# Patient Record
Sex: Female | Born: 1970 | Race: Black or African American | Hispanic: No | Marital: Single | Smoking: Never smoker
Health system: Southern US, Community
[De-identification: ages and names within clinical notes are randomized; demographics above are authoritative.]

## PROBLEM LIST (undated history)

## (undated) DIAGNOSIS — D500 Iron deficiency anemia secondary to blood loss (chronic): Secondary | ICD-10-CM

## (undated) DIAGNOSIS — T8859XA Other complications of anesthesia, initial encounter: Secondary | ICD-10-CM

## (undated) DIAGNOSIS — M7502 Adhesive capsulitis of left shoulder: Secondary | ICD-10-CM

## (undated) DIAGNOSIS — Z87442 Personal history of urinary calculi: Secondary | ICD-10-CM

## (undated) DIAGNOSIS — D689 Coagulation defect, unspecified: Secondary | ICD-10-CM

## (undated) DIAGNOSIS — W3400XA Accidental discharge from unspecified firearms or gun, initial encounter: Secondary | ICD-10-CM

## (undated) DIAGNOSIS — I2699 Other pulmonary embolism without acute cor pulmonale: Secondary | ICD-10-CM

## (undated) DIAGNOSIS — S31139A Puncture wound of abdominal wall without foreign body, unspecified quadrant without penetration into peritoneal cavity, initial encounter: Secondary | ICD-10-CM

## (undated) DIAGNOSIS — E119 Type 2 diabetes mellitus without complications: Secondary | ICD-10-CM

## (undated) HISTORY — DX: Accidental discharge from unspecified firearms or gun, initial encounter: W34.00XA

## (undated) HISTORY — DX: Type 2 diabetes mellitus without complications: E11.9

## (undated) HISTORY — DX: Iron deficiency anemia secondary to blood loss (chronic): D50.0

## (undated) HISTORY — DX: Puncture wound of abdominal wall without foreign body, unspecified quadrant without penetration into peritoneal cavity, initial encounter: S31.139A

## (undated) HISTORY — DX: Coagulation defect, unspecified: D68.9

---
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
## 1997-10-23 ENCOUNTER — Emergency Department (HOSPITAL_COMMUNITY): Admission: EM | Admit: 1997-10-23 | Discharge: 1997-10-23 | Payer: Self-pay | Admitting: Emergency Medicine

## 1997-11-06 ENCOUNTER — Other Ambulatory Visit: Admission: RE | Admit: 1997-11-06 | Discharge: 1997-11-06 | Payer: Self-pay | Admitting: Gynecology

## 1998-12-17 ENCOUNTER — Other Ambulatory Visit: Admission: RE | Admit: 1998-12-17 | Discharge: 1998-12-17 | Payer: Self-pay | Admitting: Gynecology

## 1999-12-29 ENCOUNTER — Other Ambulatory Visit: Admission: RE | Admit: 1999-12-29 | Discharge: 1999-12-29 | Payer: Self-pay | Admitting: Gynecology

## 2001-01-16 ENCOUNTER — Other Ambulatory Visit: Admission: RE | Admit: 2001-01-16 | Discharge: 2001-01-16 | Payer: Self-pay | Admitting: Gynecology

## 2001-09-19 ENCOUNTER — Encounter: Admission: RE | Admit: 2001-09-19 | Discharge: 2001-09-19 | Payer: Self-pay | Admitting: Internal Medicine

## 2001-09-19 ENCOUNTER — Encounter: Payer: Self-pay | Admitting: Internal Medicine

## 2002-02-26 ENCOUNTER — Other Ambulatory Visit: Admission: RE | Admit: 2002-02-26 | Discharge: 2002-02-26 | Payer: Self-pay | Admitting: Gynecology

## 2002-07-02 ENCOUNTER — Other Ambulatory Visit: Admission: RE | Admit: 2002-07-02 | Discharge: 2002-07-02 | Payer: Self-pay | Admitting: Gynecology

## 2003-01-04 ENCOUNTER — Other Ambulatory Visit: Admission: RE | Admit: 2003-01-04 | Discharge: 2003-01-04 | Payer: Self-pay | Admitting: Gynecology

## 2003-05-15 ENCOUNTER — Other Ambulatory Visit: Admission: RE | Admit: 2003-05-15 | Discharge: 2003-05-15 | Payer: Self-pay | Admitting: Gynecology

## 2004-07-23 ENCOUNTER — Other Ambulatory Visit: Admission: RE | Admit: 2004-07-23 | Discharge: 2004-07-23 | Payer: Self-pay | Admitting: Gynecology

## 2005-08-12 ENCOUNTER — Other Ambulatory Visit: Admission: RE | Admit: 2005-08-12 | Discharge: 2005-08-12 | Payer: Self-pay | Admitting: Gynecology

## 2005-12-30 ENCOUNTER — Encounter: Admission: RE | Admit: 2005-12-30 | Discharge: 2005-12-30 | Payer: Self-pay | Admitting: Internal Medicine

## 2006-08-24 ENCOUNTER — Other Ambulatory Visit: Admission: RE | Admit: 2006-08-24 | Discharge: 2006-08-24 | Payer: Self-pay | Admitting: Obstetrics and Gynecology

## 2007-08-31 ENCOUNTER — Other Ambulatory Visit: Admission: RE | Admit: 2007-08-31 | Discharge: 2007-08-31 | Payer: Self-pay | Admitting: Obstetrics and Gynecology

## 2008-01-25 ENCOUNTER — Encounter: Admission: RE | Admit: 2008-01-25 | Discharge: 2008-01-25 | Payer: Self-pay | Admitting: Internal Medicine

## 2008-03-28 ENCOUNTER — Ambulatory Visit (HOSPITAL_COMMUNITY): Admission: RE | Admit: 2008-03-28 | Discharge: 2008-03-29 | Payer: Self-pay | Admitting: General Surgery

## 2008-03-28 ENCOUNTER — Encounter (INDEPENDENT_AMBULATORY_CARE_PROVIDER_SITE_OTHER): Payer: Self-pay | Admitting: General Surgery

## 2008-03-28 HISTORY — PX: LAPAROSCOPIC CHOLECYSTECTOMY: SUR755

## 2008-10-10 ENCOUNTER — Ambulatory Visit: Payer: Self-pay | Admitting: Obstetrics and Gynecology

## 2008-10-10 ENCOUNTER — Encounter: Payer: Self-pay | Admitting: Obstetrics and Gynecology

## 2008-10-10 ENCOUNTER — Other Ambulatory Visit: Admission: RE | Admit: 2008-10-10 | Discharge: 2008-10-10 | Payer: Self-pay | Admitting: Obstetrics and Gynecology

## 2009-10-13 ENCOUNTER — Other Ambulatory Visit: Admission: RE | Admit: 2009-10-13 | Discharge: 2009-10-13 | Payer: Self-pay | Admitting: Obstetrics and Gynecology

## 2009-10-13 ENCOUNTER — Ambulatory Visit: Payer: Self-pay | Admitting: Obstetrics and Gynecology

## 2010-06-09 LAB — CBC
HCT: 34.8 % — ABNORMAL LOW (ref 36.0–46.0)
Hemoglobin: 11.5 g/dL — ABNORMAL LOW (ref 12.0–15.0)
MCHC: 32.9 g/dL (ref 30.0–36.0)
MCV: 88.1 fL (ref 78.0–100.0)
Platelets: 236 10*3/uL (ref 150–400)
RBC: 3.95 MIL/uL (ref 3.87–5.11)
RDW: 13 % (ref 11.5–15.5)
WBC: 3 10*3/uL — ABNORMAL LOW (ref 4.0–10.5)

## 2010-06-09 LAB — DIFFERENTIAL
Basophils Absolute: 0 10*3/uL (ref 0.0–0.1)
Basophils Relative: 1 % (ref 0–1)
Eosinophils Absolute: 0.1 10*3/uL (ref 0.0–0.7)
Eosinophils Relative: 3 % (ref 0–5)
Lymphocytes Relative: 41 % (ref 12–46)
Lymphs Abs: 1.2 10*3/uL (ref 0.7–4.0)
Monocytes Absolute: 0.3 10*3/uL (ref 0.1–1.0)
Monocytes Relative: 9 % (ref 3–12)
Neutro Abs: 1.4 10*3/uL — ABNORMAL LOW (ref 1.7–7.7)
Neutrophils Relative %: 47 % (ref 43–77)

## 2010-06-09 LAB — COMPREHENSIVE METABOLIC PANEL
ALT: 12 U/L (ref 0–35)
AST: 16 U/L (ref 0–37)
Albumin: 3.7 g/dL (ref 3.5–5.2)
Alkaline Phosphatase: 89 U/L (ref 39–117)
BUN: 7 mg/dL (ref 6–23)
CO2: 27 mEq/L (ref 19–32)
Calcium: 9.4 mg/dL (ref 8.4–10.5)
Chloride: 110 mEq/L (ref 96–112)
Creatinine, Ser: 1.05 mg/dL (ref 0.4–1.2)
GFR calc Af Amer: >60 mL/min (ref >60)
GFR calc non Af Amer: 59 mL/min — ABNORMAL LOW (ref >60)
Glucose, Bld: 85 mg/dL (ref 70–99)
Potassium: 3.9 mEq/L (ref 3.5–5.1)
Sodium: 142 mEq/L (ref 135–145)
Total Bilirubin: 0.3 mg/dL (ref 0.3–1.2)
Total Protein: 6.7 g/dL (ref 6.0–8.3)

## 2010-06-09 LAB — PREGNANCY, URINE: Preg Test, Ur: NEGATIVE
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
## 2010-11-30 ENCOUNTER — Inpatient Hospital Stay (INDEPENDENT_AMBULATORY_CARE_PROVIDER_SITE_OTHER)
Admission: RE | Admit: 2010-11-30 | Discharge: 2010-11-30 | Disposition: A | Discharge status: Home / Self Care | Payer: Self-pay | Source: Ambulatory Visit | Attending: Family Medicine | Admitting: Family Medicine

## 2010-11-30 DIAGNOSIS — J069 Acute upper respiratory infection, unspecified: Secondary | ICD-10-CM

## 2011-09-28 ENCOUNTER — Other Ambulatory Visit (HOSPITAL_COMMUNITY): Payer: Self-pay | Admitting: Internal Medicine

## 2011-09-28 DIAGNOSIS — Z1231 Encounter for screening mammogram for malignant neoplasm of breast: Secondary | ICD-10-CM

## 2011-10-01 ENCOUNTER — Ambulatory Visit (HOSPITAL_COMMUNITY)
Admission: RE | Admit: 2011-10-01 | Discharge: 2011-10-01 | Disposition: A | Discharge status: Home / Self Care | Payer: Self-pay | Source: Ambulatory Visit | Attending: Internal Medicine | Admitting: Internal Medicine

## 2011-10-01 DIAGNOSIS — Z1231 Encounter for screening mammogram for malignant neoplasm of breast: Secondary | ICD-10-CM

## 2012-08-03 ENCOUNTER — Encounter: Payer: Self-pay | Admitting: Women's Health

## 2012-08-03 ENCOUNTER — Other Ambulatory Visit (HOSPITAL_COMMUNITY)
Admission: RE | Admit: 2012-08-03 | Discharge: 2012-08-03 | Disposition: A | Discharge status: Home / Self Care | Payer: Self-pay | Source: Ambulatory Visit | Attending: Gynecology | Admitting: Gynecology

## 2012-08-03 ENCOUNTER — Other Ambulatory Visit: Payer: Self-pay | Admitting: Women's Health

## 2012-08-03 ENCOUNTER — Ambulatory Visit (INDEPENDENT_AMBULATORY_CARE_PROVIDER_SITE_OTHER): Payer: No Typology Code available for payment source | Admitting: Women's Health

## 2012-08-03 VITALS — BP 116/72 | Ht 66.0 in | Wt 203.0 lb

## 2012-08-03 DIAGNOSIS — Z833 Family history of diabetes mellitus: Secondary | ICD-10-CM

## 2012-08-03 DIAGNOSIS — N870 Mild cervical dysplasia: Secondary | ICD-10-CM

## 2012-08-03 DIAGNOSIS — Z01419 Encounter for gynecological examination (general) (routine) without abnormal findings: Secondary | ICD-10-CM | POA: Insufficient documentation

## 2012-08-03 DIAGNOSIS — N852 Hypertrophy of uterus: Secondary | ICD-10-CM

## 2012-08-03 DIAGNOSIS — Z13220 Encounter for screening for lipoid disorders: Secondary | ICD-10-CM

## 2012-08-03 LAB — CBC WITH DIFFERENTIAL/PLATELET
Basophils Absolute: 0 10*3/uL (ref 0.0–0.1)
Basophils Relative: 1 % (ref 0–1)
Eosinophils Absolute: 0.1 10*3/uL (ref 0.0–0.7)
Eosinophils Relative: 2 % (ref 0–5)
HCT: 32.6 % — ABNORMAL LOW (ref 36.0–46.0)
Hemoglobin: 10.4 g/dL — ABNORMAL LOW (ref 12.0–15.0)
Lymphocytes Relative: 32 % (ref 12–46)
Lymphs Abs: 1.3 10*3/uL (ref 0.7–4.0)
MCH: 26.7 pg (ref 26.0–34.0)
MCHC: 31.9 g/dL (ref 30.0–36.0)
MCV: 83.6 fL (ref 78.0–100.0)
Monocytes Absolute: 0.3 10*3/uL (ref 0.1–1.0)
Monocytes Relative: 8 % (ref 3–12)
Neutro Abs: 2.3 10*3/uL (ref 1.7–7.7)
Neutrophils Relative %: 57 % (ref 43–77)
Platelets: 311 10*3/uL (ref 150–400)
RBC: 3.9 MIL/uL (ref 3.87–5.11)
RDW: 15.4 % (ref 11.5–15.5)
WBC: 3.9 10*3/uL — ABNORMAL LOW (ref 4.0–10.5)

## 2012-08-03 NOTE — Patient Instructions (Signed)
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
## 2012-08-03 NOTE — Addendum Note (Signed)
Addended by: Dayna Barker on: 08/03/2012 10:34 AM   Modules accepted: Orders

## 2012-08-04 ENCOUNTER — Other Ambulatory Visit: Payer: Self-pay | Admitting: Women's Health

## 2012-08-04 DIAGNOSIS — Z833 Family history of diabetes mellitus: Secondary | ICD-10-CM

## 2012-08-04 DIAGNOSIS — D649 Anemia, unspecified: Secondary | ICD-10-CM

## 2012-08-04 LAB — URINALYSIS W MICROSCOPIC + REFLEX CULTURE
Bacteria, UA: NONE SEEN
Bilirubin Urine: NEGATIVE
Casts: NONE SEEN
Crystals: NONE SEEN
Glucose, UA: NEGATIVE mg/dL
Ketones, ur: NEGATIVE mg/dL
Nitrite: NEGATIVE
Protein, ur: NEGATIVE mg/dL
Specific Gravity, Urine: 1.025 (ref 1.005–1.030)
Urobilinogen, UA: 0.2 mg/dL (ref 0.0–1.0)
pH: 6 (ref 5.0–8.0)

## 2012-08-04 LAB — HEMOGLOBIN A1C
Hgb A1c MFr Bld: 5.9 % — ABNORMAL HIGH (ref <5.7)
Mean Plasma Glucose: 123 mg/dL — ABNORMAL HIGH (ref <117)

## 2012-08-04 LAB — LIPID PANEL
Cholesterol: 149 mg/dL (ref 0–200)
HDL: 50 mg/dL (ref >39)
LDL Cholesterol: 81 mg/dL (ref 0–99)
Total CHOL/HDL Ratio: 3 Ratio
Triglycerides: 91 mg/dL (ref <150)
VLDL: 18 mg/dL (ref 0–40)

## 2012-08-04 LAB — GLUCOSE, RANDOM: Glucose, Bld: 131 mg/dL — ABNORMAL HIGH (ref 70–99)

## 2012-08-05 LAB — URINE CULTURE: Colony Count: 70000

## 2012-08-16 ENCOUNTER — Encounter: Payer: Self-pay | Admitting: Obstetrics and Gynecology

## 2012-09-05 ENCOUNTER — Other Ambulatory Visit: Payer: Self-pay | Admitting: Women's Health

## 2012-09-05 DIAGNOSIS — Z1231 Encounter for screening mammogram for malignant neoplasm of breast: Secondary | ICD-10-CM

## 2012-09-20 ENCOUNTER — Ambulatory Visit (INDEPENDENT_AMBULATORY_CARE_PROVIDER_SITE_OTHER): Payer: No Typology Code available for payment source | Admitting: Women's Health

## 2012-09-20 ENCOUNTER — Encounter: Payer: Self-pay | Admitting: Women's Health

## 2012-09-20 ENCOUNTER — Ambulatory Visit (INDEPENDENT_AMBULATORY_CARE_PROVIDER_SITE_OTHER): Payer: No Typology Code available for payment source

## 2012-09-20 DIAGNOSIS — D251 Intramural leiomyoma of uterus: Secondary | ICD-10-CM

## 2012-09-20 DIAGNOSIS — N839 Noninflammatory disorder of ovary, fallopian tube and broad ligament, unspecified: Secondary | ICD-10-CM

## 2012-09-20 DIAGNOSIS — N852 Hypertrophy of uterus: Secondary | ICD-10-CM

## 2012-09-20 DIAGNOSIS — N8300 Follicular cyst of ovary, unspecified side: Secondary | ICD-10-CM

## 2012-09-20 DIAGNOSIS — D259 Leiomyoma of uterus, unspecified: Secondary | ICD-10-CM

## 2012-09-20 DIAGNOSIS — N920 Excessive and frequent menstruation with regular cycle: Secondary | ICD-10-CM

## 2012-09-20 DIAGNOSIS — R9389 Abnormal findings on diagnostic imaging of other specified body structures: Secondary | ICD-10-CM

## 2012-09-20 NOTE — Progress Notes (Signed)
Patient ID: Marissa [name], female   DOB: [birth year], 42 y.o.   MRN: [id number] Presents for ultrasound. At annual exam complaint of menorrhagia. Denies bleeding between cycles.  Ultrasound: Enlarged uterus with intramural fibroids 15 x 7 mm, 15 x 14 mm. Endometrium try layered with solid focus 15 x 7 mm vascular. Right ovary normal. Left ovary thin walled septated cystic mass 31 x 23 x 27. Septum 3 mm. Negative cul-de-sac.  Probable endometrial polyp Left ovarian cyst Uterine fibroids  Plan: Schedule sonohysterogram with Dr. Lily Peer after next cycle. Continue healthy lifestyle changes of walking and decreasing calories for weight loss.

## 2012-09-20 NOTE — Patient Instructions (Addendum)

## 2012-09-25 ENCOUNTER — Emergency Department (INDEPENDENT_AMBULATORY_CARE_PROVIDER_SITE_OTHER)
Admission: EM | Admit: 2012-09-25 | Discharge: 2012-09-25 | Disposition: A | Discharge status: Home / Self Care | Payer: No Typology Code available for payment source | Source: Home / Self Care

## 2012-09-25 ENCOUNTER — Encounter (HOSPITAL_COMMUNITY): Payer: Self-pay | Admitting: Emergency Medicine

## 2012-09-25 DIAGNOSIS — M545 Low back pain, unspecified: Secondary | ICD-10-CM

## 2012-09-25 MED ORDER — MELOXICAM 15 MG PO TABS: 15.0000 mg | ORAL_TABLET | Freq: Every day | ORAL | Status: DC | PRN

## 2012-09-25 MED ORDER — CYCLOBENZAPRINE HCL 10 MG PO TABS: 10.0000 mg | ORAL_TABLET | Freq: Every evening | ORAL | Status: DC | PRN

## 2012-09-25 NOTE — ED Notes (Signed)
Reports back pain since 09/22/12.  Patient reports she was in the shower and sneezed.  She felt immediate pain.  Pain in center low back radiating into right hip and top of thigh.  Pain worsening. Patient reports a fall in the winter, but no back issues like this.

## 2012-09-25 NOTE — ED Provider Notes (Signed)
Marissa [name] is a 42 y.o. female who presents to Urgent Care today for right low back pain occurring for the last several days. The pain developed after a violent sneeze. She notes pain is in her right lower back and does not radiate beyond her lateral thigh. She denies any weakness numbness pain radiating below the level of her knee trouble walking or bowel bladder dysfunction. She has tried some ibuprofen which is only been somewhat helpful. She feels well otherwise.    PMH reviewed. Healthy otherwise History  Substance Use Topics  . Smoking status: Never Smoker   . Smokeless tobacco: Not on file  . Alcohol Use: No   ROS as above no fevers chills nausea vomiting or diarrhea Medications reviewed. No current facility-administered medications for this encounter.   Current Outpatient Prescriptions  Medication Sig Dispense Refill  . cyclobenzaprine (FLEXERIL) 10 MG tablet Take 1 tablet (10 mg total) by mouth at bedtime as needed for muscle spasms.  20 tablet  0  . meloxicam (MOBIC) 15 MG tablet Take 1 tablet (15 mg total) by mouth daily as needed for pain.  30 tablet  0    Exam:  BP 120/78  Pulse 77  Temp(Src) 98.6 F (37 C) (Oral)  Resp 18  SpO2 100%  LMP 09/08/2012 Gen: Well NAD HEENT: EOMI,  MMM Lungs: CTABL Nl WOB Heart: RRR no MRG Abd: NABS, NT, ND Exts: Non edematous BL  LE, warm and well perfused.  Back: Nontender spinal midline.  Tender palpation right lumbar paraspinal muscles. Range of motion is decreased flexion normal extension. Negative straight leg raise test capillary refill and sensation are intact bilateral lower extremity. Strength is intact patient can stand on her toes and heels and get on and off exam table by herself. She has a normal gait.  No results found for this or any previous visit (from the past 24 hour(s)). No results found.  Assessment and Plan: 42 y.o. female with lumbar myofascial pain.  Plan to treat symptomatically with meloxicam and  Flexeril. We'll use a heating pad as well. Home exercise program. If not improving will followup with Dr. Katrinka Blazing. Discussed warning signs or symptoms. Please see discharge instructions. Patient expresses understanding.      Rodolph Bong, MD 09/25/12 1145

## 2012-09-26 ENCOUNTER — Other Ambulatory Visit: Payer: Self-pay | Admitting: Gynecology

## 2012-09-26 DIAGNOSIS — N920 Excessive and frequent menstruation with regular cycle: Secondary | ICD-10-CM

## 2012-10-05 ENCOUNTER — Ambulatory Visit (HOSPITAL_COMMUNITY): Payer: Self-pay

## 2012-10-05 ENCOUNTER — Ambulatory Visit (HOSPITAL_COMMUNITY)
Admission: RE | Admit: 2012-10-05 | Discharge: 2012-10-05 | Disposition: A | Discharge status: Home / Self Care | Payer: No Typology Code available for payment source | Source: Ambulatory Visit | Attending: Women's Health | Admitting: Women's Health

## 2012-10-05 DIAGNOSIS — Z1231 Encounter for screening mammogram for malignant neoplasm of breast: Secondary | ICD-10-CM

## 2012-10-19 ENCOUNTER — Other Ambulatory Visit: Payer: Self-pay | Admitting: Gynecology

## 2012-10-19 ENCOUNTER — Ambulatory Visit (INDEPENDENT_AMBULATORY_CARE_PROVIDER_SITE_OTHER): Payer: No Typology Code available for payment source | Admitting: Gynecology

## 2012-10-19 ENCOUNTER — Ambulatory Visit (INDEPENDENT_AMBULATORY_CARE_PROVIDER_SITE_OTHER): Payer: No Typology Code available for payment source

## 2012-10-19 DIAGNOSIS — D259 Leiomyoma of uterus, unspecified: Secondary | ICD-10-CM

## 2012-10-19 DIAGNOSIS — O9989 Other specified diseases and conditions complicating pregnancy, childbirth and the puerperium: Secondary | ICD-10-CM

## 2012-10-19 DIAGNOSIS — N920 Excessive and frequent menstruation with regular cycle: Secondary | ICD-10-CM

## 2012-10-19 DIAGNOSIS — Z833 Family history of diabetes mellitus: Secondary | ICD-10-CM

## 2012-10-19 DIAGNOSIS — O3411 Maternal care for benign tumor of corpus uteri, first trimester: Secondary | ICD-10-CM

## 2012-10-19 DIAGNOSIS — O3410 Maternal care for benign tumor of corpus uteri, unspecified trimester: Secondary | ICD-10-CM

## 2012-10-19 DIAGNOSIS — N912 Amenorrhea, unspecified: Secondary | ICD-10-CM

## 2012-10-19 DIAGNOSIS — Z3490 Encounter for supervision of normal pregnancy, unspecified, unspecified trimester: Secondary | ICD-10-CM | POA: Insufficient documentation

## 2012-10-19 DIAGNOSIS — O09529 Supervision of elderly multigravida, unspecified trimester: Secondary | ICD-10-CM

## 2012-10-19 DIAGNOSIS — D649 Anemia, unspecified: Secondary | ICD-10-CM

## 2012-10-19 LAB — CBC WITH DIFFERENTIAL/PLATELET
Basophils Absolute: 0 10*3/uL (ref 0.0–0.1)
Basophils Relative: 0 % (ref 0–1)
Eosinophils Absolute: 0.1 10*3/uL (ref 0.0–0.7)
Eosinophils Relative: 2 % (ref 0–5)
HCT: 30.4 % — ABNORMAL LOW (ref 36.0–46.0)
Hemoglobin: 9.8 g/dL — ABNORMAL LOW (ref 12.0–15.0)
Lymphocytes Relative: 26 % (ref 12–46)
Lymphs Abs: 1.3 10*3/uL (ref 0.7–4.0)
MCH: 26.9 pg (ref 26.0–34.0)
MCHC: 32.2 g/dL (ref 30.0–36.0)
MCV: 83.5 fL (ref 78.0–100.0)
Monocytes Absolute: 0.5 10*3/uL (ref 0.1–1.0)
Monocytes Relative: 9 % (ref 3–12)
Neutro Abs: 3.3 10*3/uL (ref 1.7–7.7)
Neutrophils Relative %: 63 % (ref 43–77)
Platelets: 314 10*3/uL (ref 150–400)
RBC: 3.64 MIL/uL — ABNORMAL LOW (ref 3.87–5.11)
RDW: 16.2 % — ABNORMAL HIGH (ref 11.5–15.5)
WBC: 5.2 10*3/uL (ref 4.0–10.5)

## 2012-10-19 LAB — US OB TRANSVAGINAL

## 2012-10-19 LAB — PREGNANCY, URINE: Preg Test, Ur: POSITIVE

## 2012-10-19 NOTE — Progress Notes (Signed)
Patient is a 42 year old now gravida 1 para 0 who was seen in the office on June 22 for annual exam and was complaining of heavy periods. She had been using condoms. She return to the office on July 30 where an ultrasound was done as a result of her menorrhagia and was found to have a left ovarian cyst measuring 31 x 23 x 27 mm with a 3 mm septum. A solid focus measuring 15 x 7 mm with vascularity was noted.   Patient did a home pregnancy test which was positive and one was done here in the office today which confirmed that she indeed is pregnant. This visit was previously scheduled for a sonohysterogram for further evaluation of her menorrhagia and it appeared that she may have had a submucous myoma. Surprisingly she is pregnant so the sonohysterogram was not done. Patient denied any nausea vomiting or any vaginal bleeding. Patient currently on no medication and has a negative medical history. She is on no medication.  Ultrasound today: Anteverted uterus with intrauterine pregnancy seen within the uterine cavity. Gestational sac consistent with 5 weeks and 2 days. There was no evidence of a fetal pole, cardiac activity or yolk sac. Right ovary was normal. Left ovarian corpus luteum cyst was noted measuring  20 x 27 [email or web address] fibroids as follows left 16 x 15 mm, 21 x 24 mm. There was no free fluid in the cul-de-sac.  Assessment/plan: Advanced maternal age first pregnancy first trimester early gestational sac no fetal pole, cardiac activity or yolk sac seen. Patient asymptomatic. Quantitative beta hCG will be drawn today and then repeat in 1 week and on September 11 to return back for an ultrasound for viability. She will be started on prenatal vitamins today. The risk involved in an advanced maternal age were discussed with the patient to include colon aneuploidy, hypertension, diabetes, premature delivery, as well as intrauterine growth restriction.

## 2012-10-19 NOTE — Patient Instructions (Addendum)

## 2012-10-20 LAB — HCG, QUANTITATIVE, PREGNANCY: hCG, Beta Chain, Quant, S: 9216.6 m[IU]/mL

## 2012-10-20 LAB — HEMOGLOBIN A1C
Hgb A1c MFr Bld: 6 % — ABNORMAL HIGH (ref <5.7)
Mean Plasma Glucose: 126 mg/dL — ABNORMAL HIGH (ref <117)

## 2012-11-01 ENCOUNTER — Ambulatory Visit (INDEPENDENT_AMBULATORY_CARE_PROVIDER_SITE_OTHER): Payer: No Typology Code available for payment source | Admitting: Gynecology

## 2012-11-01 ENCOUNTER — Encounter: Payer: Self-pay | Admitting: Gynecology

## 2012-11-01 VITALS — BP 128/76

## 2012-11-01 DIAGNOSIS — N912 Amenorrhea, unspecified: Secondary | ICD-10-CM

## 2012-11-01 DIAGNOSIS — O200 Threatened abortion: Secondary | ICD-10-CM | POA: Insufficient documentation

## 2012-11-01 DIAGNOSIS — Z3490 Encounter for supervision of normal pregnancy, unspecified, unspecified trimester: Secondary | ICD-10-CM

## 2012-11-01 NOTE — Patient Instructions (Signed)
Threatened Miscarriage  Bleeding during the first 20 weeks of pregnancy is common. This is sometimes called a threatened miscarriage. This is a pregnancy that is threatening to end before the twentieth week of pregnancy. Often this bleeding stops with bed rest or decreased activities as suggested by your caregiver and the pregnancy continues without any more problems. You may be asked to not have sexual intercourse, have orgasms or use tampons until further notice. Sometimes a threatened miscarriage can progress to a complete or incomplete miscarriage. This may or may not require further treatment. Some miscarriages occur before a woman misses a menstrual period and knows she is pregnant.  Miscarriages occur in 15 to 20% of all pregnancies and usually occur during the first 13 weeks of the pregnancy. The exact cause of a miscarriage is usually never known. A miscarriage is natures way of ending a pregnancy that is abnormal or would not make it to term. There are some things that may put you at risk to have a miscarriage, such as:  · Hormone problems.  · Infection of the uterus or cervix.  · Chronic illness, diabetes for example, especially if it is not controlled.  · Abnormal shaped uterus.  · Fibroids in the uterus.  · Incompetent cervix (the cervix is too weak to hold the baby).  · Smoking.  · Drinking too much alcohol. It's best not to drink any alcohol when you are pregnant.  · Taking illegal drugs.  TREATMENT   When a miscarriage becomes complete and all products of conception (all the tissue in the uterus) have been passed, often no treatment is needed. If you think you passed tissue, save it in a container and take it to your doctor for evaluation. If the miscarriage is incomplete (parts of the fetus or placenta remain in the uterus), further treatment may be needed. The most common reason for further treatment is continued bleeding (hemorrhage) because pregnancy tissue did not pass out of the uterus. This  often occurs if a miscarriage is incomplete. Tissue left behind may also become infected. Treatment usually is dilatation and curettage (the removal of the remaining products of pregnancy. This can be done by a simple sucking procedure (suction curettage) or a simple scraping of the inside of the uterus. This may be done in the hospital or in the caregiver's office. This is only done when your caregiver knows that there is no chance for the pregnancy to proceed to term. This is determined by physical examination, negative pregnancy test, falling pregnancy hormone count and/or, an ultrasound revealing a dead fetus.  Miscarriages are often a very emotional time for prospective mothers and fathers. This is not you or your partners fault. It did not occur because of an inadequacy in you or your partner. Nearly all miscarriages occur because the pregnancy has started off wrongly. At least half of these pregnancies have a chromosomal abnormality. It is almost always not inherited. Others may have developmental problems with the fetus or placenta. This does not always show up even when the products miscarried are studied under the microscope. The miscarriage is nearly always not your fault and it is not likely that you could have prevented it from happening. If you are having emotional and grieving problems, talk to your health care provider and even seek counseling, if necessary, before getting pregnant again. You can begin trying for another pregnancy as soon as your caregiver says it is OK.  HOME CARE INSTRUCTIONS   · Your caregiver may order   bed rest depending on how much bleeding and cramping you are having. You may be limited to only getting up to go to the bathroom. You may be allowed to continue light activity. You may need to make arrangements for the care of your other children and for any other responsibilities.  · Keep track of the number of pads you use each day, how often you have to change pads and how  saturated (soaked) they are. Record this information.  · DO NOT USE TAMPONS. Do not douche, have sexual intercourse or orgasms until approved by your caregiver.  · You may receive a follow up appointment for re-evaluation of your pregnancy and a repeat blood test. Re-evaluation often occurs after 2 days and again in 4 to 6 weeks. It is very important that you follow-up in the recommended time period.  · If you are Rh negative and the father is Rh positive or you do not know the fathers' blood type, you may receive a shot (Rh immune globulin) to help prevent abnormal antibodies that can develop and affect the baby in any future pregnancies.  SEEK IMMEDIATE MEDICAL CARE IF:  · You have severe cramps in your stomach, back, or abdomen.  · You have a sudden onset of severe pain in the lower part of your abdomen.  · You develop chills.  · You run an unexplained temperature of 101° F (38.3° C) or higher.  · You pass large clots or tissue. Save any tissue for your caregiver to inspect.  · Your bleeding increases or you become light-headed, weak, or have fainting episodes.  · You have a gush of fluid from your vagina.  · You pass out. This could mean you have a tubal (ectopic) pregnancy.  Document Released: 02/08/2005 Document Revised: 05/03/2011 Document Reviewed: 09/25/2007  Exit[employer]® Patient Information ©2014 [employer].

## 2012-11-01 NOTE — Progress Notes (Signed)
Patient is a 42 year old now gravida 1 para 0 who was seen in the office on June 22 for annual exam and was complaining of heavy periods. She had been using condoms. She return to the office on July 30 where an ultrasound was done as a result of her menorrhagia and was found to have a left ovarian cyst measuring 31 x 23 x 27 mm with a 3 mm septum. A solid focus measuring 15 x 7 mm with vascularity was noted.   Patient did a home pregnancy test which was positive and one Done in the office on 10/19/2012 which confirmed that she indeed is pregnant. This visit was previously scheduled for a sonohysterogram for further evaluation of her menorrhagia and it appeared that she may have had a submucous myoma. Surprisingly she was found to be pregnant so the sonohysterogram was not done. Patient denied any nausea vomiting or any vaginal bleeding. Patient currently on no medication and has a negative medical history. She is on no medication.   Ultrasound 10/19/2012: Anteverted uterus with intrauterine pregnancy seen within the uterine cavity. Gestational sac consistent with 5 weeks and 2 days. There was no evidence of a fetal pole, cardiac activity or yolk sac. Right ovary was normal. Left ovarian corpus luteum cyst was noted measuring 20 x 27 [email or web address] fibroids as follows left 16 x 15 mm, 21 x 24 mm. There was no free fluid in the cul-de-sac.   Patient presented to the office today stating that last night she noted some pinkish discharge when she was wiping. She denied any heavy bleeding or cramping. Some nausea but no vomiting.  Exam: Abdomen: Soft nontender no rebound or guarding Pelvic: And urethra Skene was within normal limits Vagina: No lesions slight amount of brownish discharge Cervix: Long closed with no lesions or discharge Uterus: 6-to 8 week size Adnexa: A viable mass or tenderness Rectal exam: Not done  Quantitative beta-hCG on 10/19/2012 with 9260 M IU  Assessment/plan: First trimester  threatened AB. Quantitative beta hCG will be drawn today. Patient for followup ultrasound next week. Early ultrasound confirmed gestational sac in utero. Threatened AB precautions provided.

## 2012-11-02 LAB — HCG, QUANTITATIVE, PREGNANCY: hCG, Beta Chain, Quant, S: 52948.3 m[IU]/mL

## 2012-11-09 ENCOUNTER — Ambulatory Visit (INDEPENDENT_AMBULATORY_CARE_PROVIDER_SITE_OTHER): Payer: No Typology Code available for payment source

## 2012-11-09 ENCOUNTER — Encounter: Payer: Self-pay | Admitting: Gynecology

## 2012-11-09 ENCOUNTER — Ambulatory Visit (INDEPENDENT_AMBULATORY_CARE_PROVIDER_SITE_OTHER): Payer: No Typology Code available for payment source | Admitting: Gynecology

## 2012-11-09 VITALS — BP 128/74

## 2012-11-09 DIAGNOSIS — N831 Corpus luteum cyst of ovary, unspecified side: Secondary | ICD-10-CM

## 2012-11-09 DIAGNOSIS — O200 Threatened abortion: Secondary | ICD-10-CM

## 2012-11-09 DIAGNOSIS — Z3490 Encounter for supervision of normal pregnancy, unspecified, unspecified trimester: Secondary | ICD-10-CM

## 2012-11-09 DIAGNOSIS — O09511 Supervision of elderly primigravida, first trimester: Secondary | ICD-10-CM

## 2012-11-09 DIAGNOSIS — O9989 Other specified diseases and conditions complicating pregnancy, childbirth and the puerperium: Secondary | ICD-10-CM

## 2012-11-09 DIAGNOSIS — N912 Amenorrhea, unspecified: Secondary | ICD-10-CM

## 2012-11-09 LAB — US OB TRANSVAGINAL

## 2012-11-09 NOTE — Progress Notes (Signed)
Patient is a 42 year old gravida 1 para 0 who was last seen in the office on September 10 when it was noted that she was pregnant and a sonohysterogram was canceled as part of her workup for menorrhagia and fibroid uterus.  Ultrasound 10/19/2012: Anteverted uterus with intrauterine pregnancy seen within the uterine cavity. Gestational sac consistent with 5 weeks and 2 days. There was no evidence of a fetal pole, cardiac activity or yolk sac. Right ovary was normal. Left ovarian corpus luteum cyst was noted measuring 20 x 27 [email or web address] fibroids as follows left 16 x 15 mm, 21 x 24 mm. There was no free fluid in the cul-de-sac.   Patient had a fall in quantitative beta-hCGs:  Results for Marissa, [name] (MRN [id number]) as of 11/09/2012 16:08  Ref. Range 10/19/2012 16:07 10/19/2012 16:20 11/01/2012 10:19  hCG, Beta Chain, Quant, S No range found 9216.6  52948.3   She presented to the office today for ultrasound followup. She has some mild nausea but otherwise been doing well. Ultrasound today as follows: Intrauterine pregnancy seen within the endometrial cavity. Gestational sac size less than dates by 2 weeks. Yolk sac was seen measuring 3 mm. Normal shape and size for gestational age. No cardiac activity seen and no fetal pole was seen. Right ovary normal. Left ovary corpus luteum cyst was seen. Cervix long and closed.  Assessment/plan: First trimester intrauterine pregnancy normal gestational sac and yolk sac no fetal pole but rising quantitative beta-hCGs. Patient by ultrasound today 6 weeks and 5 days. A quantitative beta hCG will be drawn today. Patient will return back to the office in 2 weeks for followup. She will continue on her prenatal vitamin.

## 2012-11-10 ENCOUNTER — Telehealth: Payer: Self-pay | Admitting: *Deleted

## 2012-11-10 LAB — HCG, QUANTITATIVE, PREGNANCY: hCG, Beta Chain, Quant, S: 63411.3 m[IU]/mL

## 2012-11-10 NOTE — Telephone Encounter (Signed)
Pt called requesting beta HCG result on 11/09/12, I told her you were out of the office and to keep scheduled ultrasound appointment on 11/22/12. Please advise

## 2012-11-13 NOTE — Telephone Encounter (Signed)
See result note.  

## 2012-11-22 ENCOUNTER — Encounter: Payer: Self-pay | Admitting: Gynecology

## 2012-11-22 ENCOUNTER — Ambulatory Visit (INDEPENDENT_AMBULATORY_CARE_PROVIDER_SITE_OTHER): Payer: No Typology Code available for payment source

## 2012-11-22 ENCOUNTER — Ambulatory Visit (INDEPENDENT_AMBULATORY_CARE_PROVIDER_SITE_OTHER): Payer: No Typology Code available for payment source | Admitting: Gynecology

## 2012-11-22 DIAGNOSIS — O200 Threatened abortion: Secondary | ICD-10-CM

## 2012-11-22 DIAGNOSIS — O021 Missed abortion: Secondary | ICD-10-CM

## 2012-11-22 DIAGNOSIS — O09521 Supervision of elderly multigravida, first trimester: Secondary | ICD-10-CM

## 2012-11-22 LAB — US OB TRANSVAGINAL

## 2012-11-22 LAB — HCG, QUANTITATIVE, PREGNANCY: hCG, Beta Chain, Quant, S: 30731.9 m[IU]/mL

## 2012-11-22 NOTE — Progress Notes (Signed)
Ultrasound 10/19/2012:  Anteverted uterus with intrauterine pregnancy seen within the uterine cavity. Gestational sac consistent with 5 weeks and 2 days. There was no evidence of a fetal pole, cardiac activity or yolk sac. Right ovary was normal. Left ovarian corpus luteum cyst was noted measuring 20 x 27 [email or web address] fibroids as follows left 16 x 15 mm, 21 x 24 mm. There was no free fluid in the cul-de-sac.   She return to the office for followup ultrasound on September 18 which demonstrated the following: Intrauterine pregnancy seen within the endometrial cavity. Gestational sac size less than dates by 2 weeks. Yolk sac was seen measuring 3 mm. Normal shape and size for gestational age. No cardiac activity seen and no fetal pole was seen. Right ovary normal. Left ovary corpus luteum cyst was seen. Cervix long and closed  She was diagnosed with first trimester threatened AB and had quantitative beta-hCGs as follows: Results for CHRISTOPHER, HINK (MRN [id number]) as of 11/22/2012 09:38  Ref. Range 10/19/2012 16:07 11/01/2012 10:19 11/09/2012 15:29  hCG, Beta Chain, Quant, S No range found 9216.6 52948.3 63411.3    Today she presented for an ultrasound once again the ultrasound with the following findings: Anteverted uterus with gestational sac seen in the fundus. Yolk sac was seen measuring 13.0 mm greater than the 95th percentile for gestational age. Fetal pole 2.9 consistent with 5 weeks and 5 days. No fetal cardiac activity was seen. Right ovary normal. Left ovary corpus luteum cyst was seen cervix is long closed.  Assessment/plan: Patient with first trimester threatened AB slowly rising quantitative beta-hCGs fetal pole previously not seen now visualize but no cardiac activity. We'll check a quantitative beta-hCG to monitor the trans-and if continues to right she will return for one final ultrasound in 2 weeks. Patient is otherwise doing well all the above was discussed and explained in detail and all  questions were answered. First trimester precautions were discussed as well.

## 2012-11-24 ENCOUNTER — Encounter (HOSPITAL_BASED_OUTPATIENT_CLINIC_OR_DEPARTMENT_OTHER): Payer: Self-pay | Admitting: *Deleted

## 2012-11-24 NOTE — Progress Notes (Signed)
NPO AFTER MN WITH EXCEPTION WATER / GATORADE UNTIL 0700.  ARRIVES AT 1100. NEEDS CBC AND UA. MAY TAKE TYLENOL IF NEEDED W/ SIPS OF WATER.
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
## 2012-11-27 ENCOUNTER — Ambulatory Visit (INDEPENDENT_AMBULATORY_CARE_PROVIDER_SITE_OTHER): Payer: No Typology Code available for payment source | Admitting: Gynecology

## 2012-11-27 ENCOUNTER — Encounter: Payer: Self-pay | Admitting: Gynecology

## 2012-11-27 VITALS — BP 124/76

## 2012-11-27 DIAGNOSIS — O021 Missed abortion: Secondary | ICD-10-CM

## 2012-11-27 MED ORDER — KETOROLAC TROMETHAMINE 10 MG PO TABS: 10.0000 mg | ORAL_TABLET | Freq: Four times a day (QID) | ORAL | Status: DC | PRN

## 2012-11-27 MED ORDER — METOCLOPRAMIDE HCL 10 MG PO TABS: 10.0000 mg | ORAL_TABLET | Freq: Three times a day (TID) | ORAL | Status: DC

## 2012-11-27 MED ORDER — IBUPROFEN 800 MG PO TABS: 800.0000 mg | ORAL_TABLET | Freq: Three times a day (TID) | ORAL | Status: DC | PRN

## 2012-11-27 NOTE — Patient Instructions (Signed)
Miscarriage A miscarriage is the sudden loss of an unborn baby (fetus) before the 20th week of pregnancy. Most miscarriages happen in the first 3 months of pregnancy. Sometimes, it happens before a woman even knows she is pregnant. A miscarriage is also called a "spontaneous miscarriage" or "early pregnancy loss." Having a miscarriage can be an emotional experience. Talk with your caregiver about any questions you may have about miscarrying, the grieving process, and your future pregnancy plans. CAUSES   Problems with the fetal chromosomes that make it impossible for the baby to develop normally. Problems with the baby's genes or chromosomes are most often the result of errors that occur, by chance, as the embryo divides and grows. The problems are not inherited from the parents.  Infection of the cervix or uterus.   Hormone problems.   Problems with the cervix, such as having an incompetent cervix. This is when the tissue in the cervix is not strong enough to hold the pregnancy.   Problems with the uterus, such as an abnormally shaped uterus, uterine fibroids, or congenital abnormalities.   Certain medical conditions.   Smoking, drinking alcohol, or taking illegal drugs.   Trauma.  Often, the cause of a miscarriage is unknown.  SYMPTOMS   Vaginal bleeding or spotting, with or without cramps or pain.  Pain or cramping in the abdomen or lower back.  Passing fluid, tissue, or blood clots from the vagina. DIAGNOSIS  Your caregiver will perform a physical exam. You may also have an ultrasound to confirm the miscarriage. Blood or urine tests may also be ordered. TREATMENT   Sometimes, treatment is not necessary if you naturally pass all the fetal tissue that was in the uterus. If some of the fetus or placenta remains in the body (incomplete miscarriage), tissue left behind may become infected and must be removed. Usually, a dilation and curettage (D and C) procedure is performed.  During a D and C procedure, the cervix is widened (dilated) and any remaining fetal or placental tissue is gently removed from the uterus.  Antibiotic medicines are prescribed if there is an infection. Other medicines may be given to reduce the size of the uterus (contract) if there is a lot of bleeding.  If you have Rh negative blood and your baby was Rh positive, you will need a Rh immunoglobulin shot. This shot will protect any future baby from having Rh blood problems in future pregnancies. HOME CARE INSTRUCTIONS   Your caregiver may order bed rest or may allow you to continue light activity. Resume activity as directed by your caregiver.  Have someone help with home and family responsibilities during this time.   Keep track of the number of sanitary pads you use each day and how soaked (saturated) they are. Write down this information.   Do not use tampons. Do not douche or have sexual intercourse until approved by your caregiver.   Only take over-the-counter or prescription medicines for pain or discomfort as directed by your caregiver.   Do not take aspirin. Aspirin can cause bleeding.   Keep all follow-up appointments with your caregiver.   If you or your partner have problems with grieving, talk to your caregiver or seek counseling to help cope with the pregnancy loss. Allow enough time to grieve before trying to get pregnant again.  SEEK IMMEDIATE MEDICAL CARE IF:   You have severe cramps or pain in your back or abdomen.  You have a fever.  You pass large blood clots (walnut-sized   or larger) ortissue from your vagina. Save any tissue for your caregiver to inspect.   Your bleeding increases.   You have a thick, bad-smelling vaginal discharge.  You become lightheaded, weak, or you faint.   You have chills.  MAKE SURE YOU:  Understand these instructions.  Will watch your condition.  Will get help right away if you are not doing well or get  worse. Document Released: 08/04/2000 Document Revised: 08/10/2011 Document Reviewed: 03/30/2011 Exit[employer] Patient Information 2014 [employer].  

## 2012-11-27 NOTE — Progress Notes (Signed)
RECEIVED CALL FROM DR Lily Peer OFFICE, ADD ABO/RH  AND T & S LAB WORK FOR DOS. ORDERS DONE.
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
## 2012-11-28 ENCOUNTER — Encounter (HOSPITAL_BASED_OUTPATIENT_CLINIC_OR_DEPARTMENT_OTHER): Payer: Self-pay | Admitting: Anesthesiology

## 2012-11-28 ENCOUNTER — Ambulatory Visit (HOSPITAL_BASED_OUTPATIENT_CLINIC_OR_DEPARTMENT_OTHER): Payer: No Typology Code available for payment source | Admitting: Anesthesiology

## 2012-11-28 ENCOUNTER — Ambulatory Visit (HOSPITAL_BASED_OUTPATIENT_CLINIC_OR_DEPARTMENT_OTHER)
Admission: RE | Admit: 2012-11-28 | Discharge: 2012-11-28 | Disposition: A | Discharge status: Home / Self Care | Payer: No Typology Code available for payment source | Source: Ambulatory Visit | Attending: Gynecology | Admitting: Gynecology

## 2012-11-28 ENCOUNTER — Encounter (HOSPITAL_BASED_OUTPATIENT_CLINIC_OR_DEPARTMENT_OTHER)
Admission: RE | Disposition: A | Discharge status: Home / Self Care | Payer: Self-pay | Source: Ambulatory Visit | Attending: Gynecology

## 2012-11-28 DIAGNOSIS — O021 Missed abortion: Secondary | ICD-10-CM | POA: Insufficient documentation

## 2012-11-28 DIAGNOSIS — E669 Obesity, unspecified: Secondary | ICD-10-CM | POA: Insufficient documentation

## 2012-11-28 DIAGNOSIS — Z9889 Other specified postprocedural states: Secondary | ICD-10-CM

## 2012-11-28 HISTORY — PX: DILATION AND EVACUATION: SHX1459

## 2012-11-28 LAB — URINALYSIS, ROUTINE W REFLEX MICROSCOPIC
Bilirubin Urine: NEGATIVE
Glucose, UA: NEGATIVE mg/dL
Ketones, ur: NEGATIVE mg/dL
Nitrite: NEGATIVE
Protein, ur: NEGATIVE mg/dL
Specific Gravity, Urine: 1.015 (ref 1.005–1.030)
Urobilinogen, UA: 1 mg/dL (ref 0.0–1.0)
pH: 6 (ref 5.0–8.0)

## 2012-11-28 LAB — TYPE AND SCREEN
ABO/RH(D): A POS
Antibody Screen: NEGATIVE

## 2012-11-28 LAB — CBC
HCT: 32.5 % — ABNORMAL LOW (ref 36.0–46.0)
Hemoglobin: 10.5 g/dL — ABNORMAL LOW (ref 12.0–15.0)
MCH: 28.1 pg (ref 26.0–34.0)
MCHC: 32.3 g/dL (ref 30.0–36.0)
MCV: 86.9 fL (ref 78.0–100.0)
Platelets: 275 10*3/uL (ref 150–400)
RBC: 3.74 MIL/uL — ABNORMAL LOW (ref 3.87–5.11)
RDW: 15.2 % (ref 11.5–15.5)
WBC: 3.8 10*3/uL — ABNORMAL LOW (ref 4.0–10.5)

## 2012-11-28 LAB — URINE MICROSCOPIC-ADD ON

## 2012-11-28 LAB — ABO/RH: ABO/RH(D): A POS

## 2012-11-28 SURGERY — Surgical Case
Anesthesia: *Unknown

## 2012-11-28 SURGERY — DILATION AND EVACUATION, UTERUS
Anesthesia: General | Site: Uterus | Wound class: Clean Contaminated

## 2012-11-28 MED ORDER — FENTANYL CITRATE 0.05 MG/ML IJ SOLN
INTRAMUSCULAR | Status: DC | PRN
  Administered 2012-11-28: 50 ug via INTRAVENOUS
  Administered 2012-11-28 (×2): 25 ug via INTRAVENOUS

## 2012-11-28 MED ORDER — DEXAMETHASONE SODIUM PHOSPHATE 4 MG/ML IJ SOLN
INTRAMUSCULAR | Status: DC | PRN
  Administered 2012-11-28: 10 mg via INTRAVENOUS

## 2012-11-28 MED ORDER — PROPOFOL 10 MG/ML IV BOLUS
INTRAVENOUS | Status: DC | PRN
  Administered 2012-11-28: 200 mg via INTRAVENOUS

## 2012-11-28 MED ORDER — LACTATED RINGERS IV SOLN
INTRAVENOUS | Status: DC
  Administered 2012-11-28 (×2): via INTRAVENOUS
  Filled 2012-11-28: qty 1000

## 2012-11-28 MED ORDER — ONDANSETRON HCL 4 MG/2ML IJ SOLN
INTRAMUSCULAR | Status: DC | PRN
  Administered 2012-11-28: 4 mg via INTRAMUSCULAR

## 2012-11-28 MED ORDER — KETOROLAC TROMETHAMINE 30 MG/ML IJ SOLN
15.0000 mg | Freq: Once | INTRAMUSCULAR | Status: DC | PRN
  Filled 2012-11-28: qty 1

## 2012-11-28 MED ORDER — DEXTROSE 5 % IV SOLN
2.0000 g | INTRAVENOUS | Status: AC
  Administered 2012-11-28: 2 g via INTRAVENOUS
  Filled 2012-11-28: qty 2

## 2012-11-28 MED ORDER — PROMETHAZINE HCL 25 MG/ML IJ SOLN
6.2500 mg | INTRAMUSCULAR | Status: DC | PRN
  Filled 2012-11-28: qty 1

## 2012-11-28 MED ORDER — MIDAZOLAM HCL 5 MG/5ML IJ SOLN
INTRAMUSCULAR | Status: DC | PRN
  Administered 2012-11-28: 2 mg via INTRAVENOUS

## 2012-11-28 MED ORDER — LIDOCAINE HCL (CARDIAC) 20 MG/ML IV SOLN
INTRAVENOUS | Status: DC | PRN
  Administered 2012-11-28: 60 mg via INTRAVENOUS

## 2012-11-28 MED ORDER — FENTANYL CITRATE 0.05 MG/ML IJ SOLN
25.0000 ug | INTRAMUSCULAR | Status: DC | PRN
  Filled 2012-11-28: qty 1

## 2012-11-28 MED ORDER — METHYLERGONOVINE MALEATE 0.2 MG/ML IJ SOLN
0.2000 mg | Freq: Once | INTRAMUSCULAR | Status: AC
  Administered 2012-11-28: 0.2 mg via INTRAMUSCULAR
  Filled 2012-11-28: qty 1

## 2012-11-28 SURGICAL SUPPLY — 25 items
CATH ROBINSON RED A/P 16FR (CATHETERS) IMPLANT
CLOTH BEACON ORANGE TIMEOUT ST (SAFETY) ×2 IMPLANT
DRAPE UNDERBUTTOCKS STRL (DRAPE) IMPLANT
DRESSING TELFA 8X3 (GAUZE/BANDAGES/DRESSINGS) IMPLANT
GLOVE ECLIPSE 7.5 STRL STRAW (GLOVE) ×2 IMPLANT
GLOVE INDICATOR 8.0 STRL GRN (GLOVE) ×2 IMPLANT
GOWN STRL NON-REIN LRG LVL3 (GOWN DISPOSABLE) ×2 IMPLANT
GOWN W/2 COTTON TOWELS 2 STD (GOWNS) IMPLANT
KIT BERKELEY 1ST TRIMESTER 3/8 (MISCELLANEOUS) IMPLANT
NEEDLE SPNL 22GX3.5 QUINCKE BK (NEEDLE) IMPLANT
NS IRRIG 500ML POUR BTL (IV SOLUTION) ×2 IMPLANT
PACK BASIN DAY SURGERY FS (CUSTOM PROCEDURE TRAY) ×2 IMPLANT
PAD OB MATERNITY 4.3X12.25 (PERSONAL CARE ITEMS) ×2 IMPLANT
PAD PREP 24X48 CUFFED NSTRL (MISCELLANEOUS) ×2 IMPLANT
SCOPETTES 8  STERILE (MISCELLANEOUS) ×1
SCOPETTES 8 STERILE (MISCELLANEOUS) ×1 IMPLANT
SET BERKELEY SUCTION TUBING (SUCTIONS) ×2 IMPLANT
SYR CONTROL 10ML LL (SYRINGE) IMPLANT
TOP DISP BERKELEY (MISCELLANEOUS) ×2 IMPLANT
TOWEL OR 17X24 6PK STRL BLUE (TOWEL DISPOSABLE) ×2 IMPLANT
TRAY DSU PREP LF (CUSTOM PROCEDURE TRAY) ×2 IMPLANT
VACURETTE 10 RIGID CVD (CANNULA) IMPLANT
VACURETTE 7MM CVD STRL WRAP (CANNULA) IMPLANT
VACURETTE 8 RIGID CVD (CANNULA) ×2 IMPLANT
VACURETTE 9 RIGID CVD (CANNULA) IMPLANT
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
## 2012-11-28 NOTE — Anesthesia Postprocedure Evaluation (Signed)
Anesthesia Post Note  Patient: Marissa [name]  Procedure(s) Performed: Procedure(s) (LRB): DILATATION AND EVACUATION (N/A)  Anesthesia type: General  Patient location: PACU  Post pain: Pain level controlled  Post assessment: Post-op Vital signs reviewed  Last Vitals: BP 119/70  Pulse 70  Temp(Src) 37.2 C (Oral)  Resp 20  Ht 5\' 6"  (1.676 m)  Wt 205 lb 8 oz (93.214 kg)  BMI 33.18 kg/m2  SpO2 100%  LMP 09/08/2012  Post vital signs: Reviewed  Level of consciousness: sedated  Complications: No apparent anesthesia complications

## 2012-11-28 NOTE — Transfer of Care (Signed)
Immediate Anesthesia Transfer of Care Note  Patient: Marissa [name]  Procedure(s) Performed: Procedure(s) (LRB): DILATATION AND EVACUATION (N/A)  Patient Location: PACU  Anesthesia Type: General  Level of Consciousness: awake, alert  and oriented  Airway & Oxygen Therapy: Patient Spontanous Breathing and Patient connected to face mask oxygen  Post-op Assessment: Report given to PACU RN and Post -op Vital signs reviewed and stable  Post vital signs: Reviewed and stable  Complications: No apparent anesthesia complications

## 2012-11-28 NOTE — Anesthesia Preprocedure Evaluation (Addendum)
Anesthesia Evaluation  Patient identified by MRN, date of birth, ID band Patient awake    Reviewed: Allergy & Precautions, H&P , NPO status , Patient's Chart, lab work & pertinent test results  Airway Mallampati: II TM Distance: >3 FB Neck ROM: Full    Dental no notable dental hx. (+) Dental Advisory Given   Pulmonary neg pulmonary ROS,  breath sounds clear to auscultation  Pulmonary exam normal       Cardiovascular negative cardio ROS  Rhythm:Regular Rate:Normal     Neuro/Psych negative neurological ROS  negative psych ROS   GI/Hepatic negative GI ROS, Neg liver ROS,   Endo/Other    Renal/GU negative Renal ROS     Musculoskeletal negative musculoskeletal ROS (+)   Abdominal (+) + obese,   Peds  Hematology negative hematology ROS (+)   Anesthesia Other Findings   Reproductive/Obstetrics negative OB ROS                          Anesthesia Physical Anesthesia Plan  ASA: II  Anesthesia Plan: General   Post-op Pain Management:    Induction: Intravenous  Airway Management Planned: LMA  Additional Equipment:   Intra-op Plan:   Post-operative Plan:   Informed Consent: I have reviewed the patients History and Physical, chart, labs and discussed the procedure including the risks, benefits and alternatives for the proposed anesthesia with the patient or authorized representative who has indicated his/her understanding and acceptance.   Dental advisory given  Plan Discussed with: CRNA and Surgeon  Anesthesia Plan Comments:         Anesthesia Quick Evaluation

## 2012-11-28 NOTE — Interval H&P Note (Signed)
History and Physical Interval Note:  11/28/2012 1:00 PM  Marissa [name]  has presented today for surgery, with the diagnosis of missed abortion  The various methods of treatment have been discussed with the patient and family. After consideration of risks, benefits and other options for treatment, the patient has consented to  Procedure(s): DILATATION AND EVACUATION (N/A) as a surgical intervention .  The patient's history has been reviewed, patient examined, no change in status, stable for surgery.  I have reviewed the patient's chart and labs.  Questions were answered to the patient's satisfaction.     Ok Edwards

## 2012-11-28 NOTE — Op Note (Signed)
11/28/2012  1:57 PM  PATIENT:  Marissa [name]  42 y.o. female  PRE-OPERATIVE DIAGNOSIS:  missed abortion first trimester  POST-OPERATIVE DIAGNOSIS:  missed abortion first trimester  PROCEDURE:  Procedure(s): DILATATION AND EVACUATION  SURGEON:  Surgeon(s): Ok Edwards, MD  ANESTHESIA:   general  FINDINGS: First trimester SAB with decreasing quantitative beta-hCG and  blood type A+. Uterus approximately 8 weeks size on exam under anesthesia.  DESCRIPTION OF OPERATION: The patient was taken to the operating room where she underwent successful general endotracheal anesthesia. A time out was undertaken to identify the patient and outlined the operation to be performed. Patient had PAS stockings for DVT prophylaxis and received 2 g of Cefotan for infection prophylaxis. The vagina and perineum were prepped and draped in the usual sterile fashion. The uterus is anteverted 8 week size. A single-tooth tenaculum was placed on the anterior cervical lip. Of note patient had voided before being taken back to the operating room her bladder was not isolated. The uterus sounded to approximately 9 cm. The cervical canal was serially dilated with a Pratt dilator to a size 27. An 8 mm suction curette was introduced into the uterine cavity to remove the products of conception. This was interchanged with a blunt curette to remove the remaining products of conception. The single-tooth tenaculum was removed. Patient received Methergine 0.2 mg IM as a uterotonic agents. Blood loss probably 75-100  cc. Patient was extubated transferred to recovery room stable vital signs.  ESTIMATED BLOOD LOSS: 75 cc's  Intake/Output Summary (Last 24 hours) at 11/28/12 1357 Last data filed at 11/28/12 1322  Gross per 24 hour  Intake    200 ml  Output      0 ml  Net    200 ml     BLOOD ADMINISTERED:none   LOCAL MEDICATIONS USED:  NONE  SPECIMEN:  Source of Specimen:  uterine  DISPOSITION OF SPECIMEN:   PATHOLOGY  COUNTS:  YES  PLAN OF CARE: Transfer to PACU  [facility] HMD1:57 PMTD@  Note: This dictation was prepared with  Dragon/digital dictation along withSmart phrase technology. Any transcriptional errors that result from this process are unintentional.

## 2012-11-28 NOTE — Anesthesia Procedure Notes (Signed)
Procedure Name: LMA Insertion Date/Time: 11/28/2012 1:28 PM Performed by: Norva Pavlov Pre-anesthesia Checklist: Patient identified, Emergency Drugs available, Suction available and Patient being monitored Patient Re-evaluated:Patient Re-evaluated prior to inductionOxygen Delivery Method: Circle System Utilized Preoxygenation: Pre-oxygenation with 100% oxygen Intubation Type: IV induction Ventilation: Mask ventilation without difficulty LMA: LMA inserted LMA Size: 4.0 Number of attempts: 1 Airway Equipment and Method: bite block Placement Confirmation: positive ETCO2 Tube secured with: Tape Dental Injury: Teeth and Oropharynx as per pre-operative assessment

## 2012-11-29 ENCOUNTER — Encounter (HOSPITAL_BASED_OUTPATIENT_CLINIC_OR_DEPARTMENT_OTHER): Payer: Self-pay | Admitting: Gynecology

## 2012-12-09 ENCOUNTER — Telehealth: Payer: Self-pay | Admitting: Gynecology

## 2012-12-09 NOTE — Telephone Encounter (Signed)
On Call Note:  Had D&C 11/28/2012.  Bled on and off since.  Episode clotting today with what looks like tissue attached to one clot.  Not bleeding heavy now.  No fevers or significant pain.  Appt already scheduled for next week.  Recommend observe for now with call back this weekend if heavy bleeding, fevers, pain.  OV Monday if bleeding continues.

## 2012-12-14 ENCOUNTER — Other Ambulatory Visit: Payer: Self-pay | Admitting: Gynecology

## 2012-12-14 ENCOUNTER — Encounter: Payer: Self-pay | Admitting: Gynecology

## 2012-12-14 ENCOUNTER — Ambulatory Visit (INDEPENDENT_AMBULATORY_CARE_PROVIDER_SITE_OTHER): Payer: No Typology Code available for payment source

## 2012-12-14 ENCOUNTER — Ambulatory Visit (INDEPENDENT_AMBULATORY_CARE_PROVIDER_SITE_OTHER): Payer: No Typology Code available for payment source | Admitting: Gynecology

## 2012-12-14 VITALS — BP 128/76

## 2012-12-14 DIAGNOSIS — N949 Unspecified condition associated with female genital organs and menstrual cycle: Secondary | ICD-10-CM

## 2012-12-14 DIAGNOSIS — O039 Complete or unspecified spontaneous abortion without complication: Secondary | ICD-10-CM

## 2012-12-14 DIAGNOSIS — R9389 Abnormal findings on diagnostic imaging of other specified body structures: Secondary | ICD-10-CM

## 2012-12-14 DIAGNOSIS — N938 Other specified abnormal uterine and vaginal bleeding: Secondary | ICD-10-CM

## 2012-12-14 DIAGNOSIS — O034 Incomplete spontaneous abortion without complication: Secondary | ICD-10-CM

## 2012-12-14 DIAGNOSIS — N925 Other specified irregular menstruation: Secondary | ICD-10-CM

## 2012-12-14 DIAGNOSIS — Z09 Encounter for follow-up examination after completed treatment for conditions other than malignant neoplasm: Secondary | ICD-10-CM

## 2012-12-14 DIAGNOSIS — R102 Pelvic and perineal pain: Secondary | ICD-10-CM

## 2012-12-14 LAB — CBC WITH DIFFERENTIAL/PLATELET
Basophils Absolute: 0 10*3/uL (ref 0.0–0.1)
Basophils Relative: 0 % (ref 0–1)
Eosinophils Absolute: 0.1 10*3/uL (ref 0.0–0.7)
Eosinophils Relative: 3 % (ref 0–5)
HCT: 33.3 % — ABNORMAL LOW (ref 36.0–46.0)
Hemoglobin: 10.8 g/dL — ABNORMAL LOW (ref 12.0–15.0)
Lymphocytes Relative: 39 % (ref 12–46)
Lymphs Abs: 1.4 10*3/uL (ref 0.7–4.0)
MCH: 28.6 pg (ref 26.0–34.0)
MCHC: 32.4 g/dL (ref 30.0–36.0)
MCV: 88.1 fL (ref 78.0–100.0)
Monocytes Absolute: 0.3 10*3/uL (ref 0.1–1.0)
Monocytes Relative: 8 % (ref 3–12)
Neutro Abs: 1.8 10*3/uL (ref 1.7–7.7)
Neutrophils Relative %: 50 % (ref 43–77)
Platelets: 294 10*3/uL (ref 150–400)
RBC: 3.78 MIL/uL — ABNORMAL LOW (ref 3.87–5.11)
RDW: 15.1 % (ref 11.5–15.5)
WBC: 3.7 10*3/uL — ABNORMAL LOW (ref 4.0–10.5)

## 2012-12-14 MED ORDER — LIDOCAINE HCL 1 % IJ SOLN
10.0000 mL | Freq: Once | INTRAMUSCULAR | Status: AC
  Administered 2012-12-14: 10 mL

## 2012-12-14 MED ORDER — DOXYCYCLINE HYCLATE 100 MG PO CAPS: 100.0000 mg | ORAL_CAPSULE | Freq: Two times a day (BID) | ORAL | Status: DC

## 2012-12-14 MED ORDER — KETOROLAC TROMETHAMINE 30 MG/ML IJ SOLN: 30.0000 mg | Freq: Once | INTRAMUSCULAR | Status: AC

## 2012-12-14 NOTE — Progress Notes (Addendum)
Patient presented to the office today for 2 weeks postop visit. Patient status post dilatation and evacuation for first trimester missed AB with decreasing quantitative beta hCGs. Patient states that since the [name] she has continued to bleed. Preoperatively her quantitative beta hCGs were as follows:  Results for Marissa [name], Marissa [name] (MRN [id number]) as of 11/27/2012 17:58   Ref. Range  10/19/2012 16:07  11/01/2012 10:19  11/09/2012 15:29  11/22/2012 09:38   hCG, Beta Chain, Quant, S  No range found  9216.6  52948.3  63411.3  30731.9    Her pathology report demonstrated the following:  Products of Conception PRODUCTS OF CONCEPTION Microscopic Comment There are immature chorionic villi present without evidence of hydatidiform change.  Exam: Pelvic: Bartholin urethra Skene was within normal limits Vagina: Blood was noted in the vaginal vault Cervix: No active bleeding Uterus: Anteverted 6-8 weeks' size nontender Adnexa: A viable mass or tenderness Rectal exam: Not done  An ultrasound was done in the office today to rule out the possibility of retained products of conception. It was noted prominent endometrial cavity with retained products of conception vascular flow was seen measuring 24 x 28 x 40 mm. Right ovary was normal. Left ovary thick wall cyst internal low level echo with a measurement reported 2219 mm avascular. There was no fluid in the cul-de-sac.  With these findings the patient was counseled for afebrile and uterine aspiration further retained products of conception. She received 30 mg of Toradol IM. Her cervix is then cleansed with Betadine solution. One percent lidocaine was infiltrated into the cervical stroma at the 248 and 10:00 position for a total 8 cc. Several passes with the  Vabra  Aspirator catheter was performed to remove the remaining tissue which was submitted for histological evaluation. Postprocedure ultrasound demonstrated that the cavity was evacuated.  Assessment/plan:  Patient 2 weeks status post D&E for first trimester missed AB with small amount of retained products of conception. Tissue was removed with  Vabra aspirator which patient tolerated procedure well. She has Motrin 800 mg which she can take at home 3 times a day when necessary. She will stop by the lab so we can check a CBC and a quantitative beta-hCG. For prophylaxis she'll be placed on Vibramycin 100 mg twice a day for 7 days. She'll return to the office in 2 weeks for followup. The patient is Rh+.

## 2012-12-15 ENCOUNTER — Ambulatory Visit: Payer: No Typology Code available for payment source | Admitting: Gynecology

## 2012-12-15 ENCOUNTER — Other Ambulatory Visit: Payer: No Typology Code available for payment source

## 2012-12-28 ENCOUNTER — Other Ambulatory Visit: Payer: Self-pay

## 2013-01-01 ENCOUNTER — Encounter: Payer: No Typology Code available for payment source | Admitting: Gynecology

## 2013-01-01 ENCOUNTER — Encounter: Payer: Self-pay | Admitting: Gynecology

## 2013-01-02 NOTE — Progress Notes (Signed)
Patient canceled appointment 

## 2013-01-17 ENCOUNTER — Ambulatory Visit: Payer: No Typology Code available for payment source | Admitting: Gynecology

## 2013-01-25 ENCOUNTER — Encounter: Payer: Self-pay | Admitting: Gynecology

## 2013-01-25 ENCOUNTER — Ambulatory Visit (INDEPENDENT_AMBULATORY_CARE_PROVIDER_SITE_OTHER): Payer: No Typology Code available for payment source | Admitting: Gynecology

## 2013-01-25 VITALS — BP 124/74

## 2013-01-25 DIAGNOSIS — Z8759 Personal history of other complications of pregnancy, childbirth and the puerperium: Secondary | ICD-10-CM

## 2013-01-25 DIAGNOSIS — Z309 Encounter for contraceptive management, unspecified: Secondary | ICD-10-CM

## 2013-01-25 DIAGNOSIS — D649 Anemia, unspecified: Secondary | ICD-10-CM

## 2013-01-25 LAB — CBC WITH DIFFERENTIAL/PLATELET
Basophils Absolute: 0 10*3/uL (ref 0.0–0.1)
Basophils Relative: 0 % (ref 0–1)
Eosinophils Absolute: 0.2 10*3/uL (ref 0.0–0.7)
Eosinophils Relative: 4 % (ref 0–5)
HCT: 35.1 % — ABNORMAL LOW (ref 36.0–46.0)
Hemoglobin: 11.5 g/dL — ABNORMAL LOW (ref 12.0–15.0)
Lymphocytes Relative: 34 % (ref 12–46)
Lymphs Abs: 1.5 10*3/uL (ref 0.7–4.0)
MCH: 27.7 pg (ref 26.0–34.0)
MCHC: 32.8 g/dL (ref 30.0–36.0)
MCV: 84.6 fL (ref 78.0–100.0)
Monocytes Absolute: 0.4 10*3/uL (ref 0.1–1.0)
Monocytes Relative: 10 % (ref 3–12)
Neutro Abs: 2.3 10*3/uL (ref 1.7–7.7)
Neutrophils Relative %: 52 % (ref 43–77)
Platelets: 258 10*3/uL (ref 150–400)
RBC: 4.15 MIL/uL (ref 3.87–5.11)
RDW: 13.7 % (ref 11.5–15.5)
WBC: 4.4 10*3/uL (ref 4.0–10.5)

## 2013-01-25 MED ORDER — ETONOGESTREL-ETHINYL ESTRADIOL 0.12-0.015 MG/24HR VA RING: VAGINAL_RING | VAGINAL | Status: DC

## 2013-01-25 NOTE — Progress Notes (Signed)
  Patient is a 42 year old gravida 1 para 0 AB 1 who in October of this year had a dilatation and evacuation for a missed AB. See previous notes. Patient conceived spontaneously was not using any form of contraception. Patient had a normal last menstrual period on November 15. She states her cycles typically last 7 days. Several months ago her hemoglobin was 10.8 and she begin taking iron supplementation. She is otherwise doing well today. Patient wanted to discuss different contraceptive options.  Exam: Abdomen: Soft nontender no rebound or guarding Pelvic: Bartholin urethra Skene was within normal limits Vagina: No lesions or discharge Cervix: No lesions or discharge Uterus: Anteverted normal size shape and consistency Adnexa:no palpable mass or tenderness Rectal exam: Not done  Assessment/plan: Patient with recent miscarriage with advanced maternal age. We discussed in detail potential future risk if she were to conceive at the age of 6 to include chromosomal abnormalities in the fetus along with maternal hypertension, diabetes, preterm labor, in premature delivery. Patient is interested in proceeding with contraceptive options which all were discussed and she had decided to proceed with the Nuvaring for which ligatures information was provided. Patient is a nonsmoker. Risks benefits and pros and cons to include risk for DVT and PE were discussed. She was reminded on the importance of monthly self breast examination. She was also given literature formation the Mirena IUD. She will return back in 6 months for her annual exam or when necessary.

## 2013-02-02 ENCOUNTER — Telehealth: Payer: Self-pay | Admitting: Gynecology

## 2013-02-02 NOTE — Telephone Encounter (Signed)
02/02/13-LM VM  For this patient that her ins does not cover the Mirena IUD. Her cost would be $1485.00 payable at appt. Also, told to call when on cycle to schedule insertion.WL

## 2013-02-23 ENCOUNTER — Emergency Department (HOSPITAL_COMMUNITY)
Admission: EM | Admit: 2013-02-23 | Discharge: 2013-02-23 | Disposition: A | Discharge status: Home / Self Care | Payer: No Typology Code available for payment source | Source: Home / Self Care

## 2013-02-23 ENCOUNTER — Emergency Department (INDEPENDENT_AMBULATORY_CARE_PROVIDER_SITE_OTHER): Payer: No Typology Code available for payment source

## 2013-02-23 ENCOUNTER — Encounter (HOSPITAL_COMMUNITY): Payer: Self-pay | Admitting: Emergency Medicine

## 2013-02-23 DIAGNOSIS — M25572 Pain in left ankle and joints of left foot: Secondary | ICD-10-CM

## 2013-02-23 DIAGNOSIS — M79672 Pain in left foot: Secondary | ICD-10-CM

## 2013-02-23 DIAGNOSIS — M25579 Pain in unspecified ankle and joints of unspecified foot: Secondary | ICD-10-CM

## 2013-02-23 DIAGNOSIS — R609 Edema, unspecified: Secondary | ICD-10-CM

## 2013-02-23 DIAGNOSIS — R600 Localized edema: Secondary | ICD-10-CM

## 2013-02-23 DIAGNOSIS — M79609 Pain in unspecified limb: Secondary | ICD-10-CM

## 2013-02-23 MED ORDER — TRIAMCINOLONE ACETONIDE 40 MG/ML IJ SUSP
INTRAMUSCULAR | Status: AC
  Filled 2013-02-23: qty 1

## 2013-02-23 MED ORDER — TRIAMCINOLONE ACETONIDE 40 MG/ML IJ SUSP
40.0000 mg | Freq: Once | INTRAMUSCULAR | Status: AC
  Administered 2013-02-23: 40 mg via INTRAMUSCULAR

## 2013-02-23 MED ORDER — HYDROCODONE-ACETAMINOPHEN 5-325 MG PO TABS
1.0000 | ORAL_TABLET | Freq: Four times a day (QID) | ORAL | Status: DC | PRN
Start: 2013-02-23 — End: 2013-03-13

## 2013-02-23 NOTE — Discharge Instructions (Signed)
Ankle Pain Elevate, wear wrap most of the time Ankle pain is a common symptom. The bones, cartilage, tendons, and muscles of the ankle joint perform a lot of work each day. The ankle joint holds your body weight and allows you to move around. Ankle pain can occur on either side or back of 1 or both ankles. Ankle pain may be sharp and burning or dull and aching. There may be tenderness, stiffness, redness, or warmth around the ankle. The pain occurs more often when a person walks or puts pressure on the ankle. CAUSES  There are many reasons ankle pain can develop. It is important to work with your caregiver to identify the cause since many conditions can impact the bones, cartilage, muscles, and tendons. Causes for ankle pain include:  Injury, including a break (fracture), sprain, or strain often due to a fall, sports, or a high-impact activity.  Swelling (inflammation) of a tendon (tendonitis).  Achilles tendon rupture.  Ankle instability after repeated sprains and strains.  Poor foot alignment.  Pressure on a nerve (tarsal tunnel syndrome).  Arthritis in the ankle or the lining of the ankle.  Crystal formation in the ankle (gout or pseudogout). DIAGNOSIS  A diagnosis is based on your medical history, your symptoms, results of your physical exam, and results of diagnostic tests. Diagnostic tests may include X-ray exams or a computerized magnetic scan (magnetic resonance imaging, MRI). TREATMENT  Treatment will depend on the cause of your ankle pain and may include:  Keeping pressure off the ankle and limiting activities.  Using crutches or other walking support (a cane or brace).  Using rest, ice, compression, and elevation.  Participating in physical therapy or home exercises.  Wearing shoe inserts or special shoes.  Losing weight.  Taking medications to reduce pain or swelling or receiving an injection.  Undergoing surgery. HOME CARE INSTRUCTIONS   Only take  over-the-counter or prescription medicines for pain, discomfort, or fever as directed by your caregiver.  Put ice on the injured area.  Put ice in a plastic bag.  Place a towel between your skin and the bag.  Leave the ice on for 15-20 minutes at a time, 03-04 times a day.  Keep your leg raised (elevated) when possible to lessen swelling.  Avoid activities that cause ankle pain.  Follow specific exercises as directed by your caregiver.  Record how often you have ankle pain, the location of the pain, and what it feels like. This information may be helpful to you and your caregiver.  Ask your caregiver about returning to work or sports and whether you should drive.  Follow up with your caregiver for further examination, therapy, or testing as directed. SEEK MEDICAL CARE IF:   Pain or swelling continues or worsens beyond 1 week.  You have an oral temperature above 102 F (38.9 C).  You are feeling unwell or have chills.  You are having an increasingly difficult time with walking.  You have loss of sensation or other new symptoms.  You have questions or concerns. MAKE SURE YOU:   Understand these instructions.  Will watch your condition.  Will get help right away if you are not doing well or get worse. Document Released: 07/29/2009 Document Revised: 05/03/2011 Document Reviewed: 07/29/2009 [facility] Patient Information 2014 [place].  Edema Edema is an abnormal build-up of fluids in tissues. Because this is partly dependent on gravity (water flows to the lowest place), it is more common in the legs and thighs (lower extremities). It  is also common in the looser tissues, like around the eyes. Painless swelling of the feet and ankles is common and increases as a person ages. It may affect both legs and may include the calves or even thighs. When squeezed, the fluid may move out of the affected area and may leave a dent for a few moments. CAUSES   Prolonged standing or  sitting in one place for extended periods of time. Movement helps pump tissue fluid into the veins, and absence of movement prevents this, resulting in edema.  Varicose veins. The valves in the veins do not work as well as they should. This causes fluid to leak into the tissues.  Fluid and salt overload.  Injury, burn, or surgery to the leg, ankle, or foot, may damage veins and allow fluid to leak out.  Sunburn damages vessels. Leaky vessels allow fluid to go out into the sunburned tissues.  Allergies (from insect bites or stings, medications or chemicals) cause swelling by allowing vessels to become leaky.  Protein in the blood helps keep fluid in your vessels. Low protein, as in malnutrition, allows fluid to leak out.  Hormonal changes, including pregnancy and menstruation, cause fluid retention. This fluid may leak out of vessels and cause edema.  Medications that cause fluid retention. Examples are sex hormones, blood pressure medications, steroid treatment, or anti-depressants.  Some illnesses cause edema, especially heart failure, kidney disease, or liver disease.  Surgery that cuts veins or lymph nodes, such as surgery done for the heart or for breast cancer, may result in edema. DIAGNOSIS  Your caregiver is usually easily able to determine what is causing your swelling (edema) by simply asking what is wrong (getting a history) and examining you (doing a physical). Sometimes x-rays, EKG (electrocardiogram or heart tracing), and blood work may be done to evaluate for underlying medical illness. TREATMENT  General treatment includes:  Leg elevation (or elevation of the affected body part).  Restriction of fluid intake.  Prevention of fluid overload.  Compression of the affected body part. Compression with elastic bandages or support stockings squeezes the tissues, preventing fluid from entering and forcing it back into the blood vessels.  Diuretics (also called water pills or  fluid pills) pull fluid out of your body in the form of increased urination. These are effective in reducing the swelling, but can have side effects and must be used only under your caregiver's supervision. Diuretics are appropriate only for some types of edema. The specific treatment can be directed at any underlying causes discovered. Heart, liver, or kidney disease should be treated appropriately. HOME CARE INSTRUCTIONS   Elevate the legs (or affected body part) above the level of the heart, while lying down.  Avoid sitting or standing still for prolonged periods of time.  Avoid putting anything directly under the knees when lying down, and do not wear constricting clothing or garters on the upper legs.  Exercising the legs causes the fluid to work back into the veins and lymphatic channels. This may help the swelling go down.  The pressure applied by elastic bandages or support stockings can help reduce ankle swelling.  A low-salt diet may help reduce fluid retention and decrease the ankle swelling.  Take any medications exactly as prescribed. SEEK MEDICAL CARE IF:  Your edema is not responding to recommended treatments. SEEK IMMEDIATE MEDICAL CARE IF:   You develop shortness of breath or chest pain.  You cannot breathe when you lay down; or if, while lying down, you  have to get up and go to the window to get your breath.  You are having increasing swelling without relief from treatment.  You develop a fever over 102 F (38.9 C).  You develop pain or redness in the areas that are swollen.  Tell your caregiver right away if you have gained 03 lb/1.4 kg in 1 day or 05 lb/2.3 kg in a week. MAKE SURE YOU:   Understand these instructions.  Will watch your condition.  Will get help right away if you are not doing well or get worse. Document Released: 02/08/2005 Document Revised: 08/10/2011 Document Reviewed: 09/27/2007 [facility] Patient Information 2014 [place].

## 2013-02-23 NOTE — ED Provider Notes (Signed)
Medical screening examination/treatment/procedure(s) were performed by resident physician or non-physician practitioner and as supervising physician I was immediately available for consultation/collaboration.   Ashland Osmer DOUGLAS MD.   Leroy Pettway D Cylinda Santoli, MD 02/23/13 1903 
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
## 2013-02-23 NOTE — ED Notes (Signed)
Left inner ankle pain and pain and swelling into left foot.  No known injury.  Pain regardless of positioning and /or weight bearing

## 2013-03-01 ENCOUNTER — Ambulatory Visit (INDEPENDENT_AMBULATORY_CARE_PROVIDER_SITE_OTHER): Payer: No Typology Code available for payment source

## 2013-03-01 ENCOUNTER — Encounter: Payer: Self-pay | Admitting: Podiatry

## 2013-03-01 ENCOUNTER — Ambulatory Visit (INDEPENDENT_AMBULATORY_CARE_PROVIDER_SITE_OTHER): Payer: No Typology Code available for payment source | Admitting: Podiatry

## 2013-03-01 VITALS — BP 117/70 | HR 82 | Resp 16 | Ht 66.0 in | Wt 204.0 lb

## 2013-03-01 DIAGNOSIS — M722 Plantar fascial fibromatosis: Secondary | ICD-10-CM

## 2013-03-01 DIAGNOSIS — M7750 Other enthesopathy of unspecified foot: Secondary | ICD-10-CM

## 2013-03-01 DIAGNOSIS — M79609 Pain in unspecified limb: Secondary | ICD-10-CM

## 2013-03-01 DIAGNOSIS — R52 Pain, unspecified: Secondary | ICD-10-CM

## 2013-03-01 DIAGNOSIS — M778 Other enthesopathies, not elsewhere classified: Secondary | ICD-10-CM

## 2013-03-01 DIAGNOSIS — M779 Enthesopathy, unspecified: Secondary | ICD-10-CM

## 2013-03-01 LAB — SEDIMENTATION RATE: Sed Rate: 30 mm/hr — ABNORMAL HIGH (ref 0–22)

## 2013-03-01 NOTE — Progress Notes (Signed)
   Subjective:    Patient ID: Marissa [name], female    DOB: 18-Apr-1970, 43 y.o.   MRN: [id number]  Foot Pain This is a new (Heel Pain, Dorsal Pain) problem. Episode onset: 1 week. The problem occurs 2 to 4 times per day. Progression since onset: gotten a little better. Associated symptoms comments: swelling. Exacerbated by: walking barefoot, when I get up in the morning. She has tried ice (wrapped it in an ace bandage, Urgent Care and they gave me a Cortisone Shot) for the symptoms. The treatment provided moderate relief.    "My foot has pain and swelling.  I can't put it down flat."    Review of Systems  Musculoskeletal: Positive for gait problem.  All other systems reviewed and are negative.       Objective:   Physical Exam: I have reviewed her past history medications allergies surgeries social history family history and review of systems. Pulses are palpable bilateral lower extremity. Neurologic sensorium is intact per since once the monofilament. Deep tendon reflexes are intact bilateral muscle strength is 5 over 5 dorsiflexors plantar flexors inverters everters all of his musculature is intact. Orthopedic evaluation demonstrates pain on palpation to the medial calcaneal tubercle of the left heel. Some tenderness on palpation of the posterior tibial tendon but appears to be intact. She also has some tenderness on palpation of the lateral aspect of the foot overlying the fifth metatarsal of the fourth fifth metatarsal cuboid articulation. Radiographic evaluation does demonstrate edema about the left foot no osseous fractures or cortical interruption is are noted. She does have soft tissue increase in density at the plantar fascial calcaneal insertion site of the left heel. This is possibly indicative of plantar fasciitis.         Assessment & Plan:  Assessment: Plantar fasciitis left foot.  Plan: Injected the left heel today with Kenalog and local anesthetic also started her on a  Sterapred Dosepak to be followed by Mobic. I also put her in a compression anklet as well as a Cam Walker. I will also have blood work performed looking for seropositive arthropathy. I will followup with her in 2-4 weeks

## 2013-03-02 LAB — RHEUMATOID FACTOR: Rhuematoid fact SerPl-aCnc: <10 IU/mL (ref <=14)

## 2013-03-02 LAB — ANA: Anti Nuclear Antibody(ANA): NEGATIVE

## 2013-03-02 LAB — URIC ACID: Uric Acid, Serum: 5.4 mg/dL (ref 2.4–7.0)

## 2013-03-05 ENCOUNTER — Telehealth: Payer: Self-pay | Admitting: *Deleted

## 2013-03-05 NOTE — Telephone Encounter (Signed)
Message copied by Lolita Rieger on Mon Mar 05, 2013  9:00 AM ------      Message from: [place], [place] T      Created: Fri Mar 02, 2013 11:54 AM       Mild elevation in sed rate. No action necessary at this time. I will follow up with her at the next appointment.  You may inform patient that the results of the blood work were essentially normal. ------

## 2013-03-05 NOTE — Telephone Encounter (Signed)
I called and left patient a message that lab results were essentially normal.  He will follow-up with her at next scheduled appointment.  Please call with any questions.

## 2013-03-13 ENCOUNTER — Other Ambulatory Visit: Payer: Self-pay

## 2013-03-13 ENCOUNTER — Emergency Department (HOSPITAL_COMMUNITY): Payer: No Typology Code available for payment source

## 2013-03-13 ENCOUNTER — Emergency Department (INDEPENDENT_AMBULATORY_CARE_PROVIDER_SITE_OTHER)
Admission: EM | Admit: 2013-03-13 | Discharge: 2013-03-13 | Disposition: A | Discharge status: Nursing Home (Includes ICF) | Payer: No Typology Code available for payment source | Source: Home / Self Care | Attending: Family Medicine | Admitting: Family Medicine

## 2013-03-13 ENCOUNTER — Encounter (HOSPITAL_COMMUNITY): Payer: Self-pay | Admitting: Emergency Medicine

## 2013-03-13 ENCOUNTER — Encounter (HOSPITAL_COMMUNITY): Payer: Self-pay | Admitting: Radiology

## 2013-03-13 ENCOUNTER — Inpatient Hospital Stay (HOSPITAL_COMMUNITY)
Admission: EM | Admit: 2013-03-13 | Discharge: 2013-03-15 | DRG: 176 | Disposition: A | Discharge status: Home / Self Care | Payer: No Typology Code available for payment source | Attending: Pulmonary Disease | Admitting: Pulmonary Disease

## 2013-03-13 DIAGNOSIS — D649 Anemia, unspecified: Secondary | ICD-10-CM | POA: Diagnosis present

## 2013-03-13 DIAGNOSIS — E669 Obesity, unspecified: Secondary | ICD-10-CM | POA: Diagnosis present

## 2013-03-13 DIAGNOSIS — Z6832 Body mass index (BMI) 32.0-32.9, adult: Secondary | ICD-10-CM

## 2013-03-13 DIAGNOSIS — I2699 Other pulmonary embolism without acute cor pulmonale: Principal | ICD-10-CM

## 2013-03-13 DIAGNOSIS — F411 Generalized anxiety disorder: Secondary | ICD-10-CM | POA: Diagnosis present

## 2013-03-13 DIAGNOSIS — I824Z9 Acute embolism and thrombosis of unspecified deep veins of unspecified distal lower extremity: Secondary | ICD-10-CM | POA: Diagnosis present

## 2013-03-13 DIAGNOSIS — Z800 Family history of malignant neoplasm of digestive organs: Secondary | ICD-10-CM

## 2013-03-13 DIAGNOSIS — Z23 Encounter for immunization: Secondary | ICD-10-CM

## 2013-03-13 DIAGNOSIS — Z801 Family history of malignant neoplasm of trachea, bronchus and lung: Secondary | ICD-10-CM

## 2013-03-13 DIAGNOSIS — I824Y9 Acute embolism and thrombosis of unspecified deep veins of unspecified proximal lower extremity: Secondary | ICD-10-CM | POA: Diagnosis present

## 2013-03-13 DIAGNOSIS — R0602 Shortness of breath: Secondary | ICD-10-CM

## 2013-03-13 DIAGNOSIS — R079 Chest pain, unspecified: Secondary | ICD-10-CM

## 2013-03-13 DIAGNOSIS — D696 Thrombocytopenia, unspecified: Secondary | ICD-10-CM | POA: Diagnosis present

## 2013-03-13 DIAGNOSIS — Z833 Family history of diabetes mellitus: Secondary | ICD-10-CM

## 2013-03-13 DIAGNOSIS — E873 Alkalosis: Secondary | ICD-10-CM | POA: Diagnosis present

## 2013-03-13 DIAGNOSIS — Z8249 Family history of ischemic heart disease and other diseases of the circulatory system: Secondary | ICD-10-CM

## 2013-03-13 HISTORY — DX: Other pulmonary embolism without acute cor pulmonale: I26.99

## 2013-03-13 LAB — COMPREHENSIVE METABOLIC PANEL
ALT: 14 U/L (ref 0–35)
AST: 23 U/L (ref 0–37)
Albumin: 3.6 g/dL (ref 3.5–5.2)
Alkaline Phosphatase: 98 U/L (ref 39–117)
BUN: 8 mg/dL (ref 6–23)
CO2: 18 mEq/L — ABNORMAL LOW (ref 19–32)
Calcium: 9.3 mg/dL (ref 8.4–10.5)
Chloride: 102 mEq/L (ref 96–112)
Creatinine, Ser: 0.96 mg/dL (ref 0.50–1.10)
GFR calc Af Amer: 83 mL/min — ABNORMAL LOW (ref >90)
GFR calc non Af Amer: 71 mL/min — ABNORMAL LOW (ref >90)
Glucose, Bld: 99 mg/dL (ref 70–99)
Potassium: 4.4 mEq/L (ref 3.7–5.3)
Sodium: 139 mEq/L (ref 137–147)
Total Bilirubin: 0.2 mg/dL — ABNORMAL LOW (ref 0.3–1.2)
Total Protein: 7.7 g/dL (ref 6.0–8.3)

## 2013-03-13 LAB — HCG, QUANTITATIVE, PREGNANCY: hCG, Beta Chain, Quant, S: <1 m[IU]/mL (ref <5)

## 2013-03-13 LAB — POCT I-STAT TROPONIN I
Troponin i, poc: 0.04 ng/mL (ref 0.00–0.08)
Troponin i, poc: 0.01 ng/mL (ref 0.00–0.08)

## 2013-03-13 LAB — CBC
HCT: 36.8 % (ref 36.0–46.0)
Hemoglobin: 12 g/dL (ref 12.0–15.0)
MCH: 28.8 pg (ref 26.0–34.0)
MCHC: 32.6 g/dL (ref 30.0–36.0)
MCV: 88.5 fL (ref 78.0–100.0)
Platelets: 167 10*3/uL (ref 150–400)
RBC: 4.16 MIL/uL (ref 3.87–5.11)
RDW: 13.3 % (ref 11.5–15.5)
WBC: 6.1 10*3/uL (ref 4.0–10.5)

## 2013-03-13 LAB — TROPONIN I: Troponin I: <0.3 ng/mL (ref <0.30)

## 2013-03-13 LAB — ANTITHROMBIN III: AntiThromb III Func: 92 % (ref 75–120)

## 2013-03-13 MED ORDER — ASPIRIN 300 MG RE SUPP: 300.0000 mg | RECTAL | Status: AC

## 2013-03-13 MED ORDER — HEPARIN BOLUS VIA INFUSION
4500.0000 [IU] | Freq: Once | INTRAVENOUS | Status: AC
  Administered 2013-03-13: 4500 [IU] via INTRAVENOUS
  Filled 2013-03-13: qty 4500

## 2013-03-13 MED ORDER — SODIUM CHLORIDE 0.9 % IV SOLN
INTRAVENOUS | Status: DC
  Administered 2013-03-14: via INTRAVENOUS

## 2013-03-13 MED ORDER — MORPHINE SULFATE 2 MG/ML IJ SOLN: 2.0000 mg | Freq: Once | INTRAMUSCULAR | Status: DC

## 2013-03-13 MED ORDER — HEPARIN (PORCINE) IN NACL 100-0.45 UNIT/ML-% IJ SOLN
1200.0000 [IU]/h | INTRAMUSCULAR | Status: DC
  Administered 2013-03-13: 1300 [IU]/h via INTRAVENOUS
  Filled 2013-03-13 (×2): qty 250

## 2013-03-13 MED ORDER — IOHEXOL 350 MG/ML SOLN
80.0000 mL | Freq: Once | INTRAVENOUS | Status: AC | PRN
  Administered 2013-03-13: 80 mL via INTRAVENOUS

## 2013-03-13 MED ORDER — ASPIRIN 81 MG PO CHEW
324.0000 mg | CHEWABLE_TABLET | ORAL | Status: AC
  Administered 2013-03-14: 324 mg via ORAL
  Filled 2013-03-13: qty 4
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
## 2013-03-13 NOTE — Progress Notes (Signed)
Patient ID: Marissa [name], female   DOB: December 24, 1970, 43 y.o.   MRN: [id number]  I was asked to see this [name] in consultation to eval for candidacy for potential catheter directed pulmonary arterial thrombolysis at Marissa request of Marissa ED and Dr. Earnest Conroy (critical care).  In brief, this is a previously well 43 y/o F with insidious onset of left lower leg pain and swelling earlier this month.  No know injury to Marissa left lower extremity.  For Marissa past week, Marissa [name] has been becoming progressively SOB, worse with activity, especially climbing stairs and walking long distances.  This prompted her presentation to Marissa [facility] ED this evening where chest CTA demonstrated a large bilateral clot burden with CT findings indicative of right sided heart strain, imaging findings compatible with submassive PE.  Marissa [name] has had no recent major operations.  No recent injury.  No history of cancer or prior DVT.  Of note, [name] had a miscarriage in 10/14, but denies any abn bleeding.  Marissa [name] is currently not menstruating.   Marissa [name] is currently hemodynamically stable, slightly hypertensive (146/86) and slightly tachycardiac (HR - 80-90s).  O2 sat is 100% on RA.  Marissa [name] is currently being admitted to Marissa ICU to begin a heparin drip.  Marissa procedure including Marissa benefits (quicker resolution of clot burden and subsequent quicker reduction of right sided heart strain) and risks (including but not limited to non-target bleeding including ICH, access site complication, contrast and radiation exposure) of pulmonary arterial catheter directed thrombolysis were discussed at length with Marissa [name] and Marissa [name]'s sister and mother.  I believe in part as this is a new procedure we are offering at this hospital, Marissa patient and Marissa [name]'s family are hesitant to proceed with catheter directed thrombolysis and are opting for Marissa initiation of systemic therapy.  None-Marissa-less, I have requested Marissa ICU team obtain bilateral LE venous doppler US, ECG (to evaluate  for right BBB), cardiac echo (to eval Marissa degree of right sided heart strain suggested on CT) and place Marissa [name] on a clear liquid diet after midnight tonight.  A member of Marissa [facility] IR team will see Marissa [name] again in Marissa morning to ensure continued improvement in her clinic status and to again discuss this potential intervention.  Thank you for Marissa involvement in this [name]'s care.
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
## 2013-03-13 NOTE — ED Provider Notes (Signed)
I saw and evaluated the patient, reviewed the resident's note and I agree with the findings and plan.  EKG Interpretation    Date/Time:  Tuesday March 13 2013 11:35:30 EST Ventricular Rate:  103 PR Interval:  176 QRS Duration: 82 QT Interval:  342 QTC Calculation: 448 R Axis:   71 Text Interpretation:  Sinus tachycardia Otherwise normal ECG Confirmed by Jahari Billy  MD, Lauranne Beyersdorf ([id number]) on 03/13/2013 5:10:02 PM           I supervised and was present during key portions of the procedure.   Julianne Rice, MD 03/13/13 [phone]

## 2013-03-13 NOTE — ED Provider Notes (Signed)
Angiocath insertion Performed by: Louretta Shorten  Consent: Verbal consent obtained. Risks and benefits: risks, benefits and alternatives were discussed Time out: Immediately prior to procedure a "time out" was called to verify the correct patient, procedure, equipment, support staff and site/side marked as required.  Preparation: Patient was prepped and draped in the usual sterile fashion.  Vein Location: Left AC  20 gauge Angiocath placed into left brachial vein under real time US guidance. Dark red blood drawn back. Flushed without difficulty. No extravasation on [place] seen.   Gauge: 20  Normal blood return and flush without difficulty Patient tolerance: Patient tolerated the procedure well with no immediate complications.     Louretta Shorten, MD 03/13/13 [phone]

## 2013-03-13 NOTE — ED Notes (Signed)
Intermittent episodes of sob and heart racing particularly with activity, climbing of steps.  This started last week.  Patient reports she has been seeing a podiatrist for left foot pain and swelling, patient is wearing a orthopedic boot.  Patient reports she has had a cortisone injection, xrays on this foot, etc, no diagnosis.  Patient is also new to the nuvaring.  nuvaring for 2 months

## 2013-03-13 NOTE — ED Notes (Signed)
Sob on exertion and cp this am and  States that  Her left foot is swollen for no reason has seen a dr for that

## 2013-03-13 NOTE — Progress Notes (Signed)
ANTICOAGULATION CONSULT NOTE - Initial Consult  Pharmacy Consult for heparin Indication: pulmonary embolus  Allergies  Allergen Reactions  . Codeine Nausea Only    Patient Measurements: Height: 5\' 6"  (167.6 cm) Weight: 203 lb (92.08 kg) IBW/kg (Calculated) : 59.3 Heparin Dosing Weight: 79.5kg  Vital Signs: Temp: 99.7 F (37.6 C) (01/20 1643) Temp src: Oral (01/20 1643) BP: 134/61 mmHg (01/20 2210) Pulse Rate: 83 (01/20 2210)  Labs:  Recent Labs  03/13/13 1330  HGB 12.0  HCT 36.8  PLT 167  CREATININE 0.96    Estimated Creatinine Clearance: 86.4 ml/min (by C-G formula based on Cr of 0.96).   Medical History: History reviewed. No pertinent past medical history.  Medications:  Infusions:  . heparin    . heparin      Assessment: 89 yof presented to the ED with SOB and CP. CT showed a submassive PE w/ R heart strain. Her baseline CBC is WNL. She is not on any anticoagulation PTA. To start IV heparin after auto-immune labs drawn.   Goal of Therapy:  Heparin level 0.3-0.7 units/ml Monitor platelets by anticoagulation protocol: Yes   Plan:  1. Heparin bolus 4500 units x 1 2. Heparin gtt 1300 units/hr 3. Check a 6 hour heparin level 4. Daily heparin level and CBC 5. F/u start of oral anticoagulation  Marissa [name], Rande Lawman 03/13/2013,10:16 PM

## 2013-03-13 NOTE — ED Provider Notes (Signed)
Medical screening examination/treatment/procedure(s) were performed by resident physician or non-physician practitioner and as supervising physician I was immediately available for consultation/collaboration.   Pauline Good MD.   Billy Fischer, MD 03/13/13 [phone]

## 2013-03-14 ENCOUNTER — Encounter (HOSPITAL_COMMUNITY): Payer: Self-pay | Admitting: Pulmonary Disease

## 2013-03-14 DIAGNOSIS — R609 Edema, unspecified: Secondary | ICD-10-CM

## 2013-03-14 DIAGNOSIS — I369 Nonrheumatic tricuspid valve disorder, unspecified: Secondary | ICD-10-CM

## 2013-03-14 LAB — HCG, SERUM, QUALITATIVE: Preg, Serum: NEGATIVE

## 2013-03-14 LAB — BASIC METABOLIC PANEL
BUN: 8 mg/dL (ref 6–23)
CO2: 22 mEq/L (ref 19–32)
Calcium: 8.8 mg/dL (ref 8.4–10.5)
Chloride: 105 mEq/L (ref 96–112)
Creatinine, Ser: 0.96 mg/dL (ref 0.50–1.10)
GFR calc Af Amer: 83 mL/min — ABNORMAL LOW (ref >90)
GFR calc non Af Amer: 71 mL/min — ABNORMAL LOW (ref >90)
Glucose, Bld: 135 mg/dL — ABNORMAL HIGH (ref 70–99)
Potassium: 4 mEq/L (ref 3.7–5.3)
Sodium: 140 mEq/L (ref 137–147)

## 2013-03-14 LAB — CBC
HCT: 31.4 % — ABNORMAL LOW (ref 36.0–46.0)
Hemoglobin: 10 g/dL — ABNORMAL LOW (ref 12.0–15.0)
MCH: 28.2 pg (ref 26.0–34.0)
MCHC: 31.8 g/dL (ref 30.0–36.0)
MCV: 88.7 fL (ref 78.0–100.0)
Platelets: 146 10*3/uL — ABNORMAL LOW (ref 150–400)
RBC: 3.54 MIL/uL — ABNORMAL LOW (ref 3.87–5.11)
RDW: 13.3 % (ref 11.5–15.5)
WBC: 6.8 10*3/uL (ref 4.0–10.5)

## 2013-03-14 LAB — TROPONIN I
Troponin I: <0.3 ng/mL (ref <0.30)
Troponin I: <0.3 ng/mL (ref <0.30)
Troponin I: <0.3 ng/mL (ref <0.30)
Troponin I: <0.3 ng/mL (ref <0.30)

## 2013-03-14 LAB — C-REACTIVE PROTEIN: CRP: 2.7 mg/dL — ABNORMAL HIGH (ref <0.60)

## 2013-03-14 LAB — MRSA PCR SCREENING: MRSA by PCR: NEGATIVE

## 2013-03-14 LAB — HEPARIN LEVEL (UNFRACTIONATED): Heparin Unfractionated: 1.02 IU/mL — ABNORMAL HIGH (ref 0.30–0.70)

## 2013-03-14 MED ORDER — SODIUM CHLORIDE 0.9 % IV SOLN: INTRAVENOUS | Status: DC | PRN

## 2013-03-14 MED ORDER — RIVAROXABAN (XARELTO) EDUCATION KIT FOR DVT/PE PATIENTS
PACK | Freq: Once | Status: AC
  Administered 2013-03-14: 14:00:00
  Filled 2013-03-14: qty 1

## 2013-03-14 MED ORDER — RIVAROXABAN 15 MG PO TABS
15.0000 mg | ORAL_TABLET | Freq: Two times a day (BID) | ORAL | Status: DC
  Administered 2013-03-14 – 2013-03-15 (×3): 15 mg via ORAL
  Filled 2013-03-14 (×7): qty 1

## 2013-03-14 MED ORDER — RIVAROXABAN 20 MG PO TABS: 20.0000 mg | ORAL_TABLET | Freq: Every day | ORAL | Status: DC

## 2013-03-14 NOTE — Progress Notes (Signed)
Utilization Review Completed.Marissa [name] T12/02/2013  

## 2013-03-14 NOTE — Progress Notes (Signed)
PULMONARY / CRITICAL CARE MEDICINE  Name: Marissa [name] MRN: [id number] DOB: Jul 01, 1970    ADMISSION DATE:  03/13/2013  REFERRING MD :  EDP PRIMARY SERVICE: PCCM  CHIEF COMPLAINT:  Chest pain, dyspnea  BRIEF PATIENT DESCRIPTION: 43 y/o F admitted with bilateral PE.    SIGNIFICANT EVENTS: 1/20   Admitted with likely provoked PE (on birth control, sedentary & 2 wks in walking boot).  Declined catheter directed thrombolytic therapy 1/21   Transfer to telemetry, start xarelto  STUDIES: 1/20 - CTA Chest >>> Positive for acute PE with CT evidence of right heartstrain (RV/LV Ratio = 1.18) consistent with at least submassive (intermediate risk)PE.   LINES / TUBES: PIV  Hypercoagulable panel: 1/20  Homocystine  >>> 1/20  CRP  >>> 1/20  Prothrombin Gene Mutation  >>> 1/20  Lupus Anticoagulant  >>> 1/20  Factor V Leiden  >>>  SUBJECTIVE:  Denies chest pain, dyspnea, abdominal pain.  VITAL SIGNS: Temp:  [98 F (36.7 C)-99.7 F (37.6 C)] 98.3 F (36.8 C) (01/21 0727) Pulse Rate:  [74-101] 91 (01/21 0800) Resp:  [10-24] 22 (01/21 0800) BP: (97-146)/(48-94) 129/79 mmHg (01/21 0800) SpO2:  [96 %-100 %] 100 % (01/21 0800) Weight:  [194 lb 7.1 oz (88.2 kg)-203 lb (92.08 kg)] 194 lb 7.1 oz (88.2 kg) (01/21 0600) Room air  INTAKE / OUTPUT: Intake/Output     01/20 0701 - 01/21 0700 01/21 0701 - 01/22 0700   I.V. (mL/kg) 378 (4.3) 100 (1.1)   Total Intake(mL/kg) 378 (4.3) 100 (1.1)   Urine (mL/kg/hr) 525    Total Output 525     Net -147 +100         PHYSICAL EXAMINATION: General: no distress Neuro: Alert, pleasant, normal strength HEENT: no sinus tenderness Cardiovascular: regular, no murmur Lungs: no wheeze/rales Abdomen: soft, non tender Musculoskeletal: Lt > Rt lower extremity edema Skin: no rashes  LABS:  CBC  Recent Labs Lab 03/13/13 1330 03/14/13 0338  WBC 6.1 6.8  HGB 12.0 10.0*  HCT 36.8 31.4*  PLT 167 146*   Coag's No results found for this  basename: APTT, INR,  in the last 168 hours  BMET  Recent Labs Lab 03/13/13 1330 03/14/13 0338  NA 139 140  K 4.4 4.0  CL 102 105  CO2 18* 22  BUN 8 8  CREATININE 0.96 0.96  GLUCOSE 99 135*   Electrolytes  Recent Labs Lab 03/13/13 1330 03/14/13 0338  CALCIUM 9.3 8.8   Liver Enzymes  Recent Labs Lab 03/13/13 1330  AST 23  ALT 14  ALKPHOS 98  BILITOT 0.2*  ALBUMIN 3.6   Cardiac Enzymes  Recent Labs Lab 03/13/13 2239 03/14/13 0338  TROPONINI <0.30 <0.30   Imaging Dg Chest 2 View  03/13/2013   CLINICAL DATA:  Chest pain and shortness of Breath.  EXAM: CHEST  2 VIEW  COMPARISON:  None.  FINDINGS: The heart size and mediastinal contours are within normal limits. Both lungs are clear. The visualized skeletal structures are unremarkable.  IMPRESSION: No active cardiopulmonary disease.   Electronically Signed   By: Lajean Manes M.D.   On: 03/13/2013 13:25   Ct Angio Chest Pe W/cm &/or Wo Cm  03/13/2013   CLINICAL DATA:  Shortness of breath, tachycardia, chest pain  EXAM: CT ANGIOGRAPHY CHEST WITH CONTRAST  TECHNIQUE: Multidetector CT imaging of the chest was performed using the standard protocol during bolus administration of intravenous contrast. Multiplanar CT image reconstructions including MIPs were obtained to evaluate the vascular  anatomy.  CONTRAST:  15mL OMNIPAQUE IOHEXOL 350 MG/ML SOLN  COMPARISON:  03/13/2013  FINDINGS: Central hilar and segmental pulmonary arterial filling defects are noted extending into several branches throughout both lungs compatible with acute bilateral pulmonary emboli. Thrombus burden is most pronounced in the right pulmonary artery at the hilar level, image 36. RV/LV ratio is estimated at 1.18, indicative of right heart strain. Therefore these findings are consistent with sub massive pulmonary emboli.  No adenopathy. Normal heart size. No pericardial pleural effusion. No hiatal hernia. Postop changes of the GE junction. Included upper  abdomen demonstrates no acute process.  Lung windows demonstrate overall slightly low volume with minor scattered ground-glass attenuation. No focal airspace process, collapse, consolidation, or pulmonary infarct. Trachea and central airways appear patent. No suspicious pulmonary nodule or mass.  No acute osseous finding.  Review of the MIP images confirms the above findings.  IMPRESSION: Positive for acute PE with CT evidence of right heartstrain (RV/LV Ratio = 1.18) consistent with at least submassive (intermediate risk)PE. The presence of right heart strain has been associated with an increased risk of morbidity and mortality. Consultation with Pulmonary and Critical Care Medicine is recommended.   Electronically Signed   By: Daryll Brod M.D.   On: 03/13/2013 20:46   ASSESSMENT / PLAN:  A: Acute bilateral PE >> likely provoked in setting of birth control, and recent relative immobility due to plantar fasciitis. P:   -start xarelto 1/21 -transition off heparin gtt per pharmacy once pt on xarelto -f/u hypercoagulable lab work, Echo, doppler legs  A: Mild anemia, thrombocytopenia >> no evidence for bleeding. P: -f/u CBC  Summary: Again discussed option of catheter directed thrombolytic therapy.  Reviewed pro's/con's of procedure.  She would not want to have this type of therapy.    Also discussed pro's/con's of different oral anticoagulants.  She would like to proceed with starting xarelto.  Will advance diet, and transfer to telemetry.  Keep on PCCM service since she may be ready for d/c home in next 24 to 48 hrs.   Chesley Mires, MD [facility] Pulmonary/Critical Care 03/14/2013, 8:54 AM Pager:  [phone] After 3pm call: [phone]

## 2013-03-14 NOTE — Significant Event (Signed)
1400-patient has been transferred to 2W, room 37, traveled via wheelchair. VS stable prior and during the transfer. All personal belongings taken with patient. Patient's aunt at bedside. Lunch tray taken to her new room. Patient settled in chair, call-bell within reach. Report given to receiving RN. Staff in room prior to RN leaving. Laddie Math, Therapist, sports.

## 2013-03-14 NOTE — Discharge Instructions (Signed)
Information on my medicine - XARELTO® (rivaroxaban) ° °This medication education was reviewed with me or my healthcare representative as part of my discharge preparation.   ° °WHY WAS XARELTO® PRESCRIBED FOR YOU? °Xarelto® was prescribed to treat blood clots that may have been found in the veins of your legs (deep vein thrombosis) or in your lungs (pulmonary embolism) and to reduce the risk of them occurring again. ° °What do you need to know about Xarelto®? °The starting dose is one 15 mg tablet taken TWICE daily with food for the FIRST 21 DAYS the dose is changed to one 20 mg tablet taken ONCE A DAY with your evening meal. ° °DO NOT stop taking Xarelto® without talking to the health care provider who prescribed the medication.  Refill your prescription for 20 mg tablets before you run out. ° °After discharge, you should have regular check-up appointments with your healthcare provider that is prescribing your Xarelto®.  In the future your dose may need to be changed if your kidney function changes by a significant amount. ° °What do you do if you miss a dose? °If you are taking Xarelto® TWICE DAILY and you miss a dose, take it as soon as you remember. You may take two 15 mg tablets (total 30 mg) at the same time then resume your regularly scheduled 15 mg twice daily the next day. ° °If you are taking Xarelto® ONCE DAILY and you miss a dose, take it as soon as you remember on the same day then continue your regularly scheduled once daily regimen the next day. Do not take two doses of Xarelto® at the same time.  ° °Important Safety Information °Xarelto® is a blood thinner medicine that can cause bleeding. You should call your healthcare provider right away if you experience any of the following: °? Bleeding from an injury or your nose that does not stop. °? Unusual colored urine (red or dark brown) or unusual colored stools (red or black). °? Unusual bruising for unknown reasons. °? A serious fall or if you hit your  head (even if there is no bleeding). ° °Some medicines may interact with Xarelto® and might increase your risk of bleeding while on Xarelto®. To help avoid this, consult your healthcare provider or pharmacist prior to using any new prescription or non-prescription medications, including herbals, vitamins, non-steroidal anti-inflammatory drugs (NSAIDs) and supplements. ° °This website has more information on Xarelto®: www.xarelto.com. ° °

## 2013-03-14 NOTE — Progress Notes (Signed)
Patient ID: Marissa [name], female   DOB: Sep 02, 1970, 43 y.o.   MRN: [id number] 43 yo female with bilateral pulmonary emboli and candidate for thrombolytic therapy.  Catheter directed thrombolytic therapy was discussed with the patient by Dr. Pascal Lux yesterday and Dr. Halford Chessman today.  Patient appears to have a good understanding of the procedure along with the pros/cons.  Patient does not want to have thrombolytic therapy.  Currently, the patient feels good and clinically stable.  IR is available if something changes.

## 2013-03-14 NOTE — Progress Notes (Signed)
Echo Lab  2D Echocardiogram completed.  Shalamar Plourde L Ashwika Freels, RDCS 03/14/2013 10:50 AM

## 2013-03-14 NOTE — Progress Notes (Signed)
ANTICOAGULATION CONSULT NOTE - Follow Up Consult  Pharmacy Consult for heparin Indication: pulmonary embolus  Labs:  Recent Labs  03/13/13 1330 03/13/13 2239 03/14/13 0338  HGB 12.0  --  10.0*  HCT 36.8  --  31.4*  PLT 167  --  146*  HEPARINUNFRC  --   --  1.02*  CREATININE 0.96  --  0.96  TROPONINI  --  <0.30 <0.30    Assessment: 42yo female supratherapeutic on heparin with initial dosing for PE, though lab was drawn only 4hr after bolus.  Goal of Therapy:  Heparin level 0.3-0.7 units/ml  Plan:  Will decrease heparin gtt slightly to 1200 units/hr assuming bolus is still affecting level and check level in 6hr.  Marissa [name], PharmD, BCPS  03/14/2013,6:15 AM

## 2013-03-14 NOTE — Progress Notes (Signed)
Bilateral lower extremity venous duplex completed.  Right:  No evidence of DVT, superficial thrombosis, or Baker's cyst.  Left: DVT noted in the popliteal and posterior tibial veins.  No evidence of superficial thrombosis.  No Baker's cyst.

## 2013-03-14 NOTE — Progress Notes (Signed)
Pharmacy Consult --> Heparin to Xarelto  43 year old with PE Started on heparin drip 1/20 Now transitioning to Xarelto  Plan: 1) Xarelto 15 mg po bid x 3 weeks then 20 mg daily 2) Continue to follow  Thank you. Anette Guarneri, PharmD [phone]

## 2013-03-14 NOTE — Significant Event (Signed)
Dr. Halford Chessman made aware of results from the bilateral lower extremity duplex. Per MD, continue with present orders. Marissa [name], Therapist, sports.

## 2013-03-15 ENCOUNTER — Encounter (HOSPITAL_COMMUNITY): Payer: Self-pay | Admitting: *Deleted

## 2013-03-15 LAB — BASIC METABOLIC PANEL
BUN: 11 mg/dL (ref 6–23)
CO2: 22 mEq/L (ref 19–32)
Calcium: 9 mg/dL (ref 8.4–10.5)
Chloride: 106 mEq/L (ref 96–112)
Creatinine, Ser: 1 mg/dL (ref 0.50–1.10)
GFR calc Af Amer: 79 mL/min — ABNORMAL LOW (ref >90)
GFR calc non Af Amer: 68 mL/min — ABNORMAL LOW (ref >90)
Glucose, Bld: 105 mg/dL — ABNORMAL HIGH (ref 70–99)
Potassium: 4.3 mEq/L (ref 3.7–5.3)
Sodium: 142 mEq/L (ref 137–147)

## 2013-03-15 LAB — CBC
HCT: 33.1 % — ABNORMAL LOW (ref 36.0–46.0)
Hemoglobin: 10.6 g/dL — ABNORMAL LOW (ref 12.0–15.0)
MCH: 28.5 pg (ref 26.0–34.0)
MCHC: 32 g/dL (ref 30.0–36.0)
MCV: 89 fL (ref 78.0–100.0)
Platelets: 168 10*3/uL (ref 150–400)
RBC: 3.72 MIL/uL — ABNORMAL LOW (ref 3.87–5.11)
RDW: 13.4 % (ref 11.5–15.5)
WBC: 4.9 10*3/uL (ref 4.0–10.5)

## 2013-03-15 LAB — TROPONIN I
Troponin I: <0.3 ng/mL (ref <0.30)
Troponin I: <0.3 ng/mL (ref <0.30)

## 2013-03-15 LAB — HOMOCYSTEINE: Homocysteine: 15.2 umol/L (ref 4.0–15.4)

## 2013-03-15 LAB — FACTOR 5 LEIDEN

## 2013-03-15 MED ORDER — RIVAROXABAN 15 MG PO TABS: 15.0000 mg | ORAL_TABLET | Freq: Two times a day (BID) | ORAL | Status: DC

## 2013-03-15 NOTE — Progress Notes (Signed)
03/15/2013 12:46 PM Nursing note Discharge avs form, medications already taken today and those due this evening given and explained to patient and family. Follow up appointments and when to call MD reviewed. Location of called in rx reviewed. IV site d/c by NT. D/c tele. D/c home per orders.  Nayra Coury, Arville Lime
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
## 2013-03-15 NOTE — Care Management Note (Signed)
    Page 1 of 1   03/15/2013     4:18:24 PM   CARE MANAGEMENT NOTE 03/15/2013  Patient:  Marissa [name], Marissa [name]   Account Number:  [id number]  Date Initiated:  03/15/2013  Documentation initiated by:  Kylian Loh  Subjective/Objective Assessment:   PT ADM ON 03/13/13 WITH BILAT PE, DVT.  PTA, PT INDEPENDENT OF ADLS.     Action/Plan:   CM REFERRAL FOR XARELTO; PT GIVEN XARELTO CARD AFTER ACTIVATION.  per per [facility] data rx: insurance will pay $100 per month anything more will be the patients responsibility   Anticipated DC Date:  03/15/2013   Anticipated DC Plan:  [place]  CM consult      Choice offered to / List presented to:             Status of service:  Completed, signed off Medicare Important Message given?   (If response is "NO", the following Medicare IM given date fields will be blank) Date Medicare IM given:   Date Additional Medicare IM given:    Discharge Disposition:  HOME/SELF CARE  Per UR Regulation:  Reviewed for med. necessity/level of care/duration of stay  If discussed at [place] of Stay Meetings, dates discussed:    Comments:

## 2013-03-16 LAB — PROTHROMBIN GENE MUTATION

## 2013-03-20 ENCOUNTER — Ambulatory Visit: Payer: No Typology Code available for payment source | Admitting: Podiatry

## 2013-03-20 LAB — ANTIPHOSPHOLIPID SYNDROME EVAL, BLD
Anticardiolipin IgA: 7 APL U/mL — ABNORMAL LOW (ref <22)
Anticardiolipin IgG: 4 GPL U/mL — ABNORMAL LOW (ref <23)
Anticardiolipin IgM: 3 MPL U/mL — ABNORMAL LOW (ref <11)
DRVVT: 40.5 secs (ref <42.9)
Lupus Anticoagulant: NOT DETECTED
PTT Lupus Anticoagulant: 37.7 secs (ref 28.0–43.0)
Phosphatydalserine, IgA: 4 U/mL (ref <20)
Phosphatydalserine, IgG: 10 U/mL (ref <16)
Phosphatydalserine, IgM: 9 U/mL (ref <22)

## 2013-03-29 ENCOUNTER — Ambulatory Visit (INDEPENDENT_AMBULATORY_CARE_PROVIDER_SITE_OTHER): Payer: No Typology Code available for payment source | Admitting: Adult Health

## 2013-03-29 ENCOUNTER — Encounter: Payer: Self-pay | Admitting: Adult Health

## 2013-03-29 ENCOUNTER — Ambulatory Visit (INDEPENDENT_AMBULATORY_CARE_PROVIDER_SITE_OTHER)
Admission: RE | Admit: 2013-03-29 | Discharge: 2013-03-29 | Disposition: A | Discharge status: Home / Self Care | Payer: No Typology Code available for payment source | Source: Ambulatory Visit | Attending: Adult Health | Admitting: Adult Health

## 2013-03-29 VITALS — BP 124/78 | HR 89 | Temp 98.5°F | Ht 66.0 in | Wt 200.0 lb

## 2013-03-29 DIAGNOSIS — I2699 Other pulmonary embolism without acute cor pulmonale: Secondary | ICD-10-CM

## 2013-03-29 MED ORDER — RIVAROXABAN 20 MG PO TABS: 20.0000 mg | ORAL_TABLET | Freq: Every day | ORAL | Status: DC

## 2013-03-29 NOTE — Assessment & Plan Note (Signed)
PE and Left DVT on birth control - RV ok on echo  Doing well on Xarelto  Advised to monitor menses and report if does not regulate or worsens.  Plan for xarelto ~6 mon   Plan  Continue on Xarelto 15mg  through 04/04/13.  Begin Xarelto 20mg  daily on 04/05/13 .  Call if develop any bleeding.  Call office if menses worsens or does not regulate.  Will need to set up follow up with OB/GYN as discussed  Follow up Dr. Halford Chessman  In 4-6 weeks and As needed

## 2013-03-29 NOTE — Progress Notes (Signed)
Subjective:    Patient ID: Marissa [name], female    DOB: 02-06-1971, 43 y.o.   MRN: [id number]  HPI 43 year old never smoker, admitted by [facility] 03/13/2013 for bilateral pulmonary embolism and left DVT.  03/29/2013 post hospital followup Patient returns for a post hospital followup. Patient was admitted January 20 through January 22 for bilateral pulmonary embolism,  And left DVT. Patient presented to the emergency room with chest pain. CT chest showed an acute pulmonary embolism. She was started on IV heparin, and transitioned over to Xarelto  Venous Doppler showed a left popliteal and posterior, tibial, DVT. 2-D echo showed no right ventricle abnormalities. Patient was discharged on Xarelto 15 mg twice daily for 3 weeks, then to transition, Xarelto 20 mg daily. Patient was on birth control, and recommended to discontinue this and use alternative birth control.   Since discharge. Patient is feeling better. Less dyspnea . Chest pain is resolved.  Patient denies any bleeding. Other than heavy menses.  Says she will have period for few days then stop and then restart again. Was on Nuvaring was d/c on admission.  We discussed that she can not use hormonal BC d/t clot risk. Recommended she see her OB in near future to discuss options. She has miscarriage in 12/2012 , also educated on dangers of pregnancy on Xarelto .  She is aware and does not plan on pregnancy.     Review of Systems Constitutional:   No  weight loss, night sweats,  Fevers, chills, fatigue, or  lassitude.  HEENT:   No headaches,  Difficulty swallowing,  Tooth/dental problems, or  Sore throat,                No sneezing, itching, ear ache, nasal congestion, post nasal drip,   CV:  No chest pain,  Orthopnea, PND, swelling in lower extremities, anasarca, dizziness, palpitations, syncope.   GI  No heartburn, indigestion, abdominal pain, nausea, vomiting, diarrhea, change in bowel habits, loss of appetite, bloody stools.    Resp: No shortness of breath with exertion or at rest.  No excess mucus, no productive cough,  No non-productive cough,  No coughing up of blood.  No change in color of mucus.  No wheezing.  No chest wall deformity  Skin: no rash or lesions.  GU: no dysuria, change in color of urine, no urgency or frequency.  No flank pain, no hematuria   MS:  No joint pain or swelling.  No decreased range of motion.  No back pain.  Psych:  No change in mood or affect. No depression or anxiety.  No memory loss.          Objective:   Physical Exam GEN: A/Ox3; pleasant , NAD, well nourished   HEENT:  /AT,  EACs-clear, TMs-wnl, NOSE-clear, THROAT-clear, no lesions, no postnasal drip or exudate noted.   NECK:  Supple w/ fair ROM; no JVD; normal carotid impulses w/o bruits; no thyromegaly or nodules palpated; no lymphadenopathy.  RESP  Clear  P & A; w/o, wheezes/ rales/ or rhonchi.no accessory muscle use, no dullness to percussion  CARD:  RRR, no m/r/g  , no peripheral edema, pulses intact, no cyanosis or clubbing.  GI:   Soft & nt; nml bowel sounds; no organomegaly or masses detected.  Musco: Warm bil, no deformities or joint swelling noted.   Neuro: alert, no focal deficits noted.    Skin: Warm, no lesions or rashes         Assessment & Plan:

## 2013-03-29 NOTE — Progress Notes (Signed)
Reviewed and agree with assessment/plan. 

## 2013-03-29 NOTE — Patient Instructions (Addendum)
Continue on Xarelto 15mg  through 04/04/13.  Begin Xarelto 20mg  daily on 04/05/13 .  Call if develop any bleeding.  Call office if menses worsens or does not regulate.  Will need to set up follow up with OB/GYN as discussed  Follow up Dr. Halford Chessman  In 4-6 weeks and As needed

## 2013-04-05 ENCOUNTER — Telehealth: Payer: Self-pay | Admitting: Pulmonary Disease

## 2013-04-05 MED ORDER — RIVAROXABAN 20 MG PO TABS: 20.0000 mg | ORAL_TABLET | Freq: Every day | ORAL | Status: DC

## 2013-04-05 NOTE — Telephone Encounter (Signed)
Does insurance not cover, does she know what is on her formulary??   We have some samples that we can put up front until we can figure this out either thru insurance /formulary  Also have a coupon card. -that covers total cost of co-pay  Does she have insurance????

## 2013-04-05 NOTE — Telephone Encounter (Signed)
LMTCB x 1 

## 2013-04-05 NOTE — Telephone Encounter (Signed)
Per OV with TP: Patient Instructions      Continue on Xarelto 15mg  through 04/04/13.   Begin Xarelto 20mg  daily on 04/05/13 .   Call if develop any bleeding.   Call office if menses worsens or does not regulate.   Will need to set up follow up with OB/GYN as discussed   Follow up Dr. Halford Chessman  In 4-6 weeks and As needed   ----  Called and spoke with pt. She reports the zarelto is over $300 and she can't afford this. She is needing an alternative. Please advise TP thanks

## 2013-04-05 NOTE — Telephone Encounter (Signed)
3 boxes of Xarelto 20mg  and coupon card placed up front for pt per TP's request Samples documented per protocol

## 2013-04-05 NOTE — Telephone Encounter (Signed)
Marissa [name] has left samples and a discount card up front for the pt to pick these up.  i have called the pt back to make her aware.  She will get her formulary from her insurance company and drop this off at our office.  Nothing further is needed.

## 2013-04-18 ENCOUNTER — Telehealth: Payer: Self-pay | Admitting: Pulmonary Disease

## 2013-04-18 NOTE — Telephone Encounter (Signed)
Pt was informed to contact gyno doctor.  Nothing further was needed at this time.

## 2013-04-18 NOTE — Telephone Encounter (Signed)
Pt on Xarelto 20mg  for PE Per the 2.5.15 ov note with TP, pt denied any bleeding other than heavy menses Is this the bleeding she is referring to?  May need to contact her GYN LMOM TCB x1

## 2013-04-18 NOTE — Telephone Encounter (Signed)
Patient returning call.

## 2013-04-18 NOTE — Telephone Encounter (Signed)
Pt returned call  Pt request a phone call back after 4:30

## 2013-04-18 NOTE — Telephone Encounter (Signed)
I called and spoke with pt. She reports she has had heavy menstrual bleeding x 1 month. She reports once she passes a blod clot the heaviness lightens up. Usually about once daily. She is taking the xarelto 20 mg and was told if she was still having this problem to call us. She has not contacted her GYN. Is this what she needs to do VS? Please advise thanks

## 2013-04-18 NOTE — Telephone Encounter (Signed)
She needs to contact her gynecologist to discuss further evaluation of her menstrual bleeding.

## 2013-04-18 NOTE — Telephone Encounter (Signed)
lmomtcb x1 

## 2013-04-18 NOTE — Telephone Encounter (Signed)
Pt did request below to be called back after 4:30pm today.  However, given that VS's recs are for her to contact her GYN, went ahead and left another message for pt to call back.

## 2013-04-20 ENCOUNTER — Telehealth: Payer: Self-pay | Admitting: Gynecology

## 2013-04-20 ENCOUNTER — Telehealth: Payer: Self-pay | Admitting: *Deleted

## 2013-04-20 MED ORDER — MEGESTROL ACETATE 40 MG PO TABS: 40.0000 mg | ORAL_TABLET | Freq: Two times a day (BID) | ORAL | Status: DC

## 2013-04-20 NOTE — Telephone Encounter (Signed)
Patient had called the office today stating that she was having vaginal bleeding after her Nuvaring  had been removed because of her  recent episode of bilateral pulmonary embolism and left DVT in January this year. Patient is currently on Xarelto. I had discussed her case with the hematologist oncologist Dr. Charlesetta Garibaldi today to see if she would be a candidate for Mirena IUD but prior to have it inserted to place her on an anti-estrogen such as Megace 40 mg twice a day for 7 days. Since she is anticoagulated he did not see any contraindication to this regimen to help with her bleeding. This will be related to the patient. We'll make arrangements to place a Mirena IUD next week.

## 2013-04-20 NOTE — Telephone Encounter (Signed)
Pt was diagnosed with PE on 03/13/13 due to nuvaring, no longer using nuvaring. Pt said on 03/20/13 she started bleeding and has been bleeding since then heavy with cramps. Pt asked if something could be given to help with the bleeding. Please advise

## 2013-04-20 NOTE — Telephone Encounter (Addendum)
Per JF okay for patient to have megace 40 mg BID # 14 and to consider having Mirena IUD, I will relay to wendy to recheck benefits and call her with information. Pt informed, rx sent.

## 2013-04-27 ENCOUNTER — Telehealth: Payer: Self-pay | Admitting: *Deleted

## 2013-04-27 ENCOUNTER — Telehealth: Payer: Self-pay | Admitting: Gynecology

## 2013-04-27 NOTE — Telephone Encounter (Signed)
04/27/13-Called pt to advise her that her insurance would cover the Mirena IUD at 100%. She states that her pulmonary doctor has said she cannot use any birth control/WL

## 2013-04-27 NOTE — Telephone Encounter (Signed)
Patient was informed that Mirena IUD was covered at 100% per Abigail Butts, I called patient to see if she would like to schedule. Pt declined at the time due to her father passing. She will call back with decision.

## 2013-04-30 ENCOUNTER — Ambulatory Visit (INDEPENDENT_AMBULATORY_CARE_PROVIDER_SITE_OTHER): Payer: No Typology Code available for payment source | Admitting: Pulmonary Disease

## 2013-04-30 ENCOUNTER — Other Ambulatory Visit (INDEPENDENT_AMBULATORY_CARE_PROVIDER_SITE_OTHER): Payer: No Typology Code available for payment source

## 2013-04-30 ENCOUNTER — Encounter: Payer: Self-pay | Admitting: Pulmonary Disease

## 2013-04-30 VITALS — BP 112/74 | HR 78 | Temp 98.7°F | Ht 66.0 in | Wt 201.0 lb

## 2013-04-30 DIAGNOSIS — I2699 Other pulmonary embolism without acute cor pulmonale: Secondary | ICD-10-CM

## 2013-04-30 LAB — COMPREHENSIVE METABOLIC PANEL
ALT: 12 U/L (ref 0–35)
AST: 14 U/L (ref 0–37)
Albumin: 3.8 g/dL (ref 3.5–5.2)
Alkaline Phosphatase: 68 U/L (ref 39–117)
BUN: 9 mg/dL (ref 6–23)
CO2: 25 mEq/L (ref 19–32)
Calcium: 9.2 mg/dL (ref 8.4–10.5)
Chloride: 108 mEq/L (ref 96–112)
Creatinine, Ser: 1 mg/dL (ref 0.4–1.2)
GFR: 75.76 mL/min (ref >60.00)
Glucose, Bld: 127 mg/dL — ABNORMAL HIGH (ref 70–99)
Potassium: 3.5 mEq/L (ref 3.5–5.1)
Sodium: 139 mEq/L (ref 135–145)
Total Bilirubin: 0.2 mg/dL — ABNORMAL LOW (ref 0.3–1.2)
Total Protein: 7.3 g/dL (ref 6.0–8.3)

## 2013-04-30 LAB — CBC
HCT: 27.4 % — ABNORMAL LOW (ref 36.0–46.0)
Hemoglobin: 8.6 g/dL — ABNORMAL LOW (ref 12.0–15.0)
MCHC: 31.4 g/dL (ref 30.0–36.0)
MCV: 85.4 fl (ref 78.0–100.0)
Platelets: 356 10*3/uL (ref 150.0–400.0)
RBC: 3.21 Mil/uL — ABNORMAL LOW (ref 3.87–5.11)
RDW: 14.9 % — ABNORMAL HIGH (ref 11.5–14.6)
WBC: 4.4 10*3/uL — ABNORMAL LOW (ref 4.5–10.5)

## 2013-04-30 NOTE — Progress Notes (Signed)
Chief Complaint  Patient presents with  . Follow-up    Reports DOE at times. Otherwise breathing is doing well. Denies coughing, chest tightness.    History of Present Illness: ROSELINA BURGUENO is a 43 y.o. female with PE dx January 2015 while on birth control.  She is doing well.  She denies chest pain, palpitations, or leg swelling.  She gets winded when she walks up stairs, but otherwise does okay.  She is not having vaginal bleeding anymore.  She will f/u with Gyn to discuss contraceptive options, but is not interested in using medications for this.   TESTS: CT chest 03/13/13 >> Positive for acute PE with CT evidence of right heartstrain (RV/LV Ratio = 1.18) consistent with at least submassive (intermediate risk)PE.  Hypercoagulable panel 03/13/13 >> negative Doppler legs 03/14/13 >> acute DVT Lt popliteal and posterior tibial veins Echo 03/14/13 >> EF 50 to 55%  Micheline Chapman  has a past medical history of Pulmonary embolism (March 13, 2013).  Micheline Chapman  has past surgical history that includes Abdominal surgery (1990); Laparoscopic cholecystectomy (03-28-2008); and Dilation and evacuation (N/A, 11/28/2012).  Prior to Admission medications   Medication Sig Start Date End Date Taking? Authorizing Provider  Rivaroxaban (XARELTO) 20 MG TABS tablet Take 1 tablet (20 mg total) by mouth daily with supper. Begin on 04/05/2013 03/29/13  Yes Tammy Jeralene Huff, NP    Allergies  Allergen Reactions  . Codeine Nausea Only     Physical Exam:  General - No distress ENT - No sinus tenderness, no oral exudate, no LAN, scalloped tongue, MP 3 Cardiac - s1s2 regular, no murmur Chest - No wheeze/rales/dullness Back - No focal tenderness Abd - Soft, non-tender Ext - No edema Neuro - Normal strength Skin - No rashes Psych - normal mood, and behavior   Assessment/Plan:  Chesley Mires, MD [facility]/Critical Care/Sleep Pager:  [phone]

## 2013-04-30 NOTE — Patient Instructions (Signed)
Lab tests today  Follow up in June 2015

## 2013-04-30 NOTE — Assessment & Plan Note (Addendum)
Associated with left leg DVT.  This was in January 2015 while on nuvaring, and immobile due to Lt leg injury.  Hypercoagulable w/u negative.  Will continue xarelto 20 mg daily.  Will repeat lab work today.  F/u in June 2015 and then decide about further plans for anti-coagulation.

## 2013-05-01 ENCOUNTER — Telehealth: Payer: Self-pay | Admitting: Pulmonary Disease

## 2013-05-01 DIAGNOSIS — I2699 Other pulmonary embolism without acute cor pulmonale: Secondary | ICD-10-CM

## 2013-05-01 DIAGNOSIS — Z0000 Encounter for general adult medical examination without abnormal findings: Secondary | ICD-10-CM

## 2013-05-01 NOTE — Telephone Encounter (Signed)
CBC    Component Value Date/Time   WBC 4.4* 04/30/2013 1429   RBC 3.21* 04/30/2013 1429   HGB 8.6 Repeated and verified X2.* 04/30/2013 1429   HCT 27.4* 04/30/2013 1429   PLT 356.0 04/30/2013 1429   MCV 85.4 04/30/2013 1429   MCHC 31.4 04/30/2013 1429   RDW 14.9* 04/30/2013 1429    CMP     Component Value Date/Time   NA 139 04/30/2013 1429   K 3.5 04/30/2013 1429   CL 108 04/30/2013 1429   CO2 25 04/30/2013 1429   GLUCOSE 127* 04/30/2013 1429   BUN 9 04/30/2013 1429   CREATININE 1.0 04/30/2013 1429   CALCIUM 9.2 04/30/2013 1429   PROT 7.3 04/30/2013 1429   ALBUMIN 3.8 04/30/2013 1429   AST 14 04/30/2013 1429   ALT 12 04/30/2013 1429   ALKPHOS 68 04/30/2013 1429   BILITOT 0.2* 04/30/2013 1429    Results d/w pt.  She was taking iron supplementation, but has stopped this.  She denies abdominal pain, melena, blood in stool, or vaginal bleeding.  Advised her to resume iron supplementation.  Will also have my nurse schedule repeat CBC for next week.  She is in need of primary care physician for health maintenance.  Will have [facility] arrange for this.

## 2013-05-02 NOTE — Telephone Encounter (Signed)
Pt is aware that we need to repeat CBC. Order will be placed. She will come by next week to have this done.

## 2013-05-02 NOTE — Telephone Encounter (Signed)
lmtcb x1 

## 2013-08-03 ENCOUNTER — Ambulatory Visit (INDEPENDENT_AMBULATORY_CARE_PROVIDER_SITE_OTHER): Payer: No Typology Code available for payment source | Admitting: Pulmonary Disease

## 2013-08-03 ENCOUNTER — Other Ambulatory Visit (INDEPENDENT_AMBULATORY_CARE_PROVIDER_SITE_OTHER): Payer: No Typology Code available for payment source

## 2013-08-03 ENCOUNTER — Encounter: Payer: Self-pay | Admitting: Pulmonary Disease

## 2013-08-03 VITALS — BP 110/80 | HR 89 | Temp 99.2°F | Ht 66.0 in | Wt 213.6 lb

## 2013-08-03 DIAGNOSIS — I2699 Other pulmonary embolism without acute cor pulmonale: Secondary | ICD-10-CM

## 2013-08-03 DIAGNOSIS — Z0000 Encounter for general adult medical examination without abnormal findings: Secondary | ICD-10-CM

## 2013-08-03 DIAGNOSIS — D649 Anemia, unspecified: Secondary | ICD-10-CM

## 2013-08-03 LAB — CBC
HCT: 27.3 % — ABNORMAL LOW (ref 36.0–46.0)
Hemoglobin: 8.7 g/dL — ABNORMAL LOW (ref 12.0–15.0)
MCHC: 31.8 g/dL (ref 30.0–36.0)
MCV: 81.6 fl (ref 78.0–100.0)
Platelets: 371 10*3/uL (ref 150.0–400.0)
RBC: 3.35 Mil/uL — ABNORMAL LOW (ref 3.87–5.11)
RDW: 17.5 % — ABNORMAL HIGH (ref 11.5–15.5)
WBC: 4.8 10*3/uL (ref 4.0–10.5)

## 2013-08-03 NOTE — Progress Notes (Signed)
Chief Complaint  Patient presents with  . Follow-up    No change/No complaints.    History of Present Illness: Marissa [name] is a 43 y.o. female with PE dx January 2015 while on birth control.  She has been doing well.  She denies chest pain, cough, dyspnea, palpitations, or rashes.  She gets ankle swelling when she stands on her feet too long >> this goes away when she keeps her legs up.  TESTS: CT chest 03/13/13 >> Positive for acute PE with CT evidence of right heartstrain (RV/LV Ratio = 1.18) consistent with at least submassive (intermediate risk)PE.  Hypercoagulable panel 03/13/13 >> negative Doppler legs 03/14/13 >> acute DVT Lt popliteal and posterior tibial veins Echo 03/14/13 >> EF 50 to 55% 08/03/13 >> stopped xarelto  Marissa [name]  has a past medical history of Pulmonary embolism (March 13, 2013).  Marissa [name]  has past surgical history that includes Abdominal surgery (1990); Laparoscopic cholecystectomy (03-28-2008); and Dilation and evacuation (N/A, 11/28/2012).  Prior to Admission medications   Medication Sig Start Date End Date Taking? Authorizing Provider  Rivaroxaban (XARELTO) 20 MG TABS tablet Take 1 tablet (20 mg total) by mouth daily with supper. Begin on 04/05/2013 03/29/13  Yes Tammy Jeralene Huff, NP    Allergies  Allergen Reactions  . Codeine Nausea Only     Physical Exam:  General - No distress ENT - No sinus tenderness, no oral exudate, no LAN, scalloped tongue, MP 3 Cardiac - s1s2 regular, no murmur Chest - No wheeze/rales/dullness Back - No focal tenderness Abd - Soft, non-tender Ext - No edema Neuro - Normal strength Skin - No rashes Psych - normal mood, and behavior   Assessment/Plan:  Chesley Mires, MD  Pulmonary/Critical Care/Sleep Pager:  [phone]

## 2013-08-03 NOTE — Assessment & Plan Note (Signed)
Advised her to call if she needs assistance arranging for a primary care provider.

## 2013-08-03 NOTE — Patient Instructions (Signed)
Stop xarelto Lab tests today >> will call with results Call if you need help getting a primary care doctor Follow up with pulmonary as needed

## 2013-08-03 NOTE — Assessment & Plan Note (Signed)
Related to menstrual bleeding.  She has been taking iron supplements.  Will check her CBC and call her with results.

## 2013-08-03 NOTE — Assessment & Plan Note (Signed)
She had provoked VTE related to use of birth control.  She has completed 6 months of xarelto.  I explained to her at this point risk of bleeding from being on anti-coagulation outweighs benefit from continued therapy.  Will have her stop xarelto.  Discussed warning symptoms to monitor for that could be indicative of recurrent VTE, and advised her to call or go to the ER if there is concern for recurrent VTE.

## 2013-08-10 ENCOUNTER — Telehealth: Payer: Self-pay | Admitting: Pulmonary Disease

## 2013-08-10 NOTE — Telephone Encounter (Signed)
CBC    Component Value Date/Time   WBC 4.8 08/03/2013 1714   RBC 3.35* 08/03/2013 1714   HGB 8.7 Repeated and verified X2.* 08/03/2013 1714   HCT 27.3* 08/03/2013 1714   PLT 371.0 08/03/2013 1714   MCV 81.6 08/03/2013 1714   MCHC 31.8 08/03/2013 1714   RDW 17.5* 08/03/2013 1714    Will have my nurse inform pt that she is still anemic.  She should continue with iron supplements.  She will need to have this followed up by her gynecologist and PCP.

## 2013-08-10 NOTE — Telephone Encounter (Signed)
LMOM x 1 

## 2013-08-13 NOTE — Telephone Encounter (Signed)
Pt returned call

## 2013-08-13 NOTE — Telephone Encounter (Signed)
Results have been explained to patient, pt expressed understanding. Nothing further needed.  

## 2013-08-13 NOTE — Telephone Encounter (Signed)
Pt call bk from 6/19 for lab results.Marissa [name]

## 2013-08-13 NOTE — Telephone Encounter (Signed)
LMOM x 2 

## 2013-08-31 ENCOUNTER — Other Ambulatory Visit: Payer: Self-pay | Admitting: Women's Health

## 2013-08-31 DIAGNOSIS — Z1231 Encounter for screening mammogram for malignant neoplasm of breast: Secondary | ICD-10-CM

## 2013-10-04 ENCOUNTER — Ambulatory Visit: Payer: No Typology Code available for payment source | Admitting: Family

## 2013-10-11 ENCOUNTER — Encounter: Payer: Self-pay | Admitting: Women's Health

## 2013-10-11 ENCOUNTER — Ambulatory Visit (HOSPITAL_COMMUNITY)
Admission: RE | Admit: 2013-10-11 | Discharge: 2013-10-11 | Disposition: A | Discharge status: Home / Self Care | Payer: No Typology Code available for payment source | Source: Ambulatory Visit | Attending: Women's Health | Admitting: Women's Health

## 2013-10-11 DIAGNOSIS — Z1231 Encounter for screening mammogram for malignant neoplasm of breast: Secondary | ICD-10-CM | POA: Diagnosis not present

## 2013-10-23 ENCOUNTER — Encounter: Payer: Self-pay | Admitting: Family

## 2013-10-23 ENCOUNTER — Ambulatory Visit (INDEPENDENT_AMBULATORY_CARE_PROVIDER_SITE_OTHER): Payer: No Typology Code available for payment source | Admitting: Family

## 2013-10-23 VITALS — BP 118/78 | HR 91 | Ht 66.0 in | Wt 214.3 lb

## 2013-10-23 DIAGNOSIS — R609 Edema, unspecified: Secondary | ICD-10-CM

## 2013-10-23 DIAGNOSIS — Z86711 Personal history of pulmonary embolism: Secondary | ICD-10-CM

## 2013-10-23 LAB — COMPREHENSIVE METABOLIC PANEL
ALT: 14 U/L (ref 0–35)
AST: 18 U/L (ref 0–37)
Albumin: 3.6 g/dL (ref 3.5–5.2)
Alkaline Phosphatase: 81 U/L (ref 39–117)
BUN: 10 mg/dL (ref 6–23)
CO2: 28 mEq/L (ref 19–32)
Calcium: 9.1 mg/dL (ref 8.4–10.5)
Chloride: 106 mEq/L (ref 96–112)
Creatinine, Ser: 1 mg/dL (ref 0.4–1.2)
GFR: 75.59 mL/min (ref >60.00)
Glucose, Bld: 96 mg/dL (ref 70–99)
Potassium: 4 mEq/L (ref 3.5–5.1)
Sodium: 139 mEq/L (ref 135–145)
Total Bilirubin: 0.6 mg/dL (ref 0.2–1.2)
Total Protein: 7.3 g/dL (ref 6.0–8.3)

## 2013-10-23 LAB — POCT URINALYSIS DIPSTICK
Bilirubin, UA: NEGATIVE
Glucose, UA: NEGATIVE
Ketones, UA: NEGATIVE
Nitrite, UA: NEGATIVE
Protein, UA: NEGATIVE
Spec Grav, UA: 1.01
Urobilinogen, UA: 1
pH, UA: 7.5

## 2013-10-23 MED ORDER — FUROSEMIDE 20 MG PO TABS: 20.0000 mg | ORAL_TABLET | Freq: Every day | ORAL | Status: DC

## 2013-10-23 NOTE — Patient Instructions (Signed)

## 2013-10-23 NOTE — Progress Notes (Signed)
Pre visit review using our clinic review tool, if applicable. No additional management support is needed unless otherwise documented below in the visit note. 

## 2013-10-24 ENCOUNTER — Encounter: Payer: Self-pay | Admitting: Family
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
## 2013-11-20 ENCOUNTER — Telehealth: Payer: Self-pay | Admitting: Family

## 2013-11-20 ENCOUNTER — Ambulatory Visit (INDEPENDENT_AMBULATORY_CARE_PROVIDER_SITE_OTHER): Payer: No Typology Code available for payment source | Admitting: Family

## 2013-11-20 ENCOUNTER — Encounter: Payer: Self-pay | Admitting: Family

## 2013-11-20 VITALS — BP 100/70 | HR 80 | Ht 66.0 in | Wt 215.5 lb

## 2013-11-20 DIAGNOSIS — R609 Edema, unspecified: Secondary | ICD-10-CM

## 2013-11-20 DIAGNOSIS — Z23 Encounter for immunization: Secondary | ICD-10-CM

## 2013-11-20 DIAGNOSIS — Z0000 Encounter for general adult medical examination without abnormal findings: Secondary | ICD-10-CM
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
## 2013-11-20 NOTE — Telephone Encounter (Signed)
Fyi; Ms Marissa [name] can not come on 11/21/13 for fasting lab. She can come  on Thursday 11/22/13 so I scheduled her for that day .

## 2013-11-20 NOTE — Patient Instructions (Signed)
Exercise to Lose Weight Exercise and a healthy diet may help you lose weight. Your doctor may suggest specific exercises. EXERCISE IDEAS AND TIPS  Choose low-cost things you enjoy doing, such as walking, bicycling, or exercising to workout videos.  Take stairs instead of the elevator.  Walk during your lunch break.  Park your car further away from work or school.  Go to a gym or an exercise class.  Start with 5 to 10 minutes of exercise each day. Build up to 30 minutes of exercise 4 to 6 days a week.  Wear shoes with good support and comfortable clothes.  Stretch before and after working out.  Work out until you breathe harder and your heart beats faster.  Drink extra water when you exercise.  Do not do so much that you hurt yourself, feel dizzy, or get very short of breath. Exercises that burn about 150 calories:  Running 1  miles in 15 minutes.  Playing volleyball for 45 to 60 minutes.  Washing and waxing a car for 45 to 60 minutes.  Playing touch football for 45 minutes.  Walking 1  miles in 35 minutes.  Pushing a stroller 1  miles in 30 minutes.  Playing basketball for 30 minutes.  Raking leaves for 30 minutes.  Bicycling 5 miles in 30 minutes.  Walking 2 miles in 30 minutes.  Dancing for 30 minutes.  Shoveling snow for 15 minutes.  Swimming laps for 20 minutes.  Walking up stairs for 15 minutes.  Bicycling 4 miles in 15 minutes.  Gardening for 30 to 45 minutes.  Jumping rope for 15 minutes.  Washing windows or floors for 45 to 60 minutes. Document Released: 03/13/2010 Document Revised: 05/03/2011 Document Reviewed: 03/13/2010 Exit[employer] Patient Information 2015 [employer]. This information is not intended to replace advice given to you by your health care provider. Make sure you discuss any questions you have with your health care provider.  

## 2013-11-20 NOTE — Progress Notes (Signed)
Pre visit review using our clinic review tool, if applicable. No additional management support is needed unless otherwise documented below in the visit note. 

## 2013-11-20 NOTE — Telephone Encounter (Signed)
That is fine 

## 2013-11-22 ENCOUNTER — Other Ambulatory Visit (INDEPENDENT_AMBULATORY_CARE_PROVIDER_SITE_OTHER): Payer: No Typology Code available for payment source

## 2013-11-22 DIAGNOSIS — Z0000 Encounter for general adult medical examination without abnormal findings: Secondary | ICD-10-CM

## 2013-11-22 DIAGNOSIS — R609 Edema, unspecified: Secondary | ICD-10-CM

## 2013-11-22 LAB — COMPREHENSIVE METABOLIC PANEL
ALT: 15 U/L (ref 0–35)
AST: 18 U/L (ref 0–37)
Albumin: 3.4 g/dL — ABNORMAL LOW (ref 3.5–5.2)
Alkaline Phosphatase: 72 U/L (ref 39–117)
BUN: 8 mg/dL (ref 6–23)
CO2: 27 mEq/L (ref 19–32)
Calcium: 8.9 mg/dL (ref 8.4–10.5)
Chloride: 107 mEq/L (ref 96–112)
Creatinine, Ser: 1 mg/dL (ref 0.4–1.2)
GFR: 82.02 mL/min (ref >60.00)
Glucose, Bld: 90 mg/dL (ref 70–99)
Potassium: 3.5 mEq/L (ref 3.5–5.1)
Sodium: 138 mEq/L (ref 135–145)
Total Bilirubin: 0.1 mg/dL — ABNORMAL LOW (ref 0.2–1.2)
Total Protein: 7 g/dL (ref 6.0–8.3)

## 2013-11-22 LAB — POCT URINALYSIS DIPSTICK
Bilirubin, UA: NEGATIVE
Glucose, UA: NEGATIVE
Ketones, UA: NEGATIVE
Nitrite, UA: NEGATIVE
Protein, UA: NEGATIVE
Spec Grav, UA: <=1.005
Urobilinogen, UA: 0.2
pH, UA: 5.5

## 2013-11-22 LAB — LIPID PANEL
Cholesterol: 140 mg/dL (ref 0–200)
HDL: 50.3 mg/dL (ref >39.00)
LDL Cholesterol: 72 mg/dL (ref 0–99)
NonHDL: 89.7
Total CHOL/HDL Ratio: 3
Triglycerides: 87 mg/dL (ref 0.0–149.0)
VLDL: 17.4 mg/dL (ref 0.0–40.0)

## 2013-11-22 LAB — CBC WITH DIFFERENTIAL/PLATELET
Basophils Absolute: 0 10*3/uL (ref 0.0–0.1)
Basophils Relative: 0.4 % (ref 0.0–3.0)
Eosinophils Absolute: 0 10*3/uL (ref 0.0–0.7)
Eosinophils Relative: 0.9 % (ref 0.0–5.0)
HCT: 30 % — ABNORMAL LOW (ref 36.0–46.0)
Hemoglobin: 9.8 g/dL — ABNORMAL LOW (ref 12.0–15.0)
Lymphocytes Relative: 29.2 % (ref 12.0–46.0)
Lymphs Abs: 1.1 10*3/uL (ref 0.7–4.0)
MCHC: 32.7 g/dL (ref 30.0–36.0)
MCV: 81 fl (ref 78.0–100.0)
Monocytes Absolute: 0.3 10*3/uL (ref 0.1–1.0)
Monocytes Relative: 7.7 % (ref 3.0–12.0)
Neutro Abs: 2.3 10*3/uL (ref 1.4–7.7)
Neutrophils Relative %: 61.8 % (ref 43.0–77.0)
Platelets: 246 10*3/uL (ref 150.0–400.0)
RBC: 3.7 Mil/uL — ABNORMAL LOW (ref 3.87–5.11)
RDW: 17.1 % — ABNORMAL HIGH (ref 11.5–15.5)
WBC: 3.8 10*3/uL — ABNORMAL LOW (ref 4.0–10.5)

## 2013-11-22 LAB — TSH: TSH: 0.31 u[IU]/mL — ABNORMAL LOW (ref 0.35–4.50)

## 2013-11-23 ENCOUNTER — Other Ambulatory Visit (INDEPENDENT_AMBULATORY_CARE_PROVIDER_SITE_OTHER): Payer: No Typology Code available for payment source

## 2013-11-23 DIAGNOSIS — R946 Abnormal results of thyroid function studies: Secondary | ICD-10-CM

## 2013-11-23 LAB — T3, FREE: T3, Free: 2.7 pg/mL (ref 2.3–4.2)

## 2013-11-23 LAB — T4, FREE: Free T4: 0.9 ng/dL (ref 0.60–1.60)

## 2013-11-29 ENCOUNTER — Encounter: Payer: No Typology Code available for payment source | Admitting: Family

## 2013-12-24 ENCOUNTER — Encounter: Payer: Self-pay | Admitting: Family

## 2015-06-18 ENCOUNTER — Other Ambulatory Visit: Payer: Self-pay

## 2015-06-18 DIAGNOSIS — Z1231 Encounter for screening mammogram for malignant neoplasm of breast: Secondary | ICD-10-CM

## 2015-07-11 ENCOUNTER — Ambulatory Visit
Admission: RE | Admit: 2015-07-11 | Discharge: 2015-07-11 | Disposition: A | Discharge status: Home / Self Care | Payer: BLUE CROSS/BLUE SHIELD | Source: Ambulatory Visit

## 2015-07-11 DIAGNOSIS — Z1231 Encounter for screening mammogram for malignant neoplasm of breast: Secondary | ICD-10-CM

## 2015-07-15 ENCOUNTER — Other Ambulatory Visit: Payer: Self-pay | Admitting: Gynecology

## 2015-07-15 DIAGNOSIS — R928 Other abnormal and inconclusive findings on diagnostic imaging of breast: Secondary | ICD-10-CM

## 2015-07-17 ENCOUNTER — Ambulatory Visit
Admission: RE | Admit: 2015-07-17 | Discharge: 2015-07-17 | Disposition: A | Discharge status: Home / Self Care | Payer: BLUE CROSS/BLUE SHIELD | Source: Ambulatory Visit | Attending: Gynecology | Admitting: Gynecology

## 2015-07-17 DIAGNOSIS — R928 Other abnormal and inconclusive findings on diagnostic imaging of breast: Secondary | ICD-10-CM

## 2015-08-21 ENCOUNTER — Ambulatory Visit (INDEPENDENT_AMBULATORY_CARE_PROVIDER_SITE_OTHER): Payer: BLUE CROSS/BLUE SHIELD | Admitting: Women's Health

## 2015-08-21 ENCOUNTER — Encounter: Payer: Self-pay | Admitting: Women's Health

## 2015-08-21 VITALS — BP 120/82 | Ht 66.0 in | Wt 218.0 lb

## 2015-08-21 DIAGNOSIS — Z13220 Encounter for screening for lipoid disorders: Secondary | ICD-10-CM

## 2015-08-21 DIAGNOSIS — Z01419 Encounter for gynecological examination (general) (routine) without abnormal findings: Secondary | ICD-10-CM

## 2015-08-21 DIAGNOSIS — Z1329 Encounter for screening for other suspected endocrine disorder: Secondary | ICD-10-CM | POA: Diagnosis not present

## 2015-08-21 LAB — COMPREHENSIVE METABOLIC PANEL
ALT: 27 U/L (ref 6–29)
AST: 26 U/L (ref 10–35)
Albumin: 3.8 g/dL (ref 3.6–5.1)
Alkaline Phosphatase: 76 U/L (ref 33–115)
BUN: 10 mg/dL (ref 7–25)
CO2: 26 mmol/L (ref 20–31)
Calcium: 9.4 mg/dL (ref 8.6–10.2)
Chloride: 104 mmol/L (ref 98–110)
Creat: 0.97 mg/dL (ref 0.50–1.10)
Glucose, Bld: 92 mg/dL (ref 65–99)
Potassium: 4.2 mmol/L (ref 3.5–5.3)
Sodium: 140 mmol/L (ref 135–146)
Total Bilirubin: 0.2 mg/dL (ref 0.2–1.2)
Total Protein: 6.7 g/dL (ref 6.1–8.1)

## 2015-08-21 LAB — CBC WITH DIFFERENTIAL/PLATELET
Basophils Absolute: 0 cells/uL (ref 0–200)
Basophils Relative: 0 %
Eosinophils Absolute: 132 cells/uL (ref 15–500)
Eosinophils Relative: 4 %
HCT: 34 % — ABNORMAL LOW (ref 35.0–45.0)
Hemoglobin: 10.8 g/dL — ABNORMAL LOW (ref 11.7–15.5)
Lymphocytes Relative: 37 %
Lymphs Abs: 1221 cells/uL (ref 850–3900)
MCH: 27.1 pg (ref 27.0–33.0)
MCHC: 31.8 g/dL — ABNORMAL LOW (ref 32.0–36.0)
MCV: 85.4 fL (ref 80.0–100.0)
MPV: 9.8 fL (ref 7.5–12.5)
Monocytes Absolute: 231 cells/uL (ref 200–950)
Monocytes Relative: 7 %
Neutro Abs: 1716 cells/uL (ref 1500–7800)
Neutrophils Relative %: 52 %
Platelets: 285 10*3/uL (ref 140–400)
RBC: 3.98 MIL/uL (ref 3.80–5.10)
RDW: 15.2 % — ABNORMAL HIGH (ref 11.0–15.0)
WBC: 3.3 10*3/uL — ABNORMAL LOW (ref 3.8–10.8)

## 2015-08-21 LAB — TSH: TSH: 0.65 mIU/L

## 2015-08-21 LAB — LIPID PANEL
Cholesterol: 140 mg/dL (ref 125–200)
HDL: 49 mg/dL (ref >=46)
LDL Cholesterol: 75 mg/dL (ref <130)
Total CHOL/HDL Ratio: 2.9 Ratio (ref <=5.0)
Triglycerides: 82 mg/dL (ref <150)
VLDL: 16 mg/dL (ref <30)
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
## 2015-08-21 NOTE — Patient Instructions (Signed)
Basic Carbohydrate Counting for Diabetes Mellitus Carbohydrate counting is a method for keeping track of the amount of carbohydrates you eat. Eating carbohydrates naturally increases the level of sugar (glucose) in your blood, so it is important for you to know the amount that is okay for you to have in every meal. Carbohydrate counting helps keep the level of glucose in your blood within normal limits. The amount of carbohydrates allowed is different for every person. A dietitian can help you calculate the amount that is right for you. Once you know the amount of carbohydrates you can have, you can count the carbohydrates in the foods you want to eat. Carbohydrates are found in the following foods:  Grains, such as breads and cereals.  Dried beans and soy products.  Starchy vegetables, such as potatoes, peas, and corn.  Fruit and fruit juices.  Milk and yogurt.  Sweets and snack foods, such as cake, cookies, candy, chips, soft drinks, and fruit drinks. CARBOHYDRATE COUNTING There are two ways to count the carbohydrates in your food. You can use either of the methods or a combination of both. Reading the "Nutrition Facts" on [place] The "Nutrition Facts" is an area that is included on the labels of almost all packaged food and beverages in the [place]. It includes the serving size of that food or beverage and information about the nutrients in each serving of the food, including the grams (g) of carbohydrate per serving.  Decide the number of servings of this food or beverage that you will be able to eat or drink. Multiply that number of servings by the number of grams of carbohydrate that is listed on the label for that serving. The total will be the amount of carbohydrates you will be having when you eat or drink this food or beverage. Learning Standard Serving Sizes of Food When you eat food that is not packaged or does not include "Nutrition Facts" on the label, you need to  measure the servings in order to count the amount of carbohydrates.A serving of most carbohydrate-rich foods contains about 15 g of carbohydrates. The following list includes serving sizes of carbohydrate-rich foods that provide 15 g ofcarbohydrate per serving:   1 slice of bread (1 oz) or 1 six-inch tortilla.    of a hamburger bun or English muffin.  4-6 crackers.   cup unsweetened dry cereal.    cup hot cereal.   cup rice or pasta.    cup mashed potatoes or  of a large baked potato.  1 cup fresh fruit or one small piece of fruit.    cup canned or frozen fruit or fruit juice.  1 cup milk.   cup plain fat-free yogurt or yogurt sweetened with artificial sweeteners.   cup cooked dried beans or starchy vegetable, such as peas, corn, or potatoes.  Decide the number of standard-size servings that you will eat. Multiply that number of servings by 15 (the grams of carbohydrates in that serving). For example, if you eat 2 cups of strawberries, you will have eaten 2 servings and 30 g of carbohydrates (2 servings x 15 g = 30 g). For foods such as soups and casseroles, in which more than one food is mixed in, you will need to count the carbohydrates in each food that is included. EXAMPLE OF CARBOHYDRATE COUNTING Sample Dinner  3 oz chicken breast.   cup of brown rice.   cup of corn.  1 cup milk.   1 cup strawberries with  sugar-free whipped topping.  Carbohydrate Calculation Step 1: Identify the foods that contain carbohydrates:   Rice.   Corn.   Milk.   Strawberries. Step 2:Calculate the number of servings eaten of each:   2 servings of rice.   1 serving of corn.   1 serving of milk.   1 serving of strawberries. Step 3: Multiply each of those number of servings by 15 g:   2 servings of rice x 15 g = 30 g.   1 serving of corn x 15 g = 15 g.   1 serving of milk x 15 g = 15 g.   1 serving of strawberries x 15 g = 15 g. Step 4: Add  together all of the amounts to find the total grams of carbohydrates eaten: 30 g + 15 g + 15 g + 15 g = 75 g.   This information is not intended to replace advice given to you by your health care provider. Make sure you discuss any questions you have with your health care provider.   Document Released: 02/08/2005 Document Revised: 03/01/2014 Document Reviewed: 01/05/2013 Elsevier Interactive Patient Education 2016 Heath Maintenance, Female Adopting a healthy lifestyle and getting preventive care can go a long way to promote health and wellness. Talk with your health care provider about what schedule of regular examinations is right for you. This is a good chance for you to check in with your provider about disease prevention and staying healthy. In between checkups, there are plenty of things you can do on your own. Experts have done a lot of research about which lifestyle changes and preventive measures are most likely to keep you healthy. Ask your health care provider for more information. WEIGHT AND DIET  Eat a healthy diet  Be sure to include plenty of vegetables, fruits, low-fat dairy products, and lean protein.  Do not eat a lot of foods high in solid fats, added sugars, or salt.  Get regular exercise. This is one of the most important things you can do for your health.  Most adults should exercise for at least 150 minutes each week. The exercise should increase your heart rate and make you sweat (moderate-intensity exercise).  Most adults should also do strengthening exercises at least twice a week. This is in addition to the moderate-intensity exercise.  Maintain a healthy weight  Body mass index (BMI) is a measurement that can be used to identify possible weight problems. It estimates body fat based on height and weight. Your health care provider can help determine your BMI and help you achieve or maintain a healthy weight.  For females 87 years of age and older:    A BMI below 18.5 is considered underweight.  A BMI of 18.5 to 24.9 is normal.  A BMI of 25 to 29.9 is considered overweight.  A BMI of 30 and above is considered obese.  Watch levels of cholesterol and blood lipids  You should start having your blood tested for lipids and cholesterol at 46 years of age, then have this test every 5 years.  You may need to have your cholesterol levels checked more often if:  Your lipid or cholesterol levels are high.  You are older than 46 years of age.  You are at high risk for heart disease.  CANCER SCREENING   Lung Cancer  Lung cancer screening is recommended for adults 12-76 years old who are at high risk for lung cancer because of a history of  smoking.  A yearly low-dose CT scan of the lungs is recommended for people who:  Currently smoke.  Have quit within the past 15 years.  Have at least a 30-pack-year history of smoking. A pack year is smoking an average of one pack of cigarettes a day for 1 year.  Yearly screening should continue until it has been 15 years since you quit.  Yearly screening should stop if you develop a health problem that would prevent you from having lung cancer treatment.  Breast Cancer  Practice breast self-awareness. This means understanding how your breasts normally appear and feel.  It also means doing regular breast self-exams. Let your health care provider know about any changes, no matter how small.  If you are in your 20s or 30s, you should have a clinical breast exam (CBE) by a health care provider every 1-3 years as part of a regular health exam.  If you are 66 or older, have a CBE every year. Also consider having a breast X-ray (mammogram) every year.  If you have a family history of breast cancer, talk to your health care provider about genetic screening.  If you are at high risk for breast cancer, talk to your health care provider about having an MRI and a mammogram every year.  Breast  cancer gene (BRCA) assessment is recommended for women who have family members with BRCA-related cancers. BRCA-related cancers include:  Breast.  Ovarian.  Tubal.  Peritoneal cancers.  Results of the assessment will determine the need for genetic counseling and BRCA1 and BRCA2 testing. Cervical Cancer Your health care provider may recommend that you be screened regularly for cancer of the pelvic organs (ovaries, uterus, and vagina). This screening involves a pelvic examination, including checking for microscopic changes to the surface of your cervix (Pap test). You may be encouraged to have this screening done every 3 years, beginning at age 24.  For women ages 17-65, health care providers may recommend pelvic exams and Pap testing every 3 years, or they may recommend the Pap and pelvic exam, combined with testing for human papilloma virus (HPV), every 5 years. Some types of HPV increase your risk of cervical cancer. Testing for HPV may also be done on women of any age with unclear Pap test results.  Other health care providers may not recommend any screening for nonpregnant women who are considered low risk for pelvic cancer and who do not have symptoms. Ask your health care provider if a screening pelvic exam is right for you.  If you have had past treatment for cervical cancer or a condition that could lead to cancer, you need Pap tests and screening for cancer for at least 20 years after your treatment. If Pap tests have been discontinued, your risk factors (such as having a new sexual partner) need to be reassessed to determine if screening should resume. Some women have medical problems that increase the chance of getting cervical cancer. In these cases, your health care provider may recommend more frequent screening and Pap tests. Colorectal Cancer  This type of cancer can be detected and often prevented.  Routine colorectal cancer screening usually begins at 46 years of age and  continues through 46 years of age.  Your health care provider may recommend screening at an earlier age if you have risk factors for colon cancer.  Your health care provider may also recommend using home test kits to check for hidden blood in the stool.  A small camera at the end  of a tube can be used to examine your colon directly (sigmoidoscopy or colonoscopy). This is done to check for the earliest forms of colorectal cancer.  Routine screening usually begins at age 50.  Direct examination of the colon should be repeated every 5-10 years through 46 years of age. However, you may need to be screened more often if early forms of precancerous polyps or small growths are found. Skin Cancer  Check your skin from head to toe regularly.  Tell your health care provider about any new moles or changes in moles, especially if there is a change in a mole's shape or color.  Also tell your health care provider if you have a mole that is larger than the size of a pencil eraser.  Always use sunscreen. Apply sunscreen liberally and repeatedly throughout the day.  Protect yourself by wearing long sleeves, pants, a wide-brimmed hat, and sunglasses whenever you are outside. HEART DISEASE, DIABETES, AND HIGH BLOOD PRESSURE   High blood pressure causes heart disease and increases the risk of stroke. High blood pressure is more likely to develop in:  People who have blood pressure in the high end of the normal range (130-139/85-89 mm Hg).  People who are overweight or obese.  People who are African American.  If you are 18-39 years of age, have your blood pressure checked every 3-5 years. If you are 40 years of age or older, have your blood pressure checked every year. You should have your blood pressure measured twice--once when you are at a hospital or clinic, and once when you are not at a hospital or clinic. Record the average of the two measurements. To check your blood pressure when you are not at  a hospital or clinic, you can use:  An automated blood pressure machine at a pharmacy.  A home blood pressure monitor.  If you are between 55 years and 79 years old, ask your health care provider if you should take aspirin to prevent strokes.  Have regular diabetes screenings. This involves taking a blood sample to check your fasting blood sugar level.  If you are at a normal weight and have a low risk for diabetes, have this test once every three years after 45 years of age.  If you are overweight and have a high risk for diabetes, consider being tested at a younger age or more often. PREVENTING INFECTION  Hepatitis B  If you have a higher risk for hepatitis B, you should be screened for this virus. You are considered at high risk for hepatitis B if:  You were born in a country where hepatitis B is common. Ask your health care provider which countries are considered high risk.  Your parents were born in a high-risk country, and you have not been immunized against hepatitis B (hepatitis B vaccine).  You have HIV or AIDS.  You use needles to inject street drugs.  You live with someone who has hepatitis B.  You have had sex with someone who has hepatitis B.  You get hemodialysis treatment.  You take certain medicines for conditions, including cancer, organ transplantation, and autoimmune conditions. Hepatitis C  Blood testing is recommended for:  Everyone born from 1945 through 1965.  Anyone with known risk factors for hepatitis C. Sexually transmitted infections (STIs)  You should be screened for sexually transmitted infections (STIs) including gonorrhea and chlamydia if:  You are sexually active and are younger than 46 years of age.  You are older than 46   years of age and your health care provider tells you that you are at risk for this type of infection.  Your sexual activity has changed since you were last screened and you are at an increased risk for chlamydia or  gonorrhea. Ask your health care provider if you are at risk.  If you do not have HIV, but are at risk, it may be recommended that you take a prescription medicine daily to prevent HIV infection. This is called pre-exposure prophylaxis (PrEP). You are considered at risk if:  You are sexually active and do not regularly use condoms or know the HIV status of your partner(s).  You take drugs by injection.  You are sexually active with a partner who has HIV. Talk with your health care provider about whether you are at high risk of being infected with HIV. If you choose to begin PrEP, you should first be tested for HIV. You should then be tested every 3 months for as long as you are taking PrEP.  PREGNANCY   If you are premenopausal and you may become pregnant, ask your health care provider about preconception counseling.  If you may become pregnant, take 400 to 800 micrograms (mcg) of folic acid every day.  If you want to prevent pregnancy, talk to your health care provider about birth control (contraception). OSTEOPOROSIS AND MENOPAUSE   Osteoporosis is a disease in which the bones lose minerals and strength with aging. This can result in serious bone fractures. Your risk for osteoporosis can be identified using a bone density scan.  If you are 65 years of age or older, or if you are at risk for osteoporosis and fractures, ask your health care provider if you should be screened.  Ask your health care provider whether you should take a calcium or vitamin D supplement to lower your risk for osteoporosis.  Menopause may have certain physical symptoms and risks.  Hormone replacement therapy may reduce some of these symptoms and risks. Talk to your health care provider about whether hormone replacement therapy is right for you.  HOME CARE INSTRUCTIONS   Schedule regular health, dental, and eye exams.  Stay current with your immunizations.   Do not use any tobacco products including  cigarettes, chewing tobacco, or electronic cigarettes.  If you are pregnant, do not drink alcohol.  If you are breastfeeding, limit how much and how often you drink alcohol.  Limit alcohol intake to no more than 1 drink per day for nonpregnant women. One drink equals 12 ounces of beer, 5 ounces of wine, or 1 ounces of hard liquor.  Do not use street drugs.  Do not share needles.  Ask your health care provider for help if you need support or information about quitting drugs.  Tell your health care provider if you often feel depressed.  Tell your health care provider if you have ever been abused or do not feel safe at home.   This information is not intended to replace advice given to you by your health care provider. Make sure you discuss any questions you have with your health care provider.   Document Released: 08/24/2010 Document Revised: 03/01/2014 Document Reviewed: 01/10/2013 Elsevier Interactive Patient Education 2016 Elsevier Inc.  

## 2015-08-21 NOTE — Addendum Note (Signed)
Addended by: Burnett Kanaris on: 08/21/2015 11:27 AM   Modules accepted: Orders, SmartSet

## 2015-08-22 ENCOUNTER — Other Ambulatory Visit: Payer: Self-pay | Admitting: Women's Health

## 2015-08-22 LAB — URINALYSIS W MICROSCOPIC + REFLEX CULTURE
Bacteria, UA: NONE SEEN [HPF]
Bilirubin Urine: NEGATIVE
Casts: NONE SEEN [LPF]
Crystals: NONE SEEN [HPF]
Glucose, UA: NEGATIVE
Ketones, ur: NEGATIVE
Leukocytes, UA: NEGATIVE
Nitrite: NEGATIVE
Specific Gravity, Urine: 1.014 (ref 1.001–1.035)
Yeast: NONE SEEN [HPF]
pH: 6 (ref 5.0–8.0)

## 2015-08-22 LAB — VITAMIN D 25 HYDROXY (VIT D DEFICIENCY, FRACTURES): Vit D, 25-Hydroxy: 23 ng/mL — ABNORMAL LOW (ref 30–100)

## 2015-08-22 MED ORDER — VITAMIN D (ERGOCALCIFEROL) 1.25 MG (50000 UNIT) PO CAPS: 50000.0000 [IU] | ORAL_CAPSULE | ORAL | Status: DC

## 2015-08-23 LAB — URINE CULTURE: Colony Count: 8000

## 2015-08-25 LAB — PAP, TP IMAGING W/ HPV RNA, RFLX HPV TYPE 16,18/45: HPV mRNA, High Risk: NOT DETECTED

## 2015-09-04 ENCOUNTER — Other Ambulatory Visit: Payer: Self-pay | Admitting: Women's Health

## 2016-07-07 ENCOUNTER — Encounter: Payer: Self-pay | Admitting: Gynecology

## 2016-07-28 ENCOUNTER — Other Ambulatory Visit: Payer: Self-pay | Admitting: Women's Health

## 2016-07-28 DIAGNOSIS — Z1231 Encounter for screening mammogram for malignant neoplasm of breast: Secondary | ICD-10-CM

## 2016-08-12 ENCOUNTER — Ambulatory Visit: Payer: No Typology Code available for payment source

## 2016-08-24 ENCOUNTER — Ambulatory Visit (INDEPENDENT_AMBULATORY_CARE_PROVIDER_SITE_OTHER): Payer: BLUE CROSS/BLUE SHIELD | Admitting: Women's Health

## 2016-08-24 ENCOUNTER — Encounter: Payer: Self-pay | Admitting: Women's Health

## 2016-08-24 VITALS — BP 118/78 | Ht 66.0 in | Wt 225.0 lb

## 2016-08-24 DIAGNOSIS — Z13220 Encounter for screening for lipoid disorders: Secondary | ICD-10-CM | POA: Diagnosis not present

## 2016-08-24 DIAGNOSIS — N920 Excessive and frequent menstruation with regular cycle: Secondary | ICD-10-CM

## 2016-08-24 DIAGNOSIS — E559 Vitamin D deficiency, unspecified: Secondary | ICD-10-CM

## 2016-08-24 DIAGNOSIS — Z01419 Encounter for gynecological examination (general) (routine) without abnormal findings: Secondary | ICD-10-CM

## 2016-08-24 LAB — CBC WITH DIFFERENTIAL/PLATELET
Basophils Absolute: 0 cells/uL (ref 0–200)
Basophils Relative: 0 %
Eosinophils Absolute: 129 cells/uL (ref 15–500)
Eosinophils Relative: 3 %
HCT: 34.6 % — ABNORMAL LOW (ref 35.0–45.0)
Hemoglobin: 10.8 g/dL — ABNORMAL LOW (ref 11.7–15.5)
Lymphocytes Relative: 38 %
Lymphs Abs: 1634 cells/uL (ref 850–3900)
MCH: 27.1 pg (ref 27.0–33.0)
MCHC: 31.2 g/dL — ABNORMAL LOW (ref 32.0–36.0)
MCV: 86.9 fL (ref 80.0–100.0)
MPV: 9.5 fL (ref 7.5–12.5)
Monocytes Absolute: 344 cells/uL (ref 200–950)
Monocytes Relative: 8 %
Neutro Abs: 2193 cells/uL (ref 1500–7800)
Neutrophils Relative %: 51 %
Platelets: 301 10*3/uL (ref 140–400)
RBC: 3.98 MIL/uL (ref 3.80–5.10)
RDW: 14.1 % (ref 11.0–15.0)
WBC: 4.3 10*3/uL (ref 3.8–10.8)

## 2016-08-24 MED ORDER — IBUPROFEN 600 MG PO TABS: 600.0000 mg | ORAL_TABLET | Freq: Three times a day (TID) | ORAL | 1 refills | Status: DC | PRN
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
## 2016-08-24 NOTE — Patient Instructions (Signed)
Health Maintenance, Female Adopting a healthy lifestyle and getting preventive care can go a long way to promote health and wellness. Talk with your health care provider about what schedule of regular examinations is right for you. This is a good chance for you to check in with your provider about disease prevention and staying healthy. In between checkups, there are plenty of things you can do on your own. Experts have done a lot of research about which lifestyle changes and preventive measures are most likely to keep you healthy. Ask your health care provider for more information. Weight and diet Eat a healthy diet  Be sure to include plenty of vegetables, fruits, low-fat dairy products, and lean protein.  Do not eat a lot of foods high in solid fats, added sugars, or salt.  Get regular exercise. This is one of the most important things you can do for your health. ? Most adults should exercise for at least 150 minutes each week. The exercise should increase your heart rate and make you sweat (moderate-intensity exercise). ? Most adults should also do strengthening exercises at least twice a week. This is in addition to the moderate-intensity exercise.  Maintain a healthy weight  Body mass index (BMI) is a measurement that can be used to identify possible weight problems. It estimates body fat based on height and weight. Your health care provider can help determine your BMI and help you achieve or maintain a healthy weight.  For females 20 years of age and older: ? A BMI below 18.5 is considered underweight. ? A BMI of 18.5 to 24.9 is normal. ? A BMI of 25 to 29.9 is considered overweight. ? A BMI of 30 and above is considered obese.  Watch levels of cholesterol and blood lipids  You should start having your blood tested for lipids and cholesterol at 47 years of age, then have this test every 5 years.  You may need to have your cholesterol levels checked more often if: ? Your lipid or  cholesterol levels are high. ? You are older than 47 years of age. ? You are at high risk for heart disease.  Cancer screening Lung Cancer  Lung cancer screening is recommended for adults 55-80 years old who are at high risk for lung cancer because of a history of smoking.  A yearly low-dose CT scan of the lungs is recommended for people who: ? Currently smoke. ? Have quit within the past 15 years. ? Have at least a 30-pack-year history of smoking. A pack year is smoking an average of one pack of cigarettes a day for 1 year.  Yearly screening should continue until it has been 15 years since you quit.  Yearly screening should stop if you develop a health problem that would prevent you from having lung cancer treatment.  Breast Cancer  Practice breast self-awareness. This means understanding how your breasts normally appear and feel.  It also means doing regular breast self-exams. Let your health care provider know about any changes, no matter how small.  If you are in your 20s or 30s, you should have a clinical breast exam (CBE) by a health care provider every 1-3 years as part of a regular health exam.  If you are 40 or older, have a CBE every year. Also consider having a breast X-ray (mammogram) every year.  If you have a family history of breast cancer, talk to your health care provider about genetic screening.  If you are at high risk   for breast cancer, talk to your health care provider about having an MRI and a mammogram every year.  Breast cancer gene (BRCA) assessment is recommended for women who have family members with BRCA-related cancers. BRCA-related cancers include: ? Breast. ? Ovarian. ? Tubal. ? Peritoneal cancers.  Results of the assessment will determine the need for genetic counseling and BRCA1 and BRCA2 testing.  Cervical Cancer Your health care provider may recommend that you be screened regularly for cancer of the pelvic organs (ovaries, uterus, and  vagina). This screening involves a pelvic examination, including checking for microscopic changes to the surface of your cervix (Pap test). You may be encouraged to have this screening done every 3 years, beginning at age 22.  For women ages 56-65, health care providers may recommend pelvic exams and Pap testing every 3 years, or they may recommend the Pap and pelvic exam, combined with testing for human papilloma virus (HPV), every 5 years. Some types of HPV increase your risk of cervical cancer. Testing for HPV may also be done on women of any age with unclear Pap test results.  Other health care providers may not recommend any screening for nonpregnant women who are considered low risk for pelvic cancer and who do not have symptoms. Ask your health care provider if a screening pelvic exam is right for you.  If you have had past treatment for cervical cancer or a condition that could lead to cancer, you need Pap tests and screening for cancer for at least 20 years after your treatment. If Pap tests have been discontinued, your risk factors (such as having a new sexual partner) need to be reassessed to determine if screening should resume. Some women have medical problems that increase the chance of getting cervical cancer. In these cases, your health care provider may recommend more frequent screening and Pap tests.  Colorectal Cancer  This type of cancer can be detected and often prevented.  Routine colorectal cancer screening usually begins at 47 years of age and continues through 47 years of age.  Your health care provider may recommend screening at an earlier age if you have risk factors for colon cancer.  Your health care provider may also recommend using home test kits to check for hidden blood in the stool.  A small camera at the end of a tube can be used to examine your colon directly (sigmoidoscopy or colonoscopy). This is done to check for the earliest forms of colorectal  cancer.  Routine screening usually begins at age 33.  Direct examination of the colon should be repeated every 5-10 years through 47 years of age. However, you may need to be screened more often if early forms of precancerous polyps or small growths are found.  Skin Cancer  Check your skin from head to toe regularly.  Tell your health care provider about any new moles or changes in moles, especially if there is a change in a mole's shape or color.  Also tell your health care provider if you have a mole that is larger than the size of a pencil eraser.  Always use sunscreen. Apply sunscreen liberally and repeatedly throughout the day.  Protect yourself by wearing long sleeves, pants, a wide-brimmed hat, and sunglasses whenever you are outside.  Heart disease, diabetes, and high blood pressure  High blood pressure causes heart disease and increases the risk of stroke. High blood pressure is more likely to develop in: ? People who have blood pressure in the high end of  the normal range (130-139/85-89 mm Hg). ? People who are overweight or obese. ? People who are African American.  If you are 21-29 years of age, have your blood pressure checked every 3-5 years. If you are 3 years of age or older, have your blood pressure checked every year. You should have your blood pressure measured twice-once when you are at a hospital or clinic, and once when you are not at a hospital or clinic. Record the average of the two measurements. To check your blood pressure when you are not at a hospital or clinic, you can use: ? An automated blood pressure machine at a pharmacy. ? A home blood pressure monitor.  If you are between 17 years and 37 years old, ask your health care provider if you should take aspirin to prevent strokes.  Have regular diabetes screenings. This involves taking a blood sample to check your fasting blood sugar level. ? If you are at a normal weight and have a low risk for diabetes,  have this test once every three years after 47 years of age. ? If you are overweight and have a high risk for diabetes, consider being tested at a younger age or more often. Preventing infection Hepatitis B  If you have a higher risk for hepatitis B, you should be screened for this virus. You are considered at high risk for hepatitis B if: ? You were born in a country where hepatitis B is common. Ask your health care provider which countries are considered high risk. ? Your parents were born in a high-risk country, and you have not been immunized against hepatitis B (hepatitis B vaccine). ? You have HIV or AIDS. ? You use needles to inject street drugs. ? You live with someone who has hepatitis B. ? You have had sex with someone who has hepatitis B. ? You get hemodialysis treatment. ? You take certain medicines for conditions, including cancer, organ transplantation, and autoimmune conditions.  Hepatitis C  Blood testing is recommended for: ? Everyone born from [birth year] through 1966. ? Anyone with known risk factors for hepatitis C.  Sexually transmitted infections (STIs)  You should be screened for sexually transmitted infections (STIs) including gonorrhea and chlamydia if: ? You are sexually active and are younger than 47 years of age. ? You are older than 47 years of age and your health care provider tells you that you are at risk for this type of infection. ? Your sexual activity has changed since you were last screened and you are at an increased risk for chlamydia or gonorrhea. Ask your health care provider if you are at risk.  If you do not have HIV, but are at risk, it may be recommended that you take a prescription medicine daily to prevent HIV infection. This is called pre-exposure prophylaxis (PrEP). You are considered at risk if: ? You are sexually active and do not regularly use condoms or know the HIV status of your partner(s). ? You take drugs by injection. ? You are  sexually active with a partner who has HIV.  Talk with your health care provider about whether you are at high risk of being infected with HIV. If you choose to begin PrEP, you should first be tested for HIV. You should then be tested every 3 months for as long as you are taking PrEP. Pregnancy  If you are premenopausal and you may become pregnant, ask your health care provider about preconception counseling.  If you may become  pregnant, take 400 to 800 micrograms (mcg) of folic acid every day.  If you want to prevent pregnancy, talk to your health care provider about birth control (contraception). Osteoporosis and menopause  Osteoporosis is a disease in which the bones lose minerals and strength with aging. This can result in serious bone fractures. Your risk for osteoporosis can be identified using a bone density scan.  If you are 65 years of age or older, or if you are at risk for osteoporosis and fractures, ask your health care provider if you should be screened.  Ask your health care provider whether you should take a calcium or vitamin D supplement to lower your risk for osteoporosis.  Menopause may have certain physical symptoms and risks.  Hormone replacement therapy may reduce some of these symptoms and risks. Talk to your health care provider about whether hormone replacement therapy is right for you. Follow these instructions at home:  Schedule regular health, dental, and eye exams.  Stay current with your immunizations.  Do not use any tobacco products including cigarettes, chewing tobacco, or electronic cigarettes.  If you are pregnant, do not drink alcohol.  If you are breastfeeding, limit how much and how often you drink alcohol.  Limit alcohol intake to no more than 1 drink per day for nonpregnant women. One drink equals 12 ounces of beer, 5 ounces of wine, or 1 ounces of hard liquor.  Do not use street drugs.  Do not share needles.  Ask your health care  provider for help if you need support or information about quitting drugs.  Tell your health care provider if you often feel depressed.  Tell your health care provider if you have ever been abused or do not feel safe at home. This information is not intended to replace advice given to you by your health care provider. Make sure you discuss any questions you have with your health care provider. Document Released: 08/24/2010 Document Revised: 07/17/2015 Document Reviewed: 11/12/2014 Elsevier Interactive Patient Education  2018 [employer] Carbohydrate Counting for Diabetes Mellitus, Adult Carbohydrate counting is a method for keeping track of how many carbohydrates you eat. Eating carbohydrates naturally increases the amount of sugar (glucose) in the blood. Counting how many carbohydrates you eat helps keep your blood glucose within normal limits, which helps you manage your diabetes (diabetes mellitus). It is important to know how many carbohydrates you can safely have in each meal. This is different for every person. A diet and nutrition specialist (registered dietitian) can help you make a meal plan and calculate how many carbohydrates you should have at each meal and snack. Carbohydrates are found in the following foods:  Grains, such as breads and cereals.  Dried beans and soy products.  Starchy vegetables, such as potatoes, peas, and corn.  Fruit and fruit juices.  Milk and yogurt.  Sweets and snack foods, such as cake, cookies, candy, chips, and soft drinks.  How do I count carbohydrates? There are two ways to count carbohydrates in food. You can use either of the methods or a combination of both. Reading "Nutrition Facts" on packaged food The "Nutrition Facts" list is included on the labels of almost all packaged foods and beverages in the [place] It includes:  The serving size.  Information about nutrients in each serving, including the grams (g) of carbohydrate per  serving.  To use the "Nutrition Facts":  Decide how many servings you will have.  Multiply the number of servings by the number of carbohydrates   per serving.  The resulting number is the total amount of carbohydrates that you will be having.  Learning standard serving sizes of other foods When you eat foods containing carbohydrates that are not packaged or do not include "Nutrition Facts" on the label, you need to measure the servings in order to count the amount of carbohydrates:  Measure the foods that you will eat with a food scale or measuring cup, if needed.  Decide how many standard-size servings you will eat.  Multiply the number of servings by 15. Most carbohydrate-rich foods have about 15 g of carbohydrates per serving. ? For example, if you eat 8 oz (170 g) of strawberries, you will have eaten 2 servings and 30 g of carbohydrates (2 servings x 15 g = 30 g).  For foods that have more than one food mixed, such as soups and casseroles, you must count the carbohydrates in each food that is included.  The following list contains standard serving sizes of common carbohydrate-rich foods. Each of these servings has about 15 g of carbohydrates:   hamburger bun or  English muffin.   oz (15 mL) syrup.   oz (14 g) jelly.  1 slice of bread.  1 six-inch tortilla.  3 oz (85 g) cooked rice or pasta.  4 oz (113 g) cooked dried beans.  4 oz (113 g) starchy vegetable, such as peas, corn, or potatoes.  4 oz (113 g) hot cereal.  4 oz (113 g) mashed potatoes or  of a large baked potato.  4 oz (113 g) canned or frozen fruit.  4 oz (120 mL) fruit juice.  4-6 crackers.  6 chicken nuggets.  6 oz (170 g) unsweetened dry cereal.  6 oz (170 g) plain fat-free yogurt or yogurt sweetened with artificial sweeteners.  8 oz (240 mL) milk.  8 oz (170 g) fresh fruit or one small piece of fruit.  24 oz (680 g) popped popcorn.  Example of carbohydrate counting Sample meal  3  oz (85 g) chicken breast.  6 oz (170 g) brown rice.  4 oz (113 g) corn.  8 oz (240 mL) milk.  8 oz (170 g) strawberries with sugar-free whipped topping. Carbohydrate calculation 1. Identify the foods that contain carbohydrates: ? Rice. ? Corn. ? Milk. ? Strawberries. 2. Calculate how many servings you have of each food: ? 2 servings rice. ? 1 serving corn. ? 1 serving milk. ? 1 serving strawberries. 3. Multiply each number of servings by 15 g: ? 2 servings rice x 15 g = 30 g. ? 1 serving corn x 15 g = 15 g. ? 1 serving milk x 15 g = 15 g. ? 1 serving strawberries x 15 g = 15 g. 4. Add together all of the amounts to find the total grams of carbohydrates eaten: ? 30 g + 15 g + 15 g + 15 g = 75 g of carbohydrates total. This information is not intended to replace advice given to you by your health care provider. Make sure you discuss any questions you have with your health care provider. Document Released: 02/08/2005 Document Revised: 08/29/2015 Document Reviewed: 07/23/2015 Elsevier Interactive Patient Education  [employer].

## 2016-08-25 LAB — COMPREHENSIVE METABOLIC PANEL
ALT: 12 U/L (ref 6–29)
AST: 14 U/L (ref 10–35)
Albumin: 3.8 g/dL (ref 3.6–5.1)
Alkaline Phosphatase: 83 U/L (ref 33–115)
BUN: 8 mg/dL (ref 7–25)
CO2: 22 mmol/L (ref 20–31)
Calcium: 8.9 mg/dL (ref 8.6–10.2)
Chloride: 106 mmol/L (ref 98–110)
Creat: 0.89 mg/dL (ref 0.50–1.10)
Glucose, Bld: 111 mg/dL — ABNORMAL HIGH (ref 65–99)
Potassium: 3.7 mmol/L (ref 3.5–5.3)
Sodium: 139 mmol/L (ref 135–146)
Total Bilirubin: 0.3 mg/dL (ref 0.2–1.2)
Total Protein: 6.6 g/dL (ref 6.1–8.1)

## 2016-08-25 LAB — LIPID PANEL
Cholesterol: 137 mg/dL (ref <200)
HDL: 45 mg/dL — ABNORMAL LOW (ref >50)
LDL Cholesterol: 68 mg/dL (ref <100)
Total CHOL/HDL Ratio: 3 Ratio (ref <5.0)
Triglycerides: 118 mg/dL (ref <150)
VLDL: 24 mg/dL (ref <30)

## 2016-08-25 LAB — VITAMIN D 25 HYDROXY (VIT D DEFICIENCY, FRACTURES): Vit D, 25-Hydroxy: 20 ng/mL — ABNORMAL LOW (ref 30–100)

## 2016-08-26 ENCOUNTER — Encounter: Payer: Self-pay | Admitting: Women's Health

## 2016-08-26 ENCOUNTER — Ambulatory Visit
Admission: RE | Admit: 2016-08-26 | Discharge: 2016-08-26 | Disposition: A | Discharge status: Home / Self Care | Payer: No Typology Code available for payment source | Source: Ambulatory Visit | Attending: Women's Health | Admitting: Women's Health

## 2016-08-26 DIAGNOSIS — Z1231 Encounter for screening mammogram for malignant neoplasm of breast: Secondary | ICD-10-CM

## 2016-08-27 ENCOUNTER — Other Ambulatory Visit: Payer: Self-pay | Admitting: Women's Health

## 2016-08-27 MED ORDER — VITAMIN D (ERGOCALCIFEROL) 1.25 MG (50000 UNIT) PO CAPS: 50000.0000 [IU] | ORAL_CAPSULE | ORAL | 0 refills | Status: DC

## 2016-09-08 ENCOUNTER — Encounter (HOSPITAL_COMMUNITY): Payer: Self-pay | Admitting: Emergency Medicine

## 2016-09-08 ENCOUNTER — Ambulatory Visit (HOSPITAL_COMMUNITY)
Admission: EM | Admit: 2016-09-08 | Discharge: 2016-09-08 | Disposition: A | Discharge status: Home / Self Care | Payer: BLUE CROSS/BLUE SHIELD | Attending: Emergency Medicine | Admitting: Emergency Medicine

## 2016-09-08 DIAGNOSIS — S39012A Strain of muscle, fascia and tendon of lower back, initial encounter: Secondary | ICD-10-CM | POA: Diagnosis not present

## 2016-09-08 MED ORDER — DICLOFENAC SODIUM 75 MG PO TBEC: 75.0000 mg | DELAYED_RELEASE_TABLET | Freq: Two times a day (BID) | ORAL | 0 refills | Status: DC

## 2016-09-08 MED ORDER — CYCLOBENZAPRINE HCL 10 MG PO TABS: 10.0000 mg | ORAL_TABLET | Freq: Two times a day (BID) | ORAL | 0 refills | Status: DC | PRN

## 2016-09-08 NOTE — ED Triage Notes (Signed)
PT reports lower back pain that worsened yesterday. PT reports intermittent bilateral foot swelling that is not new. PT reports bottom of left foot is numb starting yesterday.

## 2016-09-08 NOTE — Discharge Instructions (Signed)
You most likely have a strained muscle in your lower back. I have prescribed two medicines for your pain. The first is diclofenac, take 1 tablet twice a day and the other is Flexeril, take 1 tablet twice a day. While taking the diclofenac do not take any other nonsteroidal such as ibuprofen, Advil, Motrin, naproxen, Naprosyn, etc. Flexeril may cause drowsiness so do not drive until you know how this medicine affects you. Also do not drink any alcohol either. You may apply ice and alternate with heat for 15 minutes at a time 4 times daily and for additional pain control you may take tylenol over the counter ever 4 hours but do not take more than 4000 mg a day. Should your pain continue or fail to resolve, follow up with your primary care provider or return to clinic as needed.
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
## 2016-11-20 ENCOUNTER — Emergency Department (HOSPITAL_COMMUNITY): Payer: BLUE CROSS/BLUE SHIELD

## 2016-11-20 ENCOUNTER — Ambulatory Visit (INDEPENDENT_AMBULATORY_CARE_PROVIDER_SITE_OTHER)
Admission: EM | Admit: 2016-11-20 | Discharge: 2016-11-20 | Disposition: A | Discharge status: Home / Self Care | Payer: BLUE CROSS/BLUE SHIELD | Source: Home / Self Care

## 2016-11-20 ENCOUNTER — Inpatient Hospital Stay (HOSPITAL_COMMUNITY)
Admission: EM | Admit: 2016-11-20 | Discharge: 2016-11-22 | DRG: 176 | Disposition: A | Discharge status: Home / Self Care | Payer: BLUE CROSS/BLUE SHIELD | Attending: Internal Medicine | Admitting: Internal Medicine

## 2016-11-20 ENCOUNTER — Encounter (HOSPITAL_COMMUNITY): Payer: Self-pay | Admitting: Emergency Medicine

## 2016-11-20 ENCOUNTER — Encounter (HOSPITAL_COMMUNITY): Payer: Self-pay

## 2016-11-20 DIAGNOSIS — R0609 Other forms of dyspnea: Secondary | ICD-10-CM | POA: Diagnosis not present

## 2016-11-20 DIAGNOSIS — I2602 Saddle embolus of pulmonary artery with acute cor pulmonale: Secondary | ICD-10-CM

## 2016-11-20 DIAGNOSIS — I2699 Other pulmonary embolism without acute cor pulmonale: Secondary | ICD-10-CM | POA: Diagnosis not present

## 2016-11-20 DIAGNOSIS — Z791 Long term (current) use of non-steroidal anti-inflammatories (NSAID): Secondary | ICD-10-CM | POA: Diagnosis not present

## 2016-11-20 DIAGNOSIS — Z885 Allergy status to narcotic agent status: Secondary | ICD-10-CM

## 2016-11-20 DIAGNOSIS — R0789 Other chest pain: Secondary | ICD-10-CM

## 2016-11-20 DIAGNOSIS — Z86711 Personal history of pulmonary embolism: Secondary | ICD-10-CM | POA: Diagnosis not present

## 2016-11-20 DIAGNOSIS — R0600 Dyspnea, unspecified: Secondary | ICD-10-CM

## 2016-11-20 DIAGNOSIS — R079 Chest pain, unspecified: Secondary | ICD-10-CM | POA: Diagnosis not present

## 2016-11-20 DIAGNOSIS — D649 Anemia, unspecified: Secondary | ICD-10-CM | POA: Diagnosis not present

## 2016-11-20 DIAGNOSIS — I361 Nonrheumatic tricuspid (valve) insufficiency: Secondary | ICD-10-CM | POA: Diagnosis not present

## 2016-11-20 LAB — BASIC METABOLIC PANEL
Anion gap: 10 (ref 5–15)
BUN: 9 mg/dL (ref 6–20)
CO2: 20 mmol/L — ABNORMAL LOW (ref 22–32)
Calcium: 9.1 mg/dL (ref 8.9–10.3)
Chloride: 107 mmol/L (ref 101–111)
Creatinine, Ser: 1.05 mg/dL — ABNORMAL HIGH (ref 0.44–1.00)
GFR calc Af Amer: >60 mL/min (ref >60)
GFR calc non Af Amer: >60 mL/min (ref >60)
Glucose, Bld: 140 mg/dL — ABNORMAL HIGH (ref 65–99)
Potassium: 3.8 mmol/L (ref 3.5–5.1)
Sodium: 137 mmol/L (ref 135–145)

## 2016-11-20 LAB — I-STAT TROPONIN, ED
Troponin i, poc: 0.08 ng/mL (ref 0.00–0.08)
Troponin i, poc: 0.02 ng/mL (ref 0.00–0.08)

## 2016-11-20 LAB — CBC
HCT: 35.3 % — ABNORMAL LOW (ref 36.0–46.0)
Hemoglobin: 11.1 g/dL — ABNORMAL LOW (ref 12.0–15.0)
MCH: 28 pg (ref 26.0–34.0)
MCHC: 31.4 g/dL (ref 30.0–36.0)
MCV: 88.9 fL (ref 78.0–100.0)
Platelets: 170 10*3/uL (ref 150–400)
RBC: 3.97 MIL/uL (ref 3.87–5.11)
RDW: 13.7 % (ref 11.5–15.5)
WBC: 5.8 10*3/uL (ref 4.0–10.5)

## 2016-11-20 LAB — I-STAT BETA HCG BLOOD, ED (MC, WL, AP ONLY): I-stat hCG, quantitative: <5 m[IU]/mL (ref <5)

## 2016-11-20 MED ORDER — IOPAMIDOL (ISOVUE-370) INJECTION 76%
INTRAVENOUS | Status: AC
  Administered 2016-11-20: 100 mL via INTRAVENOUS
  Filled 2016-11-20: qty 100

## 2016-11-20 MED ORDER — HEPARIN BOLUS VIA INFUSION
4000.0000 [IU] | Freq: Once | INTRAVENOUS | Status: AC
  Administered 2016-11-20: 4000 [IU] via INTRAVENOUS
  Filled 2016-11-20: qty 4000

## 2016-11-20 MED ORDER — HEPARIN (PORCINE) IN NACL 100-0.45 UNIT/ML-% IJ SOLN
800.0000 [IU]/h | INTRAMUSCULAR | Status: DC
  Administered 2016-11-20: 1350 [IU]/h via INTRAVENOUS
  Filled 2016-11-20 (×3): qty 250

## 2016-11-20 NOTE — ED Notes (Signed)
IV access attempted X2 unsuccessful

## 2016-11-20 NOTE — Discharge Instructions (Signed)
Go directly to the emergency department now for evaluation of chest pain and shortness of breath. And concerned about the possibility of having another blood clot in your lung. Also consideration of possible heart problem. Do not wait, go now.

## 2016-11-20 NOTE — Progress Notes (Signed)
eLink Physician-Brief Progress Note Patient Name: SHANOAH ASBILL DOB: 05/22/1970 MRN: [id number]   Date of Service  11/20/2016  HPI/Events of Note  New pulmonary consult by emergency department. Patient was CT angiogram of the chest showing bilateral pulmonary emboli with filling defects in the distal main pulmonary arteries extending into all lobar and many segmental branches. Reportedly this was progressive dyspnea. Questionable RV strain with RV/LV ratio 1.19. Patient currently not tachycardic. Not requiring any oxygen therapy. Reportedly patient was non-syncopal.   eICU Interventions  1. Plan for admission by hospitalist service 2. Recommended echocardiogram to evaluate RV function 3. Recommended systemic anticoagulation with heparin infusion barring contraindications 4. PCCM to formally consult in AM     Intervention Category Evaluation Type: New Patient Evaluation  Tera Partridge 11/20/2016, 11:46 PM

## 2016-11-20 NOTE — ED Triage Notes (Signed)
Pt. Stated, come here from UC for further evaluation. Im having chest pain, and SOB with exertion. 3 years ago I had a blood clot and the symptoms were the same.

## 2016-11-20 NOTE — ED Notes (Signed)
Pt returned from CT °

## 2016-11-20 NOTE — ED Triage Notes (Addendum)
Patient presents to [facility] with complaints of SOB associated with chest pain x3 days, patient states she waited to come in today because she was getting out of breath more frequently, pt has hx of blood clots

## 2016-11-20 NOTE — Progress Notes (Signed)
ANTICOAGULATION CONSULT NOTE - Initial Consult  Pharmacy Consult for Heparin Indication: pulmonary embolus  Allergies  Allergen Reactions  . Codeine Nausea Only    Patient Measurements: Height: 5\' 5"  (165.1 cm) Weight: 222 lb (100.7 kg) IBW/kg (Calculated) : 57 Heparin Dosing Weight:  80 kg  Vital Signs: Temp: 98.5 F (36.9 C) (09/29 1658) Temp Source: Oral (09/29 1658) BP: 146/87 (09/29 2245) Pulse Rate: 118 (09/29 2245)  Labs:  Recent Labs  11/20/16 1704  HGB 11.1*  HCT 35.3*  PLT 170  CREATININE 1.05*    Estimated Creatinine Clearance: 77.9 mL/min (A) (by C-G formula based on SCr of 1.05 mg/dL (H)).   Medical History: Past Medical History:  Diagnosis Date  . Pulmonary embolism ([facility]) March 13, 2013    Medications:  No current facility-administered medications on file prior to encounter.    Current Outpatient Prescriptions on File Prior to Encounter  Medication Sig Dispense Refill  . cyclobenzaprine (FLEXERIL) 10 MG tablet Take 1 tablet (10 mg total) by mouth 2 (two) times daily as needed for muscle spasms. 20 tablet 0     Assessment: 46 y.o. female with PE for heparin Goal of Therapy:  Heparin level 0.3-0.7 units/ml Monitor platelets by anticoagulation protocol: Yes   Plan:  Heparin 4000 units IV bolus, then start heparin 1350 units/hr Check heparin level in 6 hours.   Caryl Pina 11/20/2016,11:12 PM
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
## 2016-11-20 NOTE — ED Notes (Signed)
IV team at the bedside with ultrasound
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
## 2016-11-21 ENCOUNTER — Encounter (HOSPITAL_COMMUNITY): Payer: Self-pay | Admitting: Internal Medicine

## 2016-11-21 DIAGNOSIS — I2699 Other pulmonary embolism without acute cor pulmonale: Secondary | ICD-10-CM

## 2016-11-21 LAB — CBC
HCT: 34.3 % — ABNORMAL LOW (ref 36.0–46.0)
HCT: 34.4 % — ABNORMAL LOW (ref 36.0–46.0)
Hemoglobin: 10.6 g/dL — ABNORMAL LOW (ref 12.0–15.0)
Hemoglobin: 10.7 g/dL — ABNORMAL LOW (ref 12.0–15.0)
MCH: 27.2 pg (ref 26.0–34.0)
MCH: 27.4 pg (ref 26.0–34.0)
MCHC: 30.9 g/dL (ref 30.0–36.0)
MCHC: 31.1 g/dL (ref 30.0–36.0)
MCV: 87.9 fL (ref 78.0–100.0)
MCV: 88 fL (ref 78.0–100.0)
Platelets: 207 10*3/uL (ref 150–400)
Platelets: 212 10*3/uL (ref 150–400)
RBC: 3.9 MIL/uL (ref 3.87–5.11)
RBC: 3.91 MIL/uL (ref 3.87–5.11)
RDW: 13.8 % (ref 11.5–15.5)
RDW: 13.9 % (ref 11.5–15.5)
WBC: 6.8 10*3/uL (ref 4.0–10.5)
WBC: 5.8 10*3/uL (ref 4.0–10.5)

## 2016-11-21 LAB — COMPREHENSIVE METABOLIC PANEL
ALT: 19 U/L (ref 14–54)
AST: 19 U/L (ref 15–41)
Albumin: 3.6 g/dL (ref 3.5–5.0)
Alkaline Phosphatase: 79 U/L (ref 38–126)
Anion gap: 10 (ref 5–15)
BUN: 8 mg/dL (ref 6–20)
CO2: 21 mmol/L — ABNORMAL LOW (ref 22–32)
Calcium: 8.9 mg/dL (ref 8.9–10.3)
Chloride: 109 mmol/L (ref 101–111)
Creatinine, Ser: 0.95 mg/dL (ref 0.44–1.00)
GFR calc Af Amer: >60 mL/min (ref >60)
GFR calc non Af Amer: >60 mL/min (ref >60)
Glucose, Bld: 119 mg/dL — ABNORMAL HIGH (ref 65–99)
Potassium: 3.8 mmol/L (ref 3.5–5.1)
Sodium: 140 mmol/L (ref 135–145)
Total Bilirubin: 0.6 mg/dL (ref 0.3–1.2)
Total Protein: 6.6 g/dL (ref 6.5–8.1)

## 2016-11-21 LAB — HEPARIN LEVEL (UNFRACTIONATED)
Heparin Unfractionated: 1.38 IU/mL — ABNORMAL HIGH (ref 0.30–0.70)
Heparin Unfractionated: 1.1 IU/mL — ABNORMAL HIGH (ref 0.30–0.70)
Heparin Unfractionated: 0.65 IU/mL (ref 0.30–0.70)

## 2016-11-21 LAB — TROPONIN I
Troponin I: <0.03 ng/mL (ref <0.03)
Troponin I: <0.03 ng/mL (ref <0.03)
Troponin I: <0.03 ng/mL (ref <0.03)

## 2016-11-21 LAB — HIV ANTIBODY (ROUTINE TESTING W REFLEX): HIV Screen 4th Generation wRfx: NONREACTIVE

## 2016-11-21 MED ORDER — SODIUM CHLORIDE 0.9 % IV SOLN: 250.0000 mL | INTRAVENOUS | Status: DC | PRN

## 2016-11-21 MED ORDER — HEPARIN (PORCINE) IN NACL 100-0.45 UNIT/ML-% IJ SOLN
750.0000 [IU]/h | INTRAMUSCULAR | Status: DC
  Administered 2016-11-21 (×2): 750 [IU]/h via INTRAVENOUS
  Filled 2016-11-21 (×2): qty 250

## 2016-11-21 MED ORDER — INFLUENZA VAC SPLIT QUAD 0.5 ML IM SUSY: 0.5000 mL | PREFILLED_SYRINGE | INTRAMUSCULAR | Status: DC

## 2016-11-21 MED ORDER — VITAMIN D 1000 UNITS PO TABS
2000.0000 [IU] | ORAL_TABLET | Freq: Every day | ORAL | Status: DC
  Administered 2016-11-21 – 2016-11-22 (×2): 2000 [IU] via ORAL
  Filled 2016-11-21 (×2): qty 2

## 2016-11-21 MED ORDER — ADULT MULTIVITAMIN W/MINERALS CH
1.0000 | ORAL_TABLET | Freq: Every day | ORAL | Status: DC
  Administered 2016-11-21 – 2016-11-22 (×2): 1 via ORAL
  Filled 2016-11-21 (×2): qty 1

## 2016-11-21 MED ORDER — SODIUM CHLORIDE 0.9% FLUSH: 3.0000 mL | INTRAVENOUS | Status: DC | PRN

## 2016-11-21 MED ORDER — ACETAMINOPHEN 325 MG PO TABS: 650.0000 mg | ORAL_TABLET | Freq: Four times a day (QID) | ORAL | Status: DC | PRN

## 2016-11-21 MED ORDER — CYCLOBENZAPRINE HCL 10 MG PO TABS: 10.0000 mg | ORAL_TABLET | Freq: Two times a day (BID) | ORAL | Status: DC | PRN

## 2016-11-21 MED ORDER — SODIUM CHLORIDE 0.9% FLUSH
3.0000 mL | Freq: Two times a day (BID) | INTRAVENOUS | Status: DC
  Administered 2016-11-21 (×3): 3 mL via INTRAVENOUS

## 2016-11-21 MED ORDER — ACETAMINOPHEN 650 MG RE SUPP: 650.0000 mg | Freq: Four times a day (QID) | RECTAL | Status: DC | PRN

## 2016-11-21 NOTE — ED Notes (Signed)
Attempted report x1. 

## 2016-11-21 NOTE — ED Notes (Signed)
Per pharmacy will hold heparin for 30 mins then restarted at rate determined by pharmacy

## 2016-11-21 NOTE — Progress Notes (Signed)
Marissa [name]  is a 46 y.o. female, w hx of PE w negative hypercoag panel, c/o  Dyspnea since Tuesday and it was not getting better  And also  had chest discomfort, substernal pressure and therefore presented to ED. She was found to have sub massive PE.  Started on IV heparin and admitted .    Hosie Poisson, MD [phone]

## 2016-11-21 NOTE — Progress Notes (Signed)
ANTICOAGULATION CONSULT NOTE - Follow Up Consult  Pharmacy Consult for Heparin Indication: pulmonary embolus  Allergies  Allergen Reactions  . Codeine Nausea Only    Patient Measurements: Height: 5\' 5"  (165.1 cm) Weight: 222 lb (100.7 kg) IBW/kg (Calculated) : 57 Heparin Dosing Weight: 80 kg  Vital Signs: BP: 119/67 (09/30 0600) Pulse Rate: 94 (09/30 0600)  Labs:  Recent Labs  11/20/16 1704 11/21/16 0021 11/21/16 0614  HGB 11.1*  --  10.6*  HCT 35.3*  --  34.3*  PLT 170  --  207  HEPARINUNFRC  --   --  1.38*  CREATININE 1.05*  --  0.95  TROPONINI  --  <0.03 <0.03    Estimated Creatinine Clearance: 86.1 mL/min (by C-G formula based on SCr of 0.95 mg/dL).   Medications:  Infusions:  . sodium chloride    . heparin 1,350 Units/hr (11/20/16 2343)    Assessment: 46 year old female on IV Heparin for acute bilateral pulmonary embolus with some right heart strain (RV/LV 1.19).  Note patient has history of IUD-induced pulmonary embolus and was on Xarelto back in 2015 for 6 months but has not been on anticoagulation since that time.  Initial heparin level is elevated at 1.38 - elevated for goal (6hr level). Some bolus effect likely. Confirmed with Baldo Ash, RN - drawn from opposite hand. No bleeding noted.   Goal of Therapy:  Heparin level 0.3-0.7 units/ml Monitor platelets by anticoagulation protocol: Yes   Plan:  Decrease heparin to 1050 units/hr. Recheck in 6 hours.  Daily heparin level and CBC.  Monitor for any bleeding.  Sloan Leiter, PharmD, BCPS Clinical Pharmacist Clinical phone 11/21/2016 until 3:[phone] After hours, please call [phone] 11/21/2016,8:03 AM

## 2016-11-21 NOTE — ED Notes (Signed)
Pt assisted to bedside commode by this RN. Pt refusing to use female external catheter or bedpan. Pt educated on risks of excessive activity with current condition, pt verbalized understanding.

## 2016-11-21 NOTE — ED Notes (Signed)
Pt voided X1

## 2016-11-21 NOTE — ED Notes (Signed)
Pt updated to bed assignment, family at bedside.

## 2016-11-21 NOTE — Progress Notes (Signed)
ANTICOAGULATION CONSULT NOTE - Follow Up Consult  Pharmacy Consult for Heparin Indication: pulmonary embolus  Allergies  Allergen Reactions  . Codeine Nausea Only    Patient Measurements: Height: 5\' 5"  (165.1 cm) Weight: 222 lb (100.7 kg) IBW/kg (Calculated) : 57 Heparin Dosing Weight: 80 kg  Vital Signs: BP: 123/80 (09/30 1530) Pulse Rate: 93 (09/30 1530)  Labs:  Recent Labs  11/20/16 1704 11/21/16 0021 11/21/16 0614 11/21/16 1128 11/21/16 1341  HGB 11.1*  --  10.6*  --   --   HCT 35.3*  --  34.3*  --   --   PLT 170  --  207  --   --   HEPARINUNFRC  --   --  1.38*  --  1.10*  CREATININE 1.05*  --  0.95  --   --   TROPONINI  --  <0.03 <0.03 <0.03  --     Estimated Creatinine Clearance: 86.1 mL/min (by C-G formula based on SCr of 0.95 mg/dL).   Medications:  Infusions:  . sodium chloride    . heparin 1,050 Units/hr (11/21/16 0825)    Assessment: 46 year old female on IV Heparin for acute bilateral pulmonary embolus with some right heart strain (RV/LV 1.19).  Note patient has history of IUD-induced pulmonary embolus and was on Xarelto back in 2015 for 6 months but has not been on anticoagulation since that time.  Initial heparin level is elevated at 1.38 and rate was reduced by ~3 units/kg/hr. Recheck heparin level remains elevated but trending down at 1.1 on this rate.  Will hold heparin for 30 minutes then resume at lower rate. No bleeding noted per RN. Will recheck heparin level with CBC in 6 hours.   Goal of Therapy:  Heparin level 0.3-0.7 units/ml Monitor platelets by anticoagulation protocol: Yes   Plan:  Hold heparin x 30 minutes. Restart after 30 minutes at lower rate of 750 units/hr. Recheck HL and CBC in 6 hours.  Daily heparin level and CBC.  Monitor for any bleeding.  Sloan Leiter, PharmD, BCPS Clinical Pharmacist Clinical phone 11/21/2016 until 3:[phone] After hours, please call [phone] 11/21/2016,3:36 PM

## 2016-11-21 NOTE — Consult Note (Signed)
Name: Marissa [name] MRN: [id number] DOB: 1970/03/19    ADMISSION DATE:  11/20/2016 CONSULTATION DATE:  9/30  REFERRING MD :  [facility] , Dr. Maudie Mercury   CHIEF COMPLAINT:  PE   BRIEF PATIENT DESCRIPTION: 46 yo female with hx of PE in past admitted 11/21/16 with Acute bilateral PE with right heart strain on CT chest . Pulmonary consulted to evaluate for need of possible EKOS.   SIGNIFICANT EVENTS  Admitted 9/29 with acute PE   STUDIES:  CT chest 9/29 acute PE bilateral with RHS 1.2 ratio.    HISTORY OF PRESENT ILLNESS:   Pt is a female that has a hx of PE in 2015 with reported neg hypercoagulable panel . She was treated . She developed sob 5 days prior to admission that persisted and day of admission chest pain began . [name] Kitchen In ER was found to have bilateral acute PE on CT chest .  There was evidence of right heart strain on CT chest with 1.2 ratio. There is moderate clot burden . 2 D echo is pending . Her VS have been stable with no hypotension or tachycardia.  o2 sats have been normal on room air. Labs showed neg troponin . Mild anemia with hbg at 11.1 . Normal platelets. Neg preg test. Kidney fxn is normal. She was started on Heparin drip .  Pulmonary was consulted to evaluate for need of EKOS -targeted catheter lysis .   She is a never smoker. Single with no kids. Not on BCP. No recent long travel or known injury. No recent surgery . No FH of VTE. No known autoimmune disorders. She works a Designer, multimedia.  Has noticed some left leg pain and ankle swelling for last few months . No hemoptysis .   Pt had bilateral PE with left DVT in 02/2013 while on nuvaring and left leg injury . Felt to be a provoked VTE . Plan was to treat with 6 months of Xarelto . During this admission she had no RHS on Echo . She was seen in Pulmonary clinic and recommended for follow up but did not come back . She says she did finish 6 month of therapy. Hypercoagulable panel was neg.   She is eating and sitting up in bed . No  dyspnea at rest. Mild doe with walking to bathroom. O2 sats are 99% on room air. B/p and HR are normal.     PAST MEDICAL HISTORY :   has a past medical history of Pulmonary embolism ([place]) (March 13, 2013).  has a past surgical history that includes Abdominal surgery (1990); Laparoscopic cholecystectomy (03-28-2008); and Dilation and evacuation (N/A, 11/28/2012). Prior to Admission medications   Medication Sig Start Date End Date Taking? Authorizing Provider  cholecalciferol (VITAMIN D) 1000 units tablet Take 2,000 Units by mouth daily.   Yes [provider]  cyclobenzaprine (FLEXERIL) 10 MG tablet Take 1 tablet (10 mg total) by mouth 2 (two) times daily as needed for muscle spasms. 09/08/16  Yes Barnet Glasgow, NP  ibuprofen (ADVIL,MOTRIN) 600 MG tablet Take 600 mg by mouth every 6 (six) hours as needed for moderate pain.   Yes [provider]  Multiple Vitamin (MULTIVITAMIN WITH MINERALS) TABS tablet Take 1 tablet by mouth daily.   Yes [provider]   Allergies  Allergen Reactions  . Codeine Nausea Only    FAMILY HISTORY:  family history includes Aneurysm in her mother; Diabetes in her father; Hypertension in her brother and mother; Lung cancer in  her maternal grandfather; Stomach cancer in her maternal grandmother. SOCIAL HISTORY:  reports that she has never smoked. She has never used smokeless tobacco. She reports that she does not drink alcohol or use drugs.  REVIEW OF SYSTEMS:   Constitutional: Negative for fever, chills, weight loss, malaise/fatigue and diaphoresis.  HENT: Negative for hearing loss, ear pain, nosebleeds, congestion, sore throat, neck pain, tinnitus and ear discharge.   Eyes: Negative for blurred vision, double vision, photophobia, pain, discharge and redness.  Respiratory: Negative for cough, hemoptysis, sputum production, ++shortness of breath, no wheezing and stridor.   Cardiovascular: Negative for  palpitations, orthopnea,  claudication, and PND.  +chest pressure left leg swelling   Gastrointestinal: Negative for heartburn, nausea, vomiting, abdominal pain, diarrhea, constipation, blood in stool and melena.  Genitourinary: Negative for dysuria, urgency, frequency, hematuria and flank pain.  Musculoskeletal: Negative for myalgias, back pain, joint pain and falls.  Skin: Negative for itching and rash.  Neurological: Negative for dizziness, tingling, tremors, sensory change, speech change, focal weakness, seizures, loss of consciousness, weakness and headaches.  Endo/Heme/Allergies: Negative for environmental allergies and polydipsia. Does not bruise/bleed easily.  SUBJECTIVE:  SOB , +PE , left ankle swelling   VITAL SIGNS: Temp:  [98.2 F (36.8 C)-98.5 F (36.9 C)] 98.5 F (36.9 C) (09/29 1658) Pulse Rate:  [83-118] 98 (09/30 1300) Resp:  [14-28] 18 (09/30 1300) BP: (105-174)/(66-104) 134/83 (09/30 1300) SpO2:  [89 %-100 %] 98 % (09/30 1300) Weight:  [222 lb (100.7 kg)] 222 lb (100.7 kg) (09/29 1700)  PHYSICAL EXAMINATION:  GEN: A/Ox3; pleasant , NAD, obese    HEENT:  [place]/AT,   NOSE-clear, THROAT-clear, no lesions, no postnasal drip or exudate noted.   NECK:  Supple w/ fair ROM; no JVD; normal carotid impulses w/o bruits; no thyromegaly or nodules palpated; no lymphadenopathy.    RESP  Clear  P & A; w/o, wheezes/ rales/ or rhonchi. no accessory muscle use, no dullness to percussion  CARD:  RRR, no m/r/g  , no peripheral edema, pulses intact, no cyanosis or clubbing. Neg calf tenderness, neg homans sign   GI:   Soft & nt; nml bowel sounds; no organomegaly or masses detected.   Musco: Warm bil, no deformities or joint swelling noted.   Neuro: alert, no focal deficits noted.    Skin: Warm, no lesions or rashes    Recent Labs Lab 11/20/16 1704 11/21/16 0614  NA 137 140  K 3.8 3.8  CL 107 109  CO2 20* 21*  BUN 9 8  CREATININE 1.05* 0.95  GLUCOSE 140* 119*    Recent Labs Lab  11/20/16 1704 11/21/16 0614  HGB 11.1* 10.6*  HCT 35.3* 34.3*  WBC 5.8 6.8  PLT 170 207   Dg Chest 2 View  Result Date: 11/20/2016 CLINICAL DATA:  Dyspnea on exertion x3 days. Central chest pain x1 day. EXAM: CHEST  2 VIEW COMPARISON:  03/29/2013 FINDINGS: The heart size and mediastinal contours are within normal limits. No pneumonic consolidation, effusion or pneumothorax. The visualized skeletal structures are unremarkable. IMPRESSION: No active cardiopulmonary disease. Electronically Signed   By: Ashley Royalty M.D.   On: 11/20/2016 17:53   Ct Angio Chest Pe W And/or Wo Contrast  Result Date: 11/20/2016 CLINICAL DATA:  PE suspected, high pretest prob.  Dyspnea. EXAM: CT ANGIOGRAPHY CHEST WITH CONTRAST TECHNIQUE: Multidetector CT imaging of the chest was performed using the standard protocol during bolus administration of intravenous contrast. Multiplanar CT image reconstructions and MIPs were obtained to evaluate the  vascular anatomy. CONTRAST:  70 cc Isovue 370 IV COMPARISON:  Radiograph earlier this day FINDINGS: Cardiovascular: Examination is positive for bilateral pulmonary emboli with filling defects in the distal main pulmonary arteries extending into all lobar and many segmental branches. Thromboembolic burden is moderate. There is straightening of the intraventricular septum with elevated RV to LV ratio of 1.19. Normal caliber thoracic aorta without dissection. The heart is normal in size. Mediastinum/Nodes: No mediastinal or hilar adenopathy. The esophagus is decompressed. Visualized thyroid gland is normal. Lungs/Pleura: No evidence of pulmonary infarct. Mild atelectasis in the anterior right middle lobe. Lungs are otherwise clear. No pleural fluid or pulmonary edema. Upper Abdomen: Surgical clips in the upper abdomen. No acute abnormality. Musculoskeletal: There are no acute or suspicious osseous abnormalities. Review of the MIP images confirms the above findings. IMPRESSION: Positive for  acute PE with filling defects and bilateral main pulmonary arteries and CT evidence of right heart strain (RV/LV Ratio = 1.2) consistent with at least submassive (intermediate risk) PE. The presence of right heart strain has been associated with an increased risk of morbidity and mortality. Please activate Code PE by paging [phone]. Critical Value/emergent results were called by telephone at the time of interpretation on 11/20/2016 at 11:03 pm to Dr. Addison Lank , who verbally acknowledged these results. Electronically Signed   By: Jeb Levering M.D.   On: 11/20/2016 23:04    ASSESSMENT / PLAN: 1. Bilateral PE -Recurrent  Pt has moderate clot burden on CT chest . Right heart strain on CT chest with 1.2 ratio.  She is hemodynamically stable with normal blood pressure, HR and good oxygenation on room air. Currently would not recommended targeted catheter  guided thrombolysis (EKOS) .  Would check 2 D echo for right heart strain and if present will need to re-evaluate.   Plan  Check 2 D echo -look for evidence of right heart strain . If present re-evaluate for EKOS  Monitor VS and re-evaluate sooner if instability  Cont Hep Drip.  Check venous dopplers  Most likely will need lifelong anticoagulation . Could consider hematology referral in OP setting considering age.   Rexene Edison NP -C  Pulmonary and [place] Pager: [phone] 11/21/2016, 1:12 PM   Attending Note:  I have examined patient, reviewed labs, studies and notes. I have discussed the case with T Parrett, and I agree with the data and plans as amended above. Patient is 23 with a history of prior pulmonary embolism. She reports that she completed 6 months of anticoagulation therapy at that time. Presents now with recurrent bilateral PE, non-provoked. Moderate clot burden on CT scan of the chest with an RV to LV ratio 1.2. On my evaluation she is comfortable, hemodynamically stable and on  room air. Lungs are clear bilaterally. I do not feel any cords are clots in the lower extremities. I do not believe she will be a candidate for targeted lysis but would like to check an echocardiogram for completeness to ensure no significant RV strain. If present we could reconsider. She will likely need lifelong anticoagulation. I explained this to her today. We will follow with you.     Baltazar Apo, MD, PhD 11/21/2016, 3:29 PM [facility] [phone] or if no answer [phone]
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
## 2016-11-21 NOTE — ED Notes (Signed)
Breakfast ordered for PT at this time.

## 2016-11-21 NOTE — Progress Notes (Signed)
ANTICOAGULATION CONSULT NOTE - Follow Up Consult  Pharmacy Consult for Heparin Indication: pulmonary embolus  Allergies  Allergen Reactions  . Codeine Nausea Only    Patient Measurements: Height: 5\' 5"  (165.1 cm) Weight: 222 lb (100.7 kg) IBW/kg (Calculated) : 57 Heparin Dosing Weight: 80 kg  Vital Signs: Temp: 98.9 F (37.2 C) (09/30 2016) Temp Source: Oral (09/30 2016) BP: 139/97 (09/30 2016) Pulse Rate: 97 (09/30 2016)  Labs:  Recent Labs  11/20/16 1704 11/21/16 0021 11/21/16 0614 11/21/16 1128 11/21/16 1341 11/21/16 2101  HGB 11.1*  --  10.6*  --   --  10.7*  HCT 35.3*  --  34.3*  --   --  34.4*  PLT 170  --  207  --   --  212  HEPARINUNFRC  --   --  1.38*  --  1.10* 0.65  CREATININE 1.05*  --  0.95  --   --   --   TROPONINI  --  <0.03 <0.03 <0.03  --   --     Estimated Creatinine Clearance: 86.1 mL/min (by C-G formula based on SCr of 0.95 mg/dL).   Medications:  Infusions:  . sodium chloride    . heparin 750 Units/hr (11/21/16 1931)    Assessment: 46 year old female on IV Heparin for acute bilateral pulmonary embolus with some right heart strain (RV/LV 1.19).  Note patient has history of IUD-induced pulmonary embolus and was on Xarelto back in 2015 for 6 months but has not been on anticoagulation since that time.  Heparin levels have been elevated. Now heparin level in range at 0.65 units/mL on heparin at 750 units/hr.  Hgb 10.7, plts 212. No bleeding noted.   Goal of Therapy:  Heparin level 0.3-0.7 units/ml Monitor platelets by anticoagulation protocol: Yes   Plan:  Continue heparin at 750 units/hr Daily heparin level and CBC  Monitor for any bleeding   Cristal Qadir D. Jionni Helming, PharmD, BCPS Clinical Pharmacist 11/21/2016 10:36 PM

## 2016-11-21 NOTE — ED Notes (Signed)
Pt eating lunch tray. Pt given coca cola.

## 2016-11-22 ENCOUNTER — Inpatient Hospital Stay (HOSPITAL_COMMUNITY): Payer: BLUE CROSS/BLUE SHIELD

## 2016-11-22 DIAGNOSIS — I2699 Other pulmonary embolism without acute cor pulmonale: Secondary | ICD-10-CM

## 2016-11-22 DIAGNOSIS — Z86711 Personal history of pulmonary embolism: Secondary | ICD-10-CM

## 2016-11-22 DIAGNOSIS — I361 Nonrheumatic tricuspid (valve) insufficiency: Secondary | ICD-10-CM

## 2016-11-22 LAB — ECHOCARDIOGRAM COMPLETE
E decel time: 222 msec
FS: 33 % (ref 28–44)
Height: 65 in
IVS/LV PW RATIO, ED: 1.1
LA ID, A-P, ES: 36 mm
LA diam end sys: 36 mm
LA diam index: 1.64 cm/m2
LA vol A4C: 34.2 ml
LA vol index: 19.5 mL/m2
LA vol: 42.8 mL
LV PW d: 10 mm — AB (ref 0.6–1.1)
LV e' LATERAL: 13.7 cm/s
LVOT area: 2.54 cm2
LVOT diameter: 18 mm
Lateral S' vel: 8.92 cm/s
MV Dec: 222
MV pk E vel: 1 m/s
Reg peak vel: 255 cm/s
TAPSE: 18.3 mm
TDI e' lateral: 13.7
TDI e' medial: 8.49
TR max vel: 255 cm/s
Weight: 3556.8 oz

## 2016-11-22 LAB — HEPARIN LEVEL (UNFRACTIONATED): Heparin Unfractionated: 0.54 IU/mL (ref 0.30–0.70)

## 2016-11-22 LAB — CBC
HCT: 34.1 % — ABNORMAL LOW (ref 36.0–46.0)
Hemoglobin: 10.3 g/dL — ABNORMAL LOW (ref 12.0–15.0)
MCH: 26.6 pg (ref 26.0–34.0)
MCHC: 30.2 g/dL (ref 30.0–36.0)
MCV: 88.1 fL (ref 78.0–100.0)
Platelets: 212 10*3/uL (ref 150–400)
RBC: 3.87 MIL/uL (ref 3.87–5.11)
RDW: 13.9 % (ref 11.5–15.5)
WBC: 6.1 10*3/uL (ref 4.0–10.5)

## 2016-11-22 MED ORDER — RIVAROXABAN 15 MG PO TABS
15.0000 mg | ORAL_TABLET | Freq: Two times a day (BID) | ORAL | Status: DC
  Administered 2016-11-22: 15 mg via ORAL
  Filled 2016-11-22: qty 1

## 2016-11-22 MED ORDER — RIVAROXABAN 15 MG PO TABS: 15.0000 mg | ORAL_TABLET | Freq: Two times a day (BID) | ORAL | 0 refills | Status: DC

## 2016-11-22 NOTE — Progress Notes (Signed)
*  PRELIMINARY RESULTS* Vascular Ultrasound Bilateral lower extremity venous duplex has been completed.  Preliminary findings: Findings consistent with focal area of chronic deep vein thrombosis involving the proximal popliteal vein of the right lower extremity.  Other visualized veins of the lower extremities appear negative for deep vein thrombosis. Negative for bakers cysts bilaterally. Preliminary results called to patients nurse, [facility] @ 13:14  [place] 11/22/2016, 1:13 PM

## 2016-11-22 NOTE — Progress Notes (Signed)
  Echocardiogram 2D Echocardiogram has been performed.  Marissa [name] G Marissa [name] 11/22/2016, 2:21 PM

## 2016-11-22 NOTE — Care Management Note (Signed)
Case Management Note Marissa Gibbons RN, BSN Unit 4E-Case Manager [phone]  Patient Details  Name: Marissa [name] MRN: [id number] Date of Birth: 03-25-1970  Subjective/Objective:   Pt admitted with bil PE                 Action/Plan: PTA pt lived at home- independent-- referral for noac cost- per insurance check- pt has not met deductible for year yet- copay cost would be out of pocket for either drug- per MD plan to d/c on Xarelto- pt provided 30 day free card along with copay assist card to use on discharge- pt unsure if she has used 30 day free card in past and if she has will not be able to use again (one time lifetime use)- informed pt where to fill if provided starter pack script ([facility] on [address])- pt also needs PCP- provided with [facility] # for physician referral assistance.   Expected Discharge Date:    11/22/16             Expected Discharge Plan:  Home/Self Care  In-House Referral:     Discharge planning Services  CM Consult, Medication Assistance  Post Acute Care Choice:  NA Choice offered to:     DME Arranged:    DME Agency:     HH Arranged:    HH Agency:     Status of Service:  Completed, signed off  If discussed at [place] of Stay Meetings, dates discussed:    Discharge Disposition: home/self care   Additional Comments:  Marissa Patricia, RN 11/22/2016, 4:47 PM

## 2016-11-22 NOTE — Progress Notes (Signed)
Name: Marissa [name] MRN: [id number] DOB: 1970/02/24    ADMISSION DATE:  11/20/2016 CONSULTATION DATE:  9/30  REFERRING MD :  [facility] , Dr. Maudie Mercury   CHIEF COMPLAINT:  PE   BRIEF PATIENT DESCRIPTION: 46 yo female with hx of PE in past (treated with xarelto) admitted 11/21/16 with Acute bilateral PE with right heart strain on CT chest . Pulmonary consulted to evaluate for need of possible EKOS.   SIGNIFICANT EVENTS  Admitted 9/29 with acute PE   STUDIES:  CT chest 9/29 acute PE bilateral with RHS 1.2 ratio.     SUBJECTIVE:  Denies SOB, chest pain.  Comfortable on room air.  Has been up and walked around room.  VITAL SIGNS: Temp:  [98 F (36.7 C)-98.9 F (37.2 C)] 98.3 F (36.8 C) (10/01 0752) Pulse Rate:  [76-102] 95 (10/01 0752) Resp:  [14-26] 24 (10/01 0752) BP: (113-144)/(62-97) 136/97 (10/01 0752) SpO2:  [93 %-100 %] 93 % (10/01 0752) Weight:  [100.8 kg (222 lb 4.8 oz)] 100.8 kg (222 lb 4.8 oz) (10/01 0400)  PHYSICAL EXAMINATION: General: Adult female, resting in bed, in NAD. Neuro: A&O x 3, non-focal.  HEENT: [place]/AT. PERRL, sclerae anicteric. Cardiovascular: RRR, no M/R/G.  Lungs: Respirations even and unlabored.  CTA bilaterally, No W/R/R.  Abdomen: BS x 4, soft, NT/ND.  Musculoskeletal: No gross deformities, no edema.  Skin: Intact, warm, no rashes.     Recent Labs Lab 11/20/16 1704 11/21/16 0614  NA 137 140  K 3.8 3.8  CL 107 109  CO2 20* 21*  BUN 9 8  CREATININE 1.05* 0.95  GLUCOSE 140* 119*    Recent Labs Lab 11/21/16 0614 11/21/16 2101 11/22/16 0225  HGB 10.6* 10.7* 10.3*  HCT 34.3* 34.4* 34.1*  WBC 6.8 5.8 6.1  PLT 207 212 212   Dg Chest 2 View  Result Date: 11/20/2016 CLINICAL DATA:  Dyspnea on exertion x3 days. Central chest pain x1 day. EXAM: CHEST  2 VIEW COMPARISON:  03/29/2013 FINDINGS: The heart size and mediastinal contours are within normal limits. No pneumonic consolidation, effusion or pneumothorax. The visualized skeletal  structures are unremarkable. IMPRESSION: No active cardiopulmonary disease. Electronically Signed   By: Ashley Royalty M.D.   On: 11/20/2016 17:53   Ct Angio Chest Pe W And/or Wo Contrast  Result Date: 11/20/2016 CLINICAL DATA:  PE suspected, high pretest prob.  Dyspnea. EXAM: CT ANGIOGRAPHY CHEST WITH CONTRAST TECHNIQUE: Multidetector CT imaging of the chest was performed using the standard protocol during bolus administration of intravenous contrast. Multiplanar CT image reconstructions and MIPs were obtained to evaluate the vascular anatomy. CONTRAST:  70 cc Isovue 370 IV COMPARISON:  Radiograph earlier this day FINDINGS: Cardiovascular: Examination is positive for bilateral pulmonary emboli with filling defects in the distal main pulmonary arteries extending into all lobar and many segmental branches. Thromboembolic burden is moderate. There is straightening of the intraventricular septum with elevated RV to LV ratio of 1.19. Normal caliber thoracic aorta without dissection. The heart is normal in size. Mediastinum/Nodes: No mediastinal or hilar adenopathy. The esophagus is decompressed. Visualized thyroid gland is normal. Lungs/Pleura: No evidence of pulmonary infarct. Mild atelectasis in the anterior right middle lobe. Lungs are otherwise clear. No pleural fluid or pulmonary edema. Upper Abdomen: Surgical clips in the upper abdomen. No acute abnormality. Musculoskeletal: There are no acute or suspicious osseous abnormalities. Review of the MIP images confirms the above findings. IMPRESSION: Positive for acute PE with filling defects and bilateral main pulmonary arteries and CT  evidence of right heart strain (RV/LV Ratio = 1.2) consistent with at least submassive (intermediate risk) PE. The presence of right heart strain has been associated with an increased risk of morbidity and mortality. Please activate Code PE by paging [phone]. Critical Value/emergent results were called by telephone at the time of  interpretation on 11/20/2016 at 11:03 pm to Dr. Addison Lank , who verbally acknowledged these results. Electronically Signed   By: Jeb Levering M.D.   On: 11/20/2016 23:04    ASSESSMENT / PLAN: Bilateral PE - Recurrent; non-provoked..  Pt has moderate clot burden on CT chest with CT suggesting right heart strain (ratio 1.2). She is hemodynamically stable with normal blood pressure, HR and good oxygenation on room air. Currently would not recommended targeted catheter guided thrombolysis (EKOS) .  Would check 2 D echo for right heart strain and if present will need to re-evaluate.  10/1 > remains hemodynamically stable and tolerating heparin well.  Echo and LE duplex still pending. Troponin remained flat.  Plan  F/u on echo - look for evidence of mod to significant right heart strain . If present then will need to re-consider EKOS; however, suspect that systemic anticoagulation alone will suffice. F/u on LE duplex. Continue heparin drip and transition to xarelto (this is what she was previously on and is comfortable with resuming it now). Needs lifelong anticoagulation. Could consider hematology referral in OP setting considering age.   Nothing further to add.  Please call back if echo reveals mod to significant right heart strain or if pt becomes hemodynamically unstable.  In the meantime, PCCM will sign off.   Montey Hora, [facility] Pager: [phone]  or [phone] 11/22/2016, 8:52 AM

## 2016-11-22 NOTE — Discharge Instructions (Signed)
Information on my medicine - XARELTO (rivaroxaban)  This medication education was reviewed with me or my healthcare representative as part of my discharge preparation.  The pharmacist that spoke with me during my hospital stay was:  Betsey Holiday, [place]? Xarelto was prescribed to treat blood clots that may have been found in the veins of your legs (deep vein thrombosis) or in your lungs (pulmonary embolism) and to reduce the risk of them occurring again.  What do you need to know about Xarelto? The starting dose is one 15 mg tablet taken TWICE daily with food for the FIRST 21 DAYS then the dose is changed to one 20 mg tablet taken ONCE A DAY with your evening meal.  DO NOT stop taking Xarelto without talking to the health care provider who prescribed the medication.  Refill your prescription for 20 mg tablets before you run out.  After discharge, you should have regular check-up appointments with your healthcare provider that is prescribing your Xarelto.  In the future your dose may need to be changed if your kidney function changes by a significant amount.  What do you do if you miss a dose? If you are taking Xarelto TWICE DAILY and you miss a dose, take it as soon as you remember. You may take two 15 mg tablets (total 30 mg) at the same time then resume your regularly scheduled 15 mg twice daily the next day.  If you are taking Xarelto ONCE DAILY and you miss a dose, take it as soon as you remember on the same day then continue your regularly scheduled once daily regimen the next day. Do not take two doses of Xarelto at the same time.   Important Safety Information Xarelto is a blood thinner medicine that can cause bleeding. You should call your healthcare provider right away if you experience any of the following: ? Bleeding from an injury or your nose that does not stop. ? Unusual colored urine (red or dark brown) or unusual colored stools  (red or black). ? Unusual bruising for unknown reasons. ? A serious fall or if you hit your head (even if there is no bleeding).  Some medicines may interact with Xarelto and might increase your risk of bleeding while on Xarelto. To help avoid this, consult your healthcare provider or pharmacist prior to using any new prescription or non-prescription medications, including herbals, vitamins, non-steroidal anti-inflammatory drugs (NSAIDs) and supplements.  This website has more information on Xarelto: [email or web address]

## 2016-11-22 NOTE — Progress Notes (Signed)
Per insurance check for NOACs  Pt has not met deductible yet- will have high cost for either drug  # 4.  S/ [address]  [place] # [phone]   1.[name] Kitchen ELIQUIS 2.5 MG BID  COVER- YES  CO-PAY- $ 419.71  TIER- 3 DRUG  PRIOR APPROVAL- NO   2. ELIQUIS 5 MG BID  COVER- YES  CO-PAY- $ 419.71  TIER- 3 DRUG  PRIOR APPROVAL- NO   3. XARELTO 15 MG BID  COVER- YES  CO-PAY- $ 838.40  TIER- 3 DRUG  PRIOR APPROVAL- NO   4. XARELTO 20 MG DAILY  COVER- YES  CO-PAY- $ 419.75  TIER- 3 DRUG  PRIOR APPROVAL- NO   DEDUCTIBLE MET- NOT MET   PHARMACY : [facility]

## 2016-11-22 NOTE — Progress Notes (Signed)
ANTICOAGULATION CONSULT NOTE - Follow Up Consult  Pharmacy Consult for Heparin Indication: pulmonary embolus  Allergies  Allergen Reactions  . Codeine Nausea Only    Patient Measurements: Height: 5\' 5"  (165.1 cm) Weight: 222 lb 4.8 oz (100.8 kg) IBW/kg (Calculated) : 57 Heparin Dosing Weight: 80 kg  Vital Signs: Temp: 98.3 F (36.8 C) (10/01 0752) Temp Source: Oral (10/01 0752) BP: 136/97 (10/01 0752) Pulse Rate: 95 (10/01 0752)  Labs:  Recent Labs  11/20/16 1704 11/21/16 0021  11/21/16 1287 11/21/16 1128 11/21/16 1341 11/21/16 2101 11/22/16 0225  HGB 11.1*  --   --  10.6*  --   --  10.7* 10.3*  HCT 35.3*  --   --  34.3*  --   --  34.4* 34.1*  PLT 170  --   --  207  --   --  212 212  HEPARINUNFRC  --   --   < > 1.38*  --  1.10* 0.65 0.54  CREATININE 1.05*  --   --  0.95  --   --   --   --   TROPONINI  --  <0.03  --  <0.03 <0.03  --   --   --   < > = values in this interval not displayed.  Estimated Creatinine Clearance: 86.1 mL/min (by C-G formula based on SCr of 0.95 mg/dL).   Medications:  Infusions:  . sodium chloride    . heparin 750 Units/hr (11/21/16 1931)    Assessment: 46 year old female on IV Heparin for acute bilateral pulmonary embolus with some right heart strain (RV/LV 1.19). 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   Note patient has history of IUD-induced pulmonary embolus and was on Xarelto back in 2015 for 6 months but has not been on anticoagulation since that time.  Pharmacy consulted to dose Xarelto. CBC remains stable. No signs/symptoms of bleeding. Renal function remains within normal limits.   Goal of Therapy:  Monitor platelets by anticoagulation protocol: Yes   Plan:  1. Discontinue heparin infusion 2. Start xarelto 15 mg twice daily for 21 days followed by 20 mg once daily 3. Monitor CBC, renal function, and for s/s of bleeding  Doylene Canard, PharmD Clinical Pharmacist  Phone: [phone] 11/22/2016, 4:26 PM

## 2016-11-22 NOTE — Progress Notes (Signed)
ANTICOAGULATION CONSULT NOTE - Follow Up Consult  Pharmacy Consult for Heparin Indication: pulmonary embolus  Allergies  Allergen Reactions  . Codeine Nausea Only    Patient Measurements: Height: 5\' 5"  (165.1 cm) Weight: 222 lb 4.8 oz (100.8 kg) IBW/kg (Calculated) : 57 Heparin Dosing Weight: 80 kg  Vital Signs: Temp: 98.3 F (36.8 C) (10/01 0752) Temp Source: Oral (10/01 0752) BP: 136/97 (10/01 0752) Pulse Rate: 95 (10/01 0752)  Labs:  Recent Labs  11/20/16 1704 11/21/16 0021  11/21/16 0017 11/21/16 1128 11/21/16 1341 11/21/16 2101 11/22/16 0225  HGB 11.1*  --   --  10.6*  --   --  10.7* 10.3*  HCT 35.3*  --   --  34.3*  --   --  34.4* 34.1*  PLT 170  --   --  207  --   --  212 212  HEPARINUNFRC  --   --   < > 1.38*  --  1.10* 0.65 0.54  CREATININE 1.05*  --   --  0.95  --   --   --   --   TROPONINI  --  <0.03  --  <0.03 <0.03  --   --   --   < > = values in this interval not displayed.  Estimated Creatinine Clearance: 86.1 mL/min (by C-G formula based on SCr of 0.95 mg/dL).   Medications:  Infusions:  . sodium chloride    . heparin 750 Units/hr (11/21/16 1931)    Assessment: 46 year old female on IV Heparin for acute bilateral pulmonary embolus with some right heart strain (RV/LV 1.19).  Note patient has history of IUD-induced pulmonary embolus and was on Xarelto back in 2015 for 6 months but has not been on anticoagulation since that time.  Heparin level remains therapeutic this am at 0.54. CBC remains stable.   Hgb 10.3, plts 212. No bleeding noted.  Goal of Therapy:  Heparin level 0.3-0.7 units/ml Monitor platelets by anticoagulation protocol: Yes   Plan:  1. Continue heparin at 750 units/hr 2. Daily heparin level and CBC  3. Monitor for any bleeding   Vincenza Hews, PharmD, BCPS 11/22/2016, 7:58 AM

## 2016-11-22 NOTE — Progress Notes (Signed)
Discussed with the patient and all questioned fully answered. She will call me if any problems arise.  IV removed. Telemetry removed, CCMD notified. Pt given note from MD for work. Given discharge paperwork. Took dose of xarelto prior to DC.   Fritz Pickerel, RN

## 2016-11-24 NOTE — Discharge Summary (Signed)
Physician Discharge Summary  Marissa [name] DUK:[id number] DOB: December 14, 1970 DOA: 11/20/2016  PCP: Patient, No Pcp Per  Admit date: 11/20/2016 Discharge date: 11/22/2016  Admitted From: [facility]. Disposition:  Home.   Recommendations for Outpatient Follow-up:  1. Follow up with PCP in 1-2 weeks 2. Please obtain BMP/CBC in one week Please follow up  With hematology as recommended.   Discharge Condition:stable. CODE STATUS: full code.  Diet recommendation: Heart Healthy   Brief/Interim Summary: Marissa [name] a 46 y.o.female,w hx of PE w negative hypercoag panel, c/o Dyspnea since Tuesday and it was not getting better  And also  had chest discomfort, substernal pressure and therefore presented to ED. She was found to have sub massive PE.  She was Started on IV heparin and admitted .  She has moderate clot burden on CT,with some right heart strain. She is hemodynamically stable and normotensive.  Echocardiogram done and reviewed . Lower extremity duplex done and reviewed.  PCCM consulted for right heart strain and recommendations given.  Since she had a h/o of PE in the past, she will need life long anti coagulation.  Will also need outpatient referral for hematology.  She was ultimately transitioned to oral xarelto and discharged home to follow up with PCP and hematology as recommended.   Discharge Diagnoses:  Principal Problem:   Pulmonary embolism ([place]) Active Problems:   Anemia    Discharge Instructions  Discharge Instructions    Diet - low sodium heart healthy    Complete by:  As directed    Discharge instructions    Complete by:  As directed    [facility] PCP in one week.  Please follow up with hematology as recommended for further evaluation.  Please check cbc in one week.     Allergies as of 11/22/2016      Reactions   Codeine Nausea Only      Medication List    STOP taking these medications   ibuprofen 600 MG tablet Commonly known as:   ADVIL,MOTRIN     TAKE these medications   cholecalciferol 1000 units tablet Commonly known as:  VITAMIN D Take 2,000 Units by mouth daily.   cyclobenzaprine 10 MG tablet Commonly known as:  FLEXERIL Take 1 tablet (10 mg total) by mouth 2 (two) times daily as needed for muscle spasms.   multivitamin with minerals Tabs tablet Take 1 tablet by mouth daily.   Rivaroxaban 15 MG Tabs tablet Commonly known as:  XARELTO Take 1 tablet (15 mg total) by mouth 2 (two) times daily.      Follow-up Information    Heath Lark, MD. Schedule an appointment as soon as possible for a visit in 2 week(s).   Specialty:  Hematology and Oncology Why:  for RECURRENT PE. Contact information: [place] [address] [phone]          Allergies  Allergen Reactions  . Codeine Nausea Only    Consultations:  PCCM.    Procedures/Studies: Dg Chest 2 View  Result Date: 11/20/2016 CLINICAL DATA:  Dyspnea on exertion x3 days. Central chest pain x1 day. EXAM: CHEST  2 VIEW COMPARISON:  03/29/2013 FINDINGS: The heart size and mediastinal contours are within normal limits. No pneumonic consolidation, effusion or pneumothorax. The visualized skeletal structures are unremarkable. IMPRESSION: No active cardiopulmonary disease. Electronically Signed   By: Ashley Royalty M.D.   On: 11/20/2016 17:53   Ct Angio Chest Pe W And/or Wo Contrast  Result Date: 11/20/2016 CLINICAL DATA:  PE suspected, high pretest prob.  Dyspnea. EXAM: CT ANGIOGRAPHY CHEST WITH CONTRAST TECHNIQUE: Multidetector CT imaging of the chest was performed using the standard protocol during bolus administration of intravenous contrast. Multiplanar CT image reconstructions and MIPs were obtained to evaluate the vascular anatomy. CONTRAST:  70 cc Isovue 370 IV COMPARISON:  Radiograph earlier this day FINDINGS: Cardiovascular: Examination is positive for bilateral pulmonary emboli with filling defects in the distal  main pulmonary arteries extending into all lobar and many segmental branches. Thromboembolic burden is moderate. There is straightening of the intraventricular septum with elevated RV to LV ratio of 1.19. Normal caliber thoracic aorta without dissection. The heart is normal in size. Mediastinum/Nodes: No mediastinal or hilar adenopathy. The esophagus is decompressed. Visualized thyroid gland is normal. Lungs/Pleura: No evidence of pulmonary infarct. Mild atelectasis in the anterior right middle lobe. Lungs are otherwise clear. No pleural fluid or pulmonary edema. Upper Abdomen: Surgical clips in the upper abdomen. No acute abnormality. Musculoskeletal: There are no acute or suspicious osseous abnormalities. Review of the MIP images confirms the above findings. IMPRESSION: Positive for acute PE with filling defects and bilateral main pulmonary arteries and CT evidence of right heart strain (RV/LV Ratio = 1.2) consistent with at least submassive (intermediate risk) PE. The presence of right heart strain has been associated with an increased risk of morbidity and mortality. Please activate Code PE by paging [phone]. Critical Value/emergent results were called by telephone at the time of interpretation on 11/20/2016 at 11:03 pm to Dr. Addison Lank , who verbally acknowledged these results. Electronically Signed   By: Jeb Levering M.D.   On: 11/20/2016 23:04    Echocardiogram.    Subjective: No new complaints.   Discharge Exam: Vitals:   11/22/16 0400 11/22/16 0752  BP: 113/62 (!) 136/97  Pulse: 76 95  Resp: 14 (!) 24  Temp: 98 F (36.7 C) 98.3 F (36.8 C)  SpO2: 95% 93%   Vitals:   11/21/16 2016 11/22/16 0000 11/22/16 0400 11/22/16 0752  BP: (!) 139/97 127/86 113/62 (!) 136/97  Pulse: 97 88 76 95  Resp: (!) 23 16 14  (!) 24  Temp: 98.9 F (37.2 C)  98 F (36.7 C) 98.3 F (36.8 C)  TempSrc: Oral  Oral Oral  SpO2: 99% 95% 95% 93%  Weight:   100.8 kg (222 lb 4.8 oz)   Height:         General: Pt is alert, awake, not in acute distress Cardiovascular: RRR, S1/S2 +, no rubs, no gallops Respiratory: CTA bilaterally, no wheezing, no rhonchi Abdominal: Soft, NT, ND, bowel sounds + Extremities: no edema, no cyanosis    The results of significant diagnostics from this hospitalization (including imaging, microbiology, ancillary and laboratory) are listed below for reference.     Microbiology: No results found for this or any previous visit (from the past 240 hour(s)).   Labs: BNP (last 3 results) No results for input(s): BNP in the last 8760 hours. Basic Metabolic Panel:  Recent Labs Lab 11/20/16 1704 11/21/16 0614  NA 137 140  K 3.8 3.8  CL 107 109  CO2 20* 21*  GLUCOSE 140* 119*  BUN 9 8  CREATININE 1.05* 0.95  CALCIUM 9.1 8.9   Liver Function Tests:  Recent Labs Lab 11/21/16 0614  AST 19  ALT 19  ALKPHOS 79  BILITOT 0.6  PROT 6.6  ALBUMIN 3.6   No results for input(s): LIPASE, AMYLASE in the last 168 hours. No results for input(s): AMMONIA in the  last 168 hours. CBC:  Recent Labs Lab 11/20/16 1704 11/21/16 0614 11/21/16 2101 11/22/16 0225  WBC 5.8 6.8 5.8 6.1  HGB 11.1* 10.6* 10.7* 10.3*  HCT 35.3* 34.3* 34.4* 34.1*  MCV 88.9 87.9 88.0 88.1  PLT 170 207 212 212   Cardiac Enzymes:  Recent Labs Lab 11/21/16 0021 11/21/16 0614 11/21/16 1128  TROPONINI <0.03 <0.03 <0.03   BNP: Invalid input(s): POCBNP CBG: No results for input(s): GLUCAP in the last 168 hours. D-Dimer No results for input(s): DDIMER in the last 72 hours. Hgb A1c No results for input(s): HGBA1C in the last 72 hours. Lipid Profile No results for input(s): CHOL, HDL, LDLCALC, TRIG, CHOLHDL, LDLDIRECT in the last 72 hours. Thyroid function studies No results for input(s): TSH, T4TOTAL, T3FREE, THYROIDAB in the last 72 hours.  Invalid input(s): FREET3 Anemia work up No results for input(s): VITAMINB12, FOLATE, FERRITIN, TIBC, IRON, RETICCTPCT in the last  72 hours. Urinalysis    Component Value Date/Time   COLORURINE DARK YELLOW 08/21/2015 1103   APPEARANCEUR CLEAR 08/21/2015 1103   LABSPEC 1.014 08/21/2015 1103   PHURINE 6.0 08/21/2015 1103   GLUCOSEU NEGATIVE 08/21/2015 1103   HGBUR 3+ (A) 08/21/2015 1103   BILIRUBINUR NEGATIVE 08/21/2015 1103   BILIRUBINUR n 11/22/2013 1028   KETONESUR NEGATIVE 08/21/2015 1103   PROTEINUR TRACE (A) 08/21/2015 1103   UROBILINOGEN 0.2 11/22/2013 1028   UROBILINOGEN 1.0 11/28/2012 1125   NITRITE NEGATIVE 08/21/2015 1103   LEUKOCYTESUR NEGATIVE 08/21/2015 1103   Sepsis Labs Invalid input(s): PROCALCITONIN,  WBC,  LACTICIDVEN Microbiology No results found for this or any previous visit (from the past 240 hour(s)).   Time coordinating discharge: Over 30 minutes  SIGNED:   Hosie Poisson, MD  [facility] 11/24/2016, 8:18 AM Pager   If 7PM-7AM, please contact night-coverage [email or web address] Password [email or web address]

## 2016-11-25 ENCOUNTER — Telehealth: Payer: Self-pay | Admitting: *Deleted

## 2016-11-25 ENCOUNTER — Encounter: Payer: Self-pay | Admitting: Family Medicine

## 2016-11-25 ENCOUNTER — Encounter: Payer: Self-pay | Admitting: Hematology and Oncology

## 2016-11-25 ENCOUNTER — Encounter: Payer: Self-pay | Admitting: Emergency Medicine

## 2016-11-25 ENCOUNTER — Ambulatory Visit (INDEPENDENT_AMBULATORY_CARE_PROVIDER_SITE_OTHER): Payer: BLUE CROSS/BLUE SHIELD | Admitting: Family Medicine

## 2016-11-25 ENCOUNTER — Telehealth: Payer: Self-pay | Admitting: Hematology and Oncology

## 2016-11-25 VITALS — BP 124/80 | HR 93 | Temp 98.3°F | Ht 65.5 in | Wt 221.1 lb

## 2016-11-25 DIAGNOSIS — Z7689 Persons encountering health services in other specified circumstances: Secondary | ICD-10-CM | POA: Diagnosis not present

## 2016-11-25 DIAGNOSIS — Z23 Encounter for immunization: Secondary | ICD-10-CM

## 2016-11-25 DIAGNOSIS — I2699 Other pulmonary embolism without acute cor pulmonale: Secondary | ICD-10-CM | POA: Diagnosis not present

## 2016-11-25 LAB — CBC WITH DIFFERENTIAL/PLATELET
Basophils Absolute: 0 10*3/uL (ref 0.0–0.1)
Basophils Relative: 0.8 % (ref 0.0–3.0)
Eosinophils Absolute: 0.1 10*3/uL (ref 0.0–0.7)
Eosinophils Relative: 1.9 % (ref 0.0–5.0)
HCT: 39 % (ref 36.0–46.0)
Hemoglobin: 12.5 g/dL (ref 12.0–15.0)
Lymphocytes Relative: 35.1 % (ref 12.0–46.0)
Lymphs Abs: 1.7 10*3/uL (ref 0.7–4.0)
MCHC: 32 g/dL (ref 30.0–36.0)
MCV: 87.9 fl (ref 78.0–100.0)
Monocytes Absolute: 0.3 10*3/uL (ref 0.1–1.0)
Monocytes Relative: 7.1 % (ref 3.0–12.0)
Neutro Abs: 2.7 10*3/uL (ref 1.4–7.7)
Neutrophils Relative %: 55.1 % (ref 43.0–77.0)
Platelets: 249 10*3/uL (ref 150.0–400.0)
RBC: 4.43 Mil/uL (ref 3.87–5.11)
RDW: 14.1 % (ref 11.5–15.5)
WBC: 4.9 10*3/uL (ref 4.0–10.5)

## 2016-11-25 LAB — BASIC METABOLIC PANEL
BUN: 9 mg/dL (ref 6–23)
CO2: 23 mEq/L (ref 19–32)
Calcium: 9.5 mg/dL (ref 8.4–10.5)
Chloride: 106 mEq/L (ref 96–112)
Creatinine, Ser: 0.99 mg/dL (ref 0.40–1.20)
GFR: 77.18 mL/min (ref >60.00)
Glucose, Bld: 107 mg/dL — ABNORMAL HIGH (ref 70–99)
Potassium: 3.7 mEq/L (ref 3.5–5.1)
Sodium: 139 mEq/L (ref 135–145)

## 2016-11-25 NOTE — Addendum Note (Signed)
Addended by: Milford Cage on: 11/25/2016 11:54 AM   Modules accepted: Orders

## 2016-11-25 NOTE — Addendum Note (Signed)
Addended by: Netta Neat D on: 11/25/2016 01:59 PM   Modules accepted: Orders

## 2016-11-25 NOTE — Telephone Encounter (Signed)
FYI

## 2016-11-25 NOTE — Telephone Encounter (Signed)
Appt has been scheduled for the pt to see Dr. Lebron Conners on 10/9 at 11am. Pt was seen in the hospital for PE and was told to schedule a hematology appt. Pt aware to arrive 30 minutes early. Letter mailed
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
## 2016-11-25 NOTE — Telephone Encounter (Signed)
Renee from [facility] called to let the nurse know the patient has been enrolled case management program. Please call Renee with any questions [phone] ext [phone]

## 2016-11-25 NOTE — Patient Instructions (Addendum)
Pulmonary Embolism A pulmonary embolism (PE) is a sudden blockage or decrease of blood flow in one lung or both lungs. Most blockages come from a blood clot that travels from the legs or the pelvis to the lungs. PE is a dangerous and potentially life-threatening condition if it is not treated right away. What are the causes? A pulmonary embolism occurs most commonly when a blood clot travels from one of your veins to your lungs. Rarely, PE is caused by air, fat, amniotic fluid, or part of a tumor traveling through your veins to your lungs. What increases the risk? A PE is more likely to develop in:  People who smoke.  People who areolder, especially over 17 years of age.  People who are overweight (obese).  People who sit or lie still for a long time, such as during long-distance travel (over 4 hours), bed rest, hospitalization, or during recovery from certain medical conditions like a stroke.  People who do not engage in much physical activity (sedentary lifestyle).  People who have chronic breathing disorders.  People whohave a personal or family history of blood clots or blood clotting disease.  People whohave peripheral vascular disease (PVD), diabetes, or some types of cancer.  People who haveheart disease, especially if the person had a recent heart attack or has congestive heart failure.  People who have neurological diseases that affect the legs (leg paresis).  People who have had a traumatic injury, such as breaking a hip or leg.  People whohave recently had major or lengthy surgery, especially on the hip, knee, or abdomen.  People who have hada central line placed inside a large vein.  People who takemedicines that contain the hormone estrogen. These include birth control pills and hormone replacement therapy.  Pregnancy or during childbirth or the postpartum period.  What are the signs or symptoms? The symptoms of a PE usually start suddenly and  include:  Shortness of breath while active or at rest.  Coughing or coughing up blood or blood-tinged mucus.  Chest pain that is often worse with deep breaths.  Rapid or irregular heartbeat.  Feeling light-headed or dizzy.  Fainting.  Feelinganxious.  Sweating.  There may also be pain and swelling in a leg if that is where the blood clot started. These symptoms may represent a serious problem that is an emergency. Do not wait to see if the symptoms will go away. Get medical help right away. Call your local emergency services (911 in the [place] Do not drive yourself to the hospital. How is this diagnosed? Your health care provider will take a medical history and perform a physical exam. You may also have other tests, including:  Blood tests to assess the clotting properties of your blood, assess oxygen levels in your blood, and find blood clots.  Imaging tests, such as CT, ultrasound, MRI, X-ray, and other tests to see if you have clots anywhere in your body.  An electrocardiogram (ECG) to look for heart strain from blood clots in the lungs.  How is this treated? The main goals of PE treatment are:  To stop a blood clot from growing larger.  To stop new blood clots from forming.  The type of treatment that you receive depends on many factors, such as the cause of your PE, your risk for bleeding or developing more clots, and other medical conditions that you have. Sometimes, a combination of treatments is necessary. This condition may be treated with:  Medicines, including newer oral blood thinners (  anticoagulants), warfarin, low molecular weight heparins, thrombolytics, or heparins.  Wearing compression stockings or using different types of devices.  Surgery (rare) to remove the blood clot or to place a filter in your abdomen to stop the blood clot from traveling to your lungs.  Treatments for a PE are often divided into immediate treatment, long-term treatment (up to 3  months after PE), and extended treatment (more than 3 months after PE). Your treatment may continue for several months. This is called maintenance therapy, and it is used to prevent the forming of new blood clots. You can work with your health care provider to choose the treatment program that is best for you. What are anticoagulants? Anticoagulants are medicines that treat PEs. They can stop current blood clots from growing and stop new clots from forming. They cannot dissolve existing clots. Your body dissolves clots by itself over time. Anticoagulants are given by mouth, by injection, or through an IV tube. What are thrombolytics? Thrombolytics are clot-dissolving medicines that are used to dissolve a PE. They carry a high risk of bleeding, so they tend to be used only in severe cases or if you have very low blood pressure. Follow these instructions at home: If you are taking a newer oral anticoagulant:  Take the medicine every single day at the same time each day.  Understand what foods and drugs interact with this medicine.  Understand that there are no regular blood tests required when using this medicine.  Understandthe side effects of this medicine, including excessive bruising or bleeding. Ask your health care provider or pharmacist about other possible side effects. If you are taking warfarin:  Understand how to take warfarin and know which foods can affect how warfarin works in Veterinary surgeon.  Understand that it is dangerous to taketoo much or too little warfarin. Too much warfarin increases the risk of bleeding. Too little warfarin continues to allow the risk for blood clots.  Follow your PT and INR blood testing schedule. The PT and INR results allow your health care provider to adjust your dose of warfarin. It is very important that you have your PT and INR tested as often as told by your health care provider.  Avoid major changes in your diet, or tell your health care provider  before you change your diet. Arrange a visit with a registered dietitian to answer your questions. Many foods, especially foods that are high in vitamin K, can interfere with warfarin and affect the PT and INR results. Eat a consistent amount of foods that are high in vitamin K, such as: ? Spinach, kale, broccoli, cabbage, collard greens, turnip greens, Brussels sprouts, peas, cauliflower, seaweed, and parsley. ? Beef liver and pork liver. ? Green tea. ? Soybean oil.  Tell your health care provider about any and all medicines, vitamins, and supplements that you take, including aspirin and other over-the-counter anti-inflammatory medicines. Be especially cautious with aspirin and anti-inflammatory medicines. Do not take those before you ask your health care provider if it is safe to do so. This is important because many medicines can interfere with warfarin and affect the PT and INR results.  Do not start or stop taking any over-the-counter or prescription medicine unless your health care provider or pharmacist tells you to do so. If you take warfarin, you will also need to do these things:  Hold pressure over cuts for longer than usual.  Tell your dentist and other health care providers that you are taking warfarin before you have  any procedures in which bleeding may occur.  Avoid alcohol or drink very small amounts. Tell your health care provider if you change your alcohol intake.  Do not use tobacco products, including cigarettes, chewing tobacco, and e-cigarettes. If you need help quitting, ask your health care provider.  Avoid contact sports.  General instructions  Take over-the-counter and prescription medicines only as told by your health care provider. Anticoagulant medicines can have side effects, including easy bruising and difficulty stopping bleeding. If you are prescribed an anticoagulant, you will also need to do these things: ? Hold pressure over cuts for longer than  usual. ? Tell your dentist and other health care providers that you are taking anticoagulants before you have any procedures in which bleeding may occur. ? Avoid contact sports.  Wear a medical alert bracelet or carry a medical alert card that says you have had a PE.  Ask your health care provider how soon you can go back to your normal activities. Stay active to prevent new blood clots from forming.  Make sure to exercise while traveling or when you have been sitting or standing for a long period of time. It is very important to exercise. Exercise your legs by walking or by tightening and relaxing your leg muscles often. Take frequent walks.  Wear compression stockings as told by your health care provider to help prevent more blood clots from forming.  Do not use tobacco products, including cigarettes, chewing tobacco, and e-cigarettes. If you need help quitting, ask your health care provider.  Keep all follow-up appointments with your health care provider. This is important. How is this prevented? Take these actions to decrease your risk of developing another PE:  Exercise regularly. For at least 30 minutes every day, engage in: ? Activity that involves moving your arms and legs. ? Activity that encourages good blood flow through your body by increasing your heart rate.  Exercise your arms and legs every hour during long-distance travel (over 4 hours). Drink plenty of water and avoid drinking alcohol while traveling.  Avoid sitting or lying in bed for long periods of time without moving your legs.  Maintain a weight that is appropriate for your height. Ask your health care provider what weight is healthy for you.  If you are a woman who is over 21 years of age, avoid unnecessary use of medicines that contain estrogen. These include birth control pills.  Do not smoke, especially if you take estrogen medicines. If you need help quitting, ask your health care provider.  If you are at  very high risk for PE, wear compression stockings.  If you recently had a PE, have regularly scheduled ultrasound testing on your legs to check for new blood clots.  If you are hospitalized, prevention measures may include:  Early walking after surgery, as soon as your health care provider says that it is safe.  Receiving anticoagulants to prevent blood clots. If you cannot take anticoagulants, other options may be available, such as wearing compression stockings or using different types of devices.  Get help right away if:  You have new or increased pain, swelling, or redness in an arm or leg.  You have numbness or tingling in an arm or leg.  You have shortness of breath while active or at rest.  You have chest pain.  You have a rapid or irregular heartbeat.  You feel light-headed or dizzy.  You cough up blood.  You notice blood in your vomit, bowel movement, or  urine.  You have a fever. These symptoms may represent a serious problem that is an emergency. Do not wait to see if the symptoms will go away. Get medical help right away. Call your local emergency services (911 in the [place] Do not drive yourself to the hospital. This information is not intended to replace advice given to you by your health care provider. Make sure you discuss any questions you have with your health care provider. Document Released: 02/06/2000 Document Revised: 07/17/2015 Document Reviewed: 06/05/2014 Elsevier Interactive Patient Education  2017 [employer].  Remember to schedule your appointment with the Hematologist!  We have ordered labs at this visit. It can take up to 1-2 weeks for results and processing. If results require follow up or explanation, we will call you with instructions. Clinically stable results will be released to your [facility] or sent to you via mail. If you have not heard from [place] or cannot find your results in [facility] in 2 weeks please contact our office at [phone].

## 2016-11-30 ENCOUNTER — Encounter: Payer: Self-pay | Admitting: Hematology and Oncology

## 2016-11-30 ENCOUNTER — Ambulatory Visit (HOSPITAL_BASED_OUTPATIENT_CLINIC_OR_DEPARTMENT_OTHER): Payer: BLUE CROSS/BLUE SHIELD | Admitting: Hematology and Oncology

## 2016-11-30 ENCOUNTER — Ambulatory Visit (HOSPITAL_BASED_OUTPATIENT_CLINIC_OR_DEPARTMENT_OTHER): Payer: BLUE CROSS/BLUE SHIELD

## 2016-11-30 ENCOUNTER — Telehealth: Payer: Self-pay | Admitting: Hematology and Oncology

## 2016-11-30 VITALS — BP 148/82 | HR 94 | Temp 98.8°F | Resp 18 | Ht 65.5 in | Wt 224.3 lb

## 2016-11-30 DIAGNOSIS — I82533 Chronic embolism and thrombosis of popliteal vein, bilateral: Secondary | ICD-10-CM | POA: Diagnosis not present

## 2016-11-30 DIAGNOSIS — I2609 Other pulmonary embolism with acute cor pulmonale: Secondary | ICD-10-CM

## 2016-11-30 DIAGNOSIS — I82531 Chronic embolism and thrombosis of right popliteal vein: Secondary | ICD-10-CM

## 2016-11-30 LAB — CBC WITH DIFFERENTIAL/PLATELET
BASO%: 0.6 % (ref 0.0–2.0)
Basophils Absolute: 0 10*3/uL (ref 0.0–0.1)
EOS%: 2.1 % (ref 0.0–7.0)
Eosinophils Absolute: 0.1 10*3/uL (ref 0.0–0.5)
HCT: 35.3 % (ref 34.8–46.6)
HGB: 11.2 g/dL — ABNORMAL LOW (ref 11.6–15.9)
LYMPH%: 34.8 % (ref 14.0–49.7)
MCH: 27.6 pg (ref 25.1–34.0)
MCHC: 31.8 g/dL (ref 31.5–36.0)
MCV: 86.9 fL (ref 79.5–101.0)
MONO#: 0.3 10*3/uL (ref 0.1–0.9)
MONO%: 7.8 % (ref 0.0–14.0)
NEUT#: 2.2 10*3/uL (ref 1.5–6.5)
NEUT%: 54.7 % (ref 38.4–76.8)
Platelets: 273 10*3/uL (ref 145–400)
RBC: 4.06 10*6/uL (ref 3.70–5.45)
RDW: 14.2 % (ref 11.2–14.5)
WBC: 4 10*3/uL (ref 3.9–10.3)
lymph#: 1.4 10*3/uL (ref 0.9–3.3)

## 2016-11-30 LAB — COMPREHENSIVE METABOLIC PANEL
ALT: 14 U/L (ref 0–55)
AST: 14 U/L (ref 5–34)
Albumin: 3.7 g/dL (ref 3.5–5.0)
Alkaline Phosphatase: 98 U/L (ref 40–150)
Anion Gap: 7 mEq/L (ref 3–11)
BUN: 10.8 mg/dL (ref 7.0–26.0)
CO2: 24 mEq/L (ref 22–29)
Calcium: 9.5 mg/dL (ref 8.4–10.4)
Chloride: 108 mEq/L (ref 98–109)
Creatinine: 1.1 mg/dL (ref 0.6–1.1)
EGFR: 68 mL/min/{1.73_m2} — ABNORMAL LOW (ref >90)
Glucose: 112 mg/dl (ref 70–140)
Potassium: 3.9 mEq/L (ref 3.5–5.1)
Sodium: 139 mEq/L (ref 136–145)
Total Bilirubin: 0.24 mg/dL (ref 0.20–1.20)
Total Protein: 7.4 g/dL (ref 6.4–8.3)

## 2016-11-30 LAB — LACTATE DEHYDROGENASE: LDH: 228 U/L (ref 125–245)

## 2016-11-30 NOTE — Telephone Encounter (Signed)
Gave avs and calendar for January 2019 °

## 2016-12-01 LAB — PROTEIN S, ANTIGEN, FREE: Protein S, Free: 68 % (ref 57–157)

## 2016-12-01 LAB — ANTITHROMBIN III ANTIGEN: AT III AG PPP IMM-ACNC: 105 % (ref 72–124)

## 2016-12-01 LAB — PROTEIN S, TOTAL: Protein S, Total: 92 % (ref 60–150)

## 2016-12-01 LAB — ANTITHROMBIN III: Antithrombin Activity: 145 % — ABNORMAL HIGH (ref 75–135)

## 2016-12-01 LAB — PROTEIN C ACTIVITY: Protein C-Functional: 180 % (ref 73–180)

## 2016-12-01 LAB — PROTEIN S ACTIVITY: Protein S-Functional: 108 % (ref 63–140)

## 2016-12-03 ENCOUNTER — Telehealth: Payer: Self-pay

## 2016-12-03 LAB — PROTEIN C, TOTAL: Protein C Antigen: 143 % (ref 60–150)

## 2016-12-03 NOTE — Telephone Encounter (Signed)
Pt has a disability claim and they have not heard from office. He has taken her out of work to the end of Oct. Disability needs records to support this. And they have not heard from [facility].  Her disability co is [employer], case manager Aline August.

## 2016-12-06 ENCOUNTER — Encounter: Payer: Self-pay | Admitting: Hematology and Oncology
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
## 2016-12-06 NOTE — Assessment & Plan Note (Signed)
46 y.o. female with initial provoked venous thromboembolism in January 2015 due to concurrent oral contraceptive use and relative immobilization of the left lower extremity.  Patient was appropriately treated with a six-month course of rivaroxaban (Xarelto) without complications. Patient has developed an apparently unprovoked clot at the end of September 2018 with right ventricular strain evidenced by CT of the chest as well as residual chronic clot in the right lower extremity.  Due to recurrent thrombosis and severity of the clots, I do recommend patient to remain on therapeutic anticoagulation indefinitely. Patient appears to be tolerating rivaroxaban without significant difficulties. She did not have a new breakthrough thrombosis during the therapeutic course of the medication. Patients cancer screening is up-to-datein regards to the breast and cervical cancer.as she is only 46, she is not due for a colonoscopy at this point in time, but considering the history of unprovoked cloud, evaluation with fecal occult blood test and colorguard is indicated at this time, in my opinion. Agent is a non-smoker and lung imaging is not indicated at this time.  Plan: --Indefinite anticoagulation --FOBT/Cologuard -- will obtain protein C, protein S, and Antithrombin levels on the return to clinic

## 2016-12-15 ENCOUNTER — Other Ambulatory Visit: Payer: Self-pay | Admitting: Hematology and Oncology

## 2016-12-15 ENCOUNTER — Telehealth: Payer: Self-pay

## 2016-12-15 MED ORDER — RIVAROXABAN 20 MG PO TABS: 20.0000 mg | ORAL_TABLET | Freq: Every day | ORAL | 3 refills | Status: DC

## 2016-12-15 NOTE — Telephone Encounter (Signed)
Spoke with patient. Order sent for xarelto to [facility] on [address]. Work letter to be signed by Dr. Lebron Conners and available for p/u tomorrow or Friday. Pt instructed to come to front desk and ask for Aldona Bar, RN and letter would be brought out to her in the lobby. Pt verbalized understanding.

## 2017-01-26 ENCOUNTER — Telehealth: Payer: Self-pay | Admitting: Hematology and Oncology

## 2017-01-26 NOTE — Telephone Encounter (Signed)
Covering for AP moved appt from 1/9

## 2017-02-23 ENCOUNTER — Other Ambulatory Visit: Payer: Self-pay

## 2017-02-23 ENCOUNTER — Inpatient Hospital Stay: Payer: BLUE CROSS/BLUE SHIELD | Attending: Hematology and Oncology

## 2017-02-23 DIAGNOSIS — I82531 Chronic embolism and thrombosis of right popliteal vein: Secondary | ICD-10-CM | POA: Diagnosis not present

## 2017-02-23 DIAGNOSIS — I2609 Other pulmonary embolism with acute cor pulmonale: Secondary | ICD-10-CM | POA: Diagnosis not present

## 2017-02-23 DIAGNOSIS — I82533 Chronic embolism and thrombosis of popliteal vein, bilateral: Secondary | ICD-10-CM | POA: Insufficient documentation

## 2017-02-23 DIAGNOSIS — Z7901 Long term (current) use of anticoagulants: Secondary | ICD-10-CM | POA: Insufficient documentation

## 2017-02-23 LAB — CBC WITH DIFFERENTIAL/PLATELET
BASO%: 1.2 % (ref 0.0–2.0)
Basophils Absolute: 0 10*3/uL (ref 0.0–0.1)
EOS%: 3 % (ref 0.0–7.0)
Eosinophils Absolute: 0.1 10*3/uL (ref 0.0–0.5)
HCT: 29.4 % — ABNORMAL LOW (ref 34.8–46.6)
HGB: 9.1 g/dL — ABNORMAL LOW (ref 11.6–15.9)
LYMPH%: 35.7 % (ref 14.0–49.7)
MCH: 25.5 pg (ref 25.1–34.0)
MCHC: 31.1 g/dL — ABNORMAL LOW (ref 31.5–36.0)
MCV: 82 fL (ref 79.5–101.0)
MONO#: 0.3 10*3/uL (ref 0.1–0.9)
MONO%: 8.1 % (ref 0.0–14.0)
NEUT#: 1.9 10*3/uL (ref 1.5–6.5)
NEUT%: 52 % (ref 38.4–76.8)
Platelets: 311 10*3/uL (ref 145–400)
RBC: 3.59 10*6/uL — ABNORMAL LOW (ref 3.70–5.45)
RDW: 15.4 % — ABNORMAL HIGH (ref 11.2–14.5)
WBC: 3.7 10*3/uL — ABNORMAL LOW (ref 3.9–10.3)
lymph#: 1.3 10*3/uL (ref 0.9–3.3)

## 2017-02-23 LAB — COMPREHENSIVE METABOLIC PANEL
ALT: 15 U/L (ref 0–55)
AST: 15 U/L (ref 5–34)
Albumin: 3.7 g/dL (ref 3.5–5.0)
Alkaline Phosphatase: 97 U/L (ref 40–150)
Anion Gap: 8 mEq/L (ref 3–11)
BUN: 8.3 mg/dL (ref 7.0–26.0)
CO2: 24 mEq/L (ref 22–29)
Calcium: 9.1 mg/dL (ref 8.4–10.4)
Chloride: 107 mEq/L (ref 98–109)
Creatinine: 1 mg/dL (ref 0.6–1.1)
EGFR: >60 mL/min/{1.73_m2} (ref >60)
Glucose: 128 mg/dl (ref 70–140)
Potassium: 3.6 mEq/L (ref 3.5–5.1)
Sodium: 139 mEq/L (ref 136–145)
Total Bilirubin: 0.28 mg/dL (ref 0.20–1.20)
Total Protein: 7.4 g/dL (ref 6.4–8.3)

## 2017-02-23 LAB — LACTATE DEHYDROGENASE: LDH: 193 U/L (ref 125–245)

## 2017-02-24 LAB — PROTEIN C, TOTAL: Protein C Antigen: 154 % — ABNORMAL HIGH (ref 60–150)

## 2017-02-28 LAB — PROTEIN S, ANTIGEN, FREE: Protein S, Free: 67 % (ref 57–157)

## 2017-02-28 LAB — PROTEIN C ACTIVITY: Protein C-Functional: 164 % (ref 73–180)

## 2017-02-28 LAB — PROTEIN S ACTIVITY: Protein S-Functional: 63 % (ref 63–140)

## 2017-02-28 LAB — ANTITHROMBIN III ANTIGEN: AT III AG PPP IMM-ACNC: 96 % (ref 72–124)

## 2017-02-28 LAB — PROTEIN S, TOTAL: Protein S, Total: 85 % (ref 60–150)

## 2017-02-28 LAB — ANTITHROMBIN III: Antithrombin Activity: 104 % (ref 75–135)

## 2017-03-02 ENCOUNTER — Ambulatory Visit: Payer: BLUE CROSS/BLUE SHIELD | Admitting: Hematology and Oncology

## 2017-03-04 ENCOUNTER — Inpatient Hospital Stay (HOSPITAL_BASED_OUTPATIENT_CLINIC_OR_DEPARTMENT_OTHER): Payer: BLUE CROSS/BLUE SHIELD | Admitting: Hematology and Oncology

## 2017-03-04 ENCOUNTER — Other Ambulatory Visit: Payer: Self-pay

## 2017-03-04 ENCOUNTER — Encounter: Payer: Self-pay | Admitting: Hematology and Oncology

## 2017-03-04 ENCOUNTER — Telehealth: Payer: Self-pay | Admitting: Hematology and Oncology

## 2017-03-04 VITALS — BP 127/75 | HR 84 | Temp 98.7°F | Resp 18 | Ht 65.5 in | Wt 226.0 lb

## 2017-03-04 DIAGNOSIS — I2609 Other pulmonary embolism with acute cor pulmonale: Secondary | ICD-10-CM

## 2017-03-04 DIAGNOSIS — I82533 Chronic embolism and thrombosis of popliteal vein, bilateral: Secondary | ICD-10-CM

## 2017-03-04 DIAGNOSIS — Z7901 Long term (current) use of anticoagulants: Secondary | ICD-10-CM | POA: Diagnosis not present

## 2017-03-04 NOTE — Telephone Encounter (Signed)
Gave avs and calendar for January 2020 °

## 2017-03-08 ENCOUNTER — Inpatient Hospital Stay (HOSPITAL_COMMUNITY): Admission: RE | Admit: 2017-03-08 | Payer: BLUE CROSS/BLUE SHIELD | Source: Ambulatory Visit

## 2017-03-16 ENCOUNTER — Ambulatory Visit (HOSPITAL_COMMUNITY): Payer: BLUE CROSS/BLUE SHIELD

## 2017-03-16 ENCOUNTER — Encounter (HOSPITAL_COMMUNITY): Payer: Self-pay

## 2017-03-18 DIAGNOSIS — I82533 Chronic embolism and thrombosis of popliteal vein, bilateral: Secondary | ICD-10-CM | POA: Insufficient documentation

## 2017-03-18 NOTE — Assessment & Plan Note (Signed)
47 y.o. female with initial provoked venous thromboembolism in Jan 2015 due to concurrent oral contraceptive use and relative immobilization of the left lower extremity. Patient was appropriately treated with a six-month course of rivaroxaban (Xarelto) without complications. Patient has developed an apparently unprovoked clot at the end of Sep 2018 with right ventricular strain evidenced by CT of the chest as well as residual chronic clot in the right lower extremity.  Additional evaluation obtained prior to this visit demonstrates no evidence of protein C, protein S, or anti-thrombin deficiency.  Due to recurrent thrombosis and severity of the clots, I do recommend patient to remain on therapeutic anticoagulation indefinitely. Patient appears to be tolerating rivaroxaban without significant difficulties. She did not have a new breakthrough thrombosis during the therapeutic course of the medication. Patients cancer screening is up-to-datein regards to the breast and cervical cancer. As she is only 80, she is not due for a colonoscopy at this point in time, but considering the history of unprovoked clot, evaluation with Cologuard is warranted.  Plan: --Indefinite anticoagulation --Cologuard --Echocardiogram due to persistent symptoms of dyspnea with exertion --Return to the clinic in 1 year for clinical monitoring.
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
## 2017-03-25 ENCOUNTER — Telehealth: Payer: Self-pay | Admitting: Family Medicine

## 2017-03-25 NOTE — Telephone Encounter (Signed)
Please advise 

## 2017-03-25 NOTE — Telephone Encounter (Signed)
Copied from [place]. Topic: Quick Communication - See Telephone Encounter >> Mar 25, 2017  1:41 PM Percell Belt A wrote: CRM for notification. See Telephone encounter for:  pt called in and said that she is going on a cruise and would like some patches called in?  She would like to have them incase she needs them   [place]   03/25/17.

## 2017-03-29 ENCOUNTER — Other Ambulatory Visit: Payer: Self-pay | Admitting: Family Medicine

## 2017-03-29 MED ORDER — SCOPOLAMINE 1 MG/3DAYS TD PT72: 1.0000 | MEDICATED_PATCH | TRANSDERMAL | 0 refills | Status: DC

## 2017-03-29 NOTE — Telephone Encounter (Signed)
Left a VM for patient regarding patches being sent to patient pharmacy

## 2017-03-29 NOTE — Telephone Encounter (Signed)
Scopolamine patches sent to pt's pharmacy.

## 2017-06-04 ENCOUNTER — Encounter (HOSPITAL_COMMUNITY): Payer: Self-pay | Admitting: Emergency Medicine

## 2017-06-04 ENCOUNTER — Emergency Department (HOSPITAL_COMMUNITY): Payer: BLUE CROSS/BLUE SHIELD

## 2017-06-04 ENCOUNTER — Inpatient Hospital Stay (HOSPITAL_COMMUNITY)
Admission: EM | Admit: 2017-06-04 | Discharge: 2017-06-06 | DRG: 661 | Disposition: A | Discharge status: Home / Self Care | Payer: BLUE CROSS/BLUE SHIELD | Attending: Internal Medicine | Admitting: Internal Medicine

## 2017-06-04 ENCOUNTER — Other Ambulatory Visit: Payer: Self-pay

## 2017-06-04 DIAGNOSIS — N200 Calculus of kidney: Secondary | ICD-10-CM | POA: Diagnosis not present

## 2017-06-04 DIAGNOSIS — T8384XA Pain from genitourinary prosthetic devices, implants and grafts, initial encounter: Secondary | ICD-10-CM | POA: Diagnosis not present

## 2017-06-04 DIAGNOSIS — N132 Hydronephrosis with renal and ureteral calculous obstruction: Principal | ICD-10-CM | POA: Diagnosis present

## 2017-06-04 DIAGNOSIS — Z7901 Long term (current) use of anticoagulants: Secondary | ICD-10-CM

## 2017-06-04 DIAGNOSIS — N179 Acute kidney failure, unspecified: Secondary | ICD-10-CM | POA: Diagnosis not present

## 2017-06-04 DIAGNOSIS — Z885 Allergy status to narcotic agent status: Secondary | ICD-10-CM

## 2017-06-04 DIAGNOSIS — Z86718 Personal history of other venous thrombosis and embolism: Secondary | ICD-10-CM

## 2017-06-04 DIAGNOSIS — E860 Dehydration: Secondary | ICD-10-CM | POA: Diagnosis present

## 2017-06-04 DIAGNOSIS — N201 Calculus of ureter: Secondary | ICD-10-CM | POA: Diagnosis not present

## 2017-06-04 DIAGNOSIS — Z86711 Personal history of pulmonary embolism: Secondary | ICD-10-CM | POA: Diagnosis not present

## 2017-06-04 DIAGNOSIS — N870 Mild cervical dysplasia: Secondary | ICD-10-CM | POA: Diagnosis not present

## 2017-06-04 DIAGNOSIS — R1031 Right lower quadrant pain: Secondary | ICD-10-CM | POA: Diagnosis not present

## 2017-06-04 DIAGNOSIS — N135 Crossing vessel and stricture of ureter without hydronephrosis: Secondary | ICD-10-CM | POA: Diagnosis not present

## 2017-06-04 DIAGNOSIS — R112 Nausea with vomiting, unspecified: Secondary | ICD-10-CM | POA: Diagnosis not present

## 2017-06-04 LAB — LIPASE, BLOOD: Lipase: 19 U/L (ref 11–51)

## 2017-06-04 LAB — COMPREHENSIVE METABOLIC PANEL
ALT: 14 U/L (ref 14–54)
AST: 18 U/L (ref 15–41)
Albumin: 3.8 g/dL (ref 3.5–5.0)
Alkaline Phosphatase: 84 U/L (ref 38–126)
Anion gap: 12 (ref 5–15)
BUN: 21 mg/dL — ABNORMAL HIGH (ref 6–20)
CO2: 22 mmol/L (ref 22–32)
Calcium: 9.4 mg/dL (ref 8.9–10.3)
Chloride: 106 mmol/L (ref 101–111)
Creatinine, Ser: 2.13 mg/dL — ABNORMAL HIGH (ref 0.44–1.00)
GFR calc Af Amer: 31 mL/min — ABNORMAL LOW (ref >60)
GFR calc non Af Amer: 26 mL/min — ABNORMAL LOW (ref >60)
Glucose, Bld: 153 mg/dL — ABNORMAL HIGH (ref 65–99)
Potassium: 4.1 mmol/L (ref 3.5–5.1)
Sodium: 140 mmol/L (ref 135–145)
Total Bilirubin: 0.5 mg/dL (ref 0.3–1.2)
Total Protein: 8.2 g/dL — ABNORMAL HIGH (ref 6.5–8.1)

## 2017-06-04 LAB — URINALYSIS, ROUTINE W REFLEX MICROSCOPIC
Bilirubin Urine: NEGATIVE
Glucose, UA: NEGATIVE mg/dL
Ketones, ur: NEGATIVE mg/dL
Leukocytes, UA: NEGATIVE
Nitrite: NEGATIVE
Protein, ur: NEGATIVE mg/dL
Specific Gravity, Urine: 1.01 (ref 1.005–1.030)
pH: 6 (ref 5.0–8.0)

## 2017-06-04 LAB — CBC WITH DIFFERENTIAL/PLATELET
Basophils Absolute: 0 10*3/uL (ref 0.0–0.1)
Basophils Relative: 0 %
Eosinophils Absolute: 0 10*3/uL (ref 0.0–0.7)
Eosinophils Relative: 0 %
HCT: 29.1 % — ABNORMAL LOW (ref 36.0–46.0)
Hemoglobin: 8.6 g/dL — ABNORMAL LOW (ref 12.0–15.0)
Lymphocytes Relative: 7 %
Lymphs Abs: 0.6 10*3/uL — ABNORMAL LOW (ref 0.7–4.0)
MCH: 24.5 pg — ABNORMAL LOW (ref 26.0–34.0)
MCHC: 29.6 g/dL — ABNORMAL LOW (ref 30.0–36.0)
MCV: 82.9 fL (ref 78.0–100.0)
Monocytes Absolute: 0.5 10*3/uL (ref 0.1–1.0)
Monocytes Relative: 6 %
Neutro Abs: 7.9 10*3/uL — ABNORMAL HIGH (ref 1.7–7.7)
Neutrophils Relative %: 87 %
Platelets: 402 10*3/uL — ABNORMAL HIGH (ref 150–400)
RBC: 3.51 MIL/uL — ABNORMAL LOW (ref 3.87–5.11)
RDW: 16.4 % — ABNORMAL HIGH (ref 11.5–15.5)
WBC: 9 10*3/uL (ref 4.0–10.5)

## 2017-06-04 LAB — I-STAT BETA HCG BLOOD, ED (MC, WL, AP ONLY): I-stat hCG, quantitative: <5 m[IU]/mL (ref <5)

## 2017-06-04 LAB — BASIC METABOLIC PANEL
Anion gap: 9 (ref 5–15)
BUN: 21 mg/dL — ABNORMAL HIGH (ref 6–20)
CO2: 21 mmol/L — ABNORMAL LOW (ref 22–32)
Calcium: 8.5 mg/dL — ABNORMAL LOW (ref 8.9–10.3)
Chloride: 111 mmol/L (ref 101–111)
Creatinine, Ser: 2 mg/dL — ABNORMAL HIGH (ref 0.44–1.00)
GFR calc Af Amer: 33 mL/min — ABNORMAL LOW (ref >60)
GFR calc non Af Amer: 29 mL/min — ABNORMAL LOW (ref >60)
Glucose, Bld: 133 mg/dL — ABNORMAL HIGH (ref 65–99)
Potassium: 4.2 mmol/L (ref 3.5–5.1)
Sodium: 141 mmol/L (ref 135–145)

## 2017-06-04 MED ORDER — RIVAROXABAN 20 MG PO TABS
20.0000 mg | ORAL_TABLET | Freq: Every day | ORAL | Status: DC
  Administered 2017-06-04: 20 mg via ORAL
  Filled 2017-06-04: qty 1

## 2017-06-04 MED ORDER — SODIUM CHLORIDE 0.9 % IV BOLUS
1000.0000 mL | Freq: Once | INTRAVENOUS | Status: AC
  Administered 2017-06-04: 1000 mL via INTRAVENOUS

## 2017-06-04 MED ORDER — HYDROMORPHONE HCL 1 MG/ML IJ SOLN
1.0000 mg | INTRAMUSCULAR | Status: DC | PRN
  Administered 2017-06-04 – 2017-06-05 (×3): 1 mg via INTRAVENOUS
  Filled 2017-06-04 (×3): qty 1

## 2017-06-04 MED ORDER — OXYCODONE HCL 5 MG PO TABS
5.0000 mg | ORAL_TABLET | ORAL | Status: DC | PRN
  Administered 2017-06-05 – 2017-06-06 (×2): 5 mg via ORAL
  Filled 2017-06-04 (×2): qty 1

## 2017-06-04 MED ORDER — ONDANSETRON HCL 4 MG/2ML IJ SOLN: 4.0000 mg | Freq: Four times a day (QID) | INTRAMUSCULAR | Status: DC | PRN

## 2017-06-04 MED ORDER — LACTATED RINGERS IV SOLN
INTRAVENOUS | Status: AC
  Administered 2017-06-04 – 2017-06-05 (×2): via INTRAVENOUS

## 2017-06-04 MED ORDER — ACETAMINOPHEN 325 MG PO TABS: 650.0000 mg | ORAL_TABLET | Freq: Four times a day (QID) | ORAL | Status: DC | PRN

## 2017-06-04 MED ORDER — MORPHINE SULFATE (PF) 2 MG/ML IV SOLN
2.0000 mg | Freq: Once | INTRAVENOUS | Status: AC
  Administered 2017-06-04: 2 mg via INTRAVENOUS
  Filled 2017-06-04: qty 1

## 2017-06-04 MED ORDER — IOPAMIDOL (ISOVUE-300) INJECTION 61%
30.0000 mL | Freq: Once | INTRAVENOUS | Status: AC | PRN
  Administered 2017-06-04: 30 mL via ORAL

## 2017-06-04 MED ORDER — ONDANSETRON HCL 4 MG/2ML IJ SOLN
4.0000 mg | Freq: Once | INTRAMUSCULAR | Status: AC
Start: 2017-06-04 — End: 2017-06-04
  Administered 2017-06-04: 4 mg via INTRAVENOUS
  Filled 2017-06-04: qty 2

## 2017-06-04 MED ORDER — TAMSULOSIN HCL 0.4 MG PO CAPS
0.4000 mg | ORAL_CAPSULE | Freq: Every day | ORAL | Status: DC
  Administered 2017-06-04 – 2017-06-06 (×3): 0.4 mg via ORAL
  Filled 2017-06-04 (×3): qty 1

## 2017-06-04 MED ORDER — IOPAMIDOL (ISOVUE-300) INJECTION 61%
INTRAVENOUS | Status: AC
  Filled 2017-06-04: qty 30

## 2017-06-04 MED ORDER — ONDANSETRON HCL 4 MG PO TABS: 4.0000 mg | ORAL_TABLET | Freq: Four times a day (QID) | ORAL | Status: DC | PRN

## 2017-06-04 MED ORDER — ACETAMINOPHEN 650 MG RE SUPP: 650.0000 mg | Freq: Four times a day (QID) | RECTAL | Status: DC | PRN
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
## 2017-06-04 NOTE — ED Notes (Signed)
Attempted IV access x 1, unsuccessful. Manuela Schwartz, RN attempting ultrasound IV at this time.
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
## 2017-06-04 NOTE — ED Notes (Signed)
Patient transported to CT 

## 2017-06-04 NOTE — ED Provider Notes (Signed)
4:43 PM BP 125/62   Pulse 86   Temp 99.8 F (37.7 C) (Oral)   Resp 16   Ht 5\' 5"  (1.651 m)   Wt 100.2 kg (221 lb)   SpO2 100%   BMI 36.78 kg/m   assumed care of the Patient from [place]. Patient has obstructive stone and will be admitted by the hospitalist service. Urine is pending.;  Spoke with Dr. Junious Silk who will consult on the patient.  Patient admitted by the hospitalist   Margarita Mail, PA-C 06/05/17 0017    Tanna Furry, MD 06/07/17 [id number]
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
## 2017-06-04 NOTE — ED Triage Notes (Signed)
Patient c/o RLQ abdominal pain onset of Thursday. Patient states felt slightly better yesterday but worsened after eating. States also having emesis. Denies any urinary issues.

## 2017-06-05 ENCOUNTER — Encounter (HOSPITAL_COMMUNITY)
Admission: EM | Disposition: A | Discharge status: Home / Self Care | Payer: Self-pay | Source: Home / Self Care | Attending: Internal Medicine

## 2017-06-05 ENCOUNTER — Observation Stay (HOSPITAL_COMMUNITY): Payer: BLUE CROSS/BLUE SHIELD

## 2017-06-05 ENCOUNTER — Observation Stay (HOSPITAL_COMMUNITY): Payer: BLUE CROSS/BLUE SHIELD | Admitting: Anesthesiology

## 2017-06-05 DIAGNOSIS — N179 Acute kidney failure, unspecified: Secondary | ICD-10-CM | POA: Diagnosis not present

## 2017-06-05 DIAGNOSIS — Z7901 Long term (current) use of anticoagulants: Secondary | ICD-10-CM | POA: Diagnosis not present

## 2017-06-05 DIAGNOSIS — N201 Calculus of ureter: Secondary | ICD-10-CM | POA: Diagnosis not present

## 2017-06-05 DIAGNOSIS — Z86711 Personal history of pulmonary embolism: Secondary | ICD-10-CM | POA: Diagnosis not present

## 2017-06-05 DIAGNOSIS — Z885 Allergy status to narcotic agent status: Secondary | ICD-10-CM | POA: Diagnosis not present

## 2017-06-05 DIAGNOSIS — N132 Hydronephrosis with renal and ureteral calculous obstruction: Secondary | ICD-10-CM | POA: Diagnosis present

## 2017-06-05 DIAGNOSIS — E860 Dehydration: Secondary | ICD-10-CM | POA: Diagnosis present

## 2017-06-05 DIAGNOSIS — Z86718 Personal history of other venous thrombosis and embolism: Secondary | ICD-10-CM | POA: Diagnosis not present

## 2017-06-05 DIAGNOSIS — N135 Crossing vessel and stricture of ureter without hydronephrosis: Secondary | ICD-10-CM | POA: Diagnosis not present

## 2017-06-05 DIAGNOSIS — N870 Mild cervical dysplasia: Secondary | ICD-10-CM | POA: Diagnosis not present

## 2017-06-05 DIAGNOSIS — T8384XA Pain from genitourinary prosthetic devices, implants and grafts, initial encounter: Secondary | ICD-10-CM | POA: Diagnosis not present

## 2017-06-05 HISTORY — PX: CYSTOSCOPY/RETROGRADE/URETEROSCOPY/STONE EXTRACTION WITH BASKET: SHX5317

## 2017-06-05 LAB — COMPREHENSIVE METABOLIC PANEL
ALT: 17 U/L (ref 14–54)
AST: 19 U/L (ref 15–41)
Albumin: 3.4 g/dL — ABNORMAL LOW (ref 3.5–5.0)
Alkaline Phosphatase: 74 U/L (ref 38–126)
Anion gap: 9 (ref 5–15)
BUN: 21 mg/dL — ABNORMAL HIGH (ref 6–20)
CO2: 21 mmol/L — ABNORMAL LOW (ref 22–32)
Calcium: 8.6 mg/dL — ABNORMAL LOW (ref 8.9–10.3)
Chloride: 108 mmol/L (ref 101–111)
Creatinine, Ser: 2.18 mg/dL — ABNORMAL HIGH (ref 0.44–1.00)
GFR calc Af Amer: 30 mL/min — ABNORMAL LOW (ref >60)
GFR calc non Af Amer: 26 mL/min — ABNORMAL LOW (ref >60)
Glucose, Bld: 149 mg/dL — ABNORMAL HIGH (ref 65–99)
Potassium: 4.1 mmol/L (ref 3.5–5.1)
Sodium: 138 mmol/L (ref 135–145)
Total Bilirubin: 0.6 mg/dL (ref 0.3–1.2)
Total Protein: 6.9 g/dL (ref 6.5–8.1)

## 2017-06-05 LAB — CBC
HCT: 26.6 % — ABNORMAL LOW (ref 36.0–46.0)
Hemoglobin: 8 g/dL — ABNORMAL LOW (ref 12.0–15.0)
MCH: 25.3 pg — ABNORMAL LOW (ref 26.0–34.0)
MCHC: 30.1 g/dL (ref 30.0–36.0)
MCV: 84.2 fL (ref 78.0–100.0)
Platelets: 325 10*3/uL (ref 150–400)
RBC: 3.16 MIL/uL — ABNORMAL LOW (ref 3.87–5.11)
RDW: 16.5 % — ABNORMAL HIGH (ref 11.5–15.5)
WBC: 14 10*3/uL — ABNORMAL HIGH (ref 4.0–10.5)

## 2017-06-05 LAB — PROTIME-INR
INR: 1.53
Prothrombin Time: 18.2 seconds — ABNORMAL HIGH (ref 11.4–15.2)

## 2017-06-05 LAB — APTT: aPTT: 49 seconds — ABNORMAL HIGH (ref 24–36)

## 2017-06-05 SURGERY — CYSTOSCOPY, WITH CALCULUS REMOVAL USING BASKET
Anesthesia: General | Site: Ureter | Laterality: Right

## 2017-06-05 MED ORDER — SODIUM CHLORIDE 0.9 % IR SOLN
Status: DC | PRN
  Administered 2017-06-05 (×2): 3000 mL via INTRAVESICAL

## 2017-06-05 MED ORDER — EPHEDRINE 5 MG/ML INJ
INTRAVENOUS | Status: AC
  Filled 2017-06-05: qty 10

## 2017-06-05 MED ORDER — FENTANYL CITRATE (PF) 100 MCG/2ML IJ SOLN: 25.0000 ug | INTRAMUSCULAR | Status: DC | PRN

## 2017-06-05 MED ORDER — DEXAMETHASONE SODIUM PHOSPHATE 10 MG/ML IJ SOLN
INTRAMUSCULAR | Status: AC
  Filled 2017-06-05: qty 1

## 2017-06-05 MED ORDER — FENTANYL CITRATE (PF) 100 MCG/2ML IJ SOLN
INTRAMUSCULAR | Status: AC
  Filled 2017-06-05: qty 2

## 2017-06-05 MED ORDER — SODIUM CHLORIDE 0.9 % IV SOLN
INTRAVENOUS | Status: DC | PRN
  Administered 2017-06-05: 3 mL

## 2017-06-05 MED ORDER — PHENYLEPHRINE 40 MCG/ML (10ML) SYRINGE FOR IV PUSH (FOR BLOOD PRESSURE SUPPORT)
PREFILLED_SYRINGE | INTRAVENOUS | Status: DC | PRN
  Administered 2017-06-05 (×3): 80 ug via INTRAVENOUS

## 2017-06-05 MED ORDER — LACTATED RINGERS IV SOLN: INTRAVENOUS | Status: DC

## 2017-06-05 MED ORDER — LIDOCAINE 2% (20 MG/ML) 5 ML SYRINGE
INTRAMUSCULAR | Status: DC | PRN
  Administered 2017-06-05: 50 mg via INTRAVENOUS

## 2017-06-05 MED ORDER — FENTANYL CITRATE (PF) 100 MCG/2ML IJ SOLN
INTRAMUSCULAR | Status: DC | PRN
  Administered 2017-06-05: 100 ug via INTRAVENOUS

## 2017-06-05 MED ORDER — PROPOFOL 10 MG/ML IV BOLUS
INTRAVENOUS | Status: DC | PRN
  Administered 2017-06-05: 150 mg via INTRAVENOUS

## 2017-06-05 MED ORDER — ROCURONIUM BROMIDE 10 MG/ML (PF) SYRINGE
PREFILLED_SYRINGE | INTRAVENOUS | Status: AC
  Filled 2017-06-05: qty 5

## 2017-06-05 MED ORDER — PHENYLEPHRINE HCL 10 MG/ML IJ SOLN
INTRAVENOUS | Status: DC | PRN
  Administered 2017-06-05: 80 ug/min via INTRAVENOUS

## 2017-06-05 MED ORDER — SUCCINYLCHOLINE CHLORIDE 200 MG/10ML IV SOSY
PREFILLED_SYRINGE | INTRAVENOUS | Status: AC
  Filled 2017-06-05: qty 10

## 2017-06-05 MED ORDER — LIDOCAINE 2% (20 MG/ML) 5 ML SYRINGE
INTRAMUSCULAR | Status: AC
  Filled 2017-06-05: qty 5

## 2017-06-05 MED ORDER — CEFAZOLIN SODIUM-DEXTROSE 2-4 GM/100ML-% IV SOLN
INTRAVENOUS | Status: AC
  Filled 2017-06-05: qty 100

## 2017-06-05 MED ORDER — SUCCINYLCHOLINE CHLORIDE 200 MG/10ML IV SOSY
PREFILLED_SYRINGE | INTRAVENOUS | Status: DC | PRN
  Administered 2017-06-05: 120 mg via INTRAVENOUS

## 2017-06-05 MED ORDER — CEFAZOLIN SODIUM-DEXTROSE 2-4 GM/100ML-% IV SOLN
2.0000 g | INTRAVENOUS | Status: AC
  Administered 2017-06-05: 2 g via INTRAVENOUS

## 2017-06-05 MED ORDER — LACTATED RINGERS IV SOLN
INTRAVENOUS | Status: DC | PRN
  Administered 2017-06-05: 11:00:00 via INTRAVENOUS

## 2017-06-05 MED ORDER — PROPOFOL 10 MG/ML IV BOLUS
INTRAVENOUS | Status: AC
  Filled 2017-06-05: qty 20

## 2017-06-05 MED ORDER — PHENYLEPHRINE 40 MCG/ML (10ML) SYRINGE FOR IV PUSH (FOR BLOOD PRESSURE SUPPORT)
PREFILLED_SYRINGE | INTRAVENOUS | Status: AC
  Filled 2017-06-05: qty 10

## 2017-06-05 MED ORDER — MIDAZOLAM HCL 2 MG/2ML IJ SOLN
INTRAMUSCULAR | Status: AC
  Filled 2017-06-05: qty 2

## 2017-06-05 MED ORDER — MIDAZOLAM HCL 5 MG/5ML IJ SOLN
INTRAMUSCULAR | Status: DC | PRN
  Administered 2017-06-05: 2 mg via INTRAVENOUS

## 2017-06-05 MED ORDER — ONDANSETRON HCL 4 MG/2ML IJ SOLN
INTRAMUSCULAR | Status: AC
  Filled 2017-06-05: qty 2

## 2017-06-05 SURGICAL SUPPLY — 25 items
BAG URO CATCHER STRL LF (MISCELLANEOUS) ×3 IMPLANT
BASKET STONE 1.7 NGAGE (UROLOGICAL SUPPLIES) ×3 IMPLANT
BASKET ZERO TIP NITINOL 2.4FR (BASKET) ×3 IMPLANT
CATH INTERMIT  6FR 70CM (CATHETERS) ×3 IMPLANT
CATH URET 5FR 28IN CONE TIP (BALLOONS)
CATH URET 5FR 70CM CONE TIP (BALLOONS) IMPLANT
CATH URET WHISTLE 6FR (CATHETERS) IMPLANT
CLOTH BEACON ORANGE TIMEOUT ST (SAFETY) ×3 IMPLANT
COVER FOOTSWITCH UNIV (MISCELLANEOUS) IMPLANT
COVER SURGICAL LIGHT HANDLE (MISCELLANEOUS) ×3 IMPLANT
FIBER LASER FLEXIVA 1000 (UROLOGICAL SUPPLIES) IMPLANT
FIBER LASER FLEXIVA 365 (UROLOGICAL SUPPLIES) IMPLANT
FIBER LASER FLEXIVA 550 (UROLOGICAL SUPPLIES) IMPLANT
FIBER LASER TRAC TIP (UROLOGICAL SUPPLIES) ×3 IMPLANT
GLOVE BIO SURGEON STRL SZ7.5 (GLOVE) ×3 IMPLANT
GOWN STRL REUS W/TWL XL LVL3 (GOWN DISPOSABLE) ×3 IMPLANT
GUIDEWIRE STR DUAL SENSOR (WIRE) ×3 IMPLANT
MANIFOLD NEPTUNE II (INSTRUMENTS) ×3 IMPLANT
PACK CYSTO (CUSTOM PROCEDURE TRAY) ×3 IMPLANT
SHEATH ACCESS URETERAL 24CM (SHEATH) IMPLANT
SHEATH URETERAL 12FRX35CM (MISCELLANEOUS) IMPLANT
STENT CONTOUR 6FRX26X.038 (STENTS) ×3 IMPLANT
TRAY FOLEY MTR SLVR 14FR STAT (SET/KITS/TRAYS/PACK) ×3 IMPLANT
TUBING CONNECTING 10 (TUBING) ×3 IMPLANT
WIRE COONS/BENSON .038X145CM (WIRE) IMPLANT

## 2017-06-05 NOTE — Anesthesia Postprocedure Evaluation (Signed)
Anesthesia Post Note  Patient: Marissa [name]  Procedure(s) Performed: CYSTOSCOPY/RIGHT RETROGRADE PYELOGRAM/RIGHT URETEROSCOPY/STONE EXTRACTION WITH BASKET/HOLMIUM LASER LITHOTRIPSY/RIGHT URETERAL STENT PLACEMENT (Right Ureter)     Patient location during evaluation: PACU Anesthesia Type: General Level of consciousness: awake Pain management: pain level controlled Vital Signs Assessment: post-procedure vital signs reviewed and stable Respiratory status: spontaneous breathing Cardiovascular status: stable Anesthetic complications: no    Last Vitals:  Vitals:   06/05/17 1300 06/05/17 1315  BP: 126/78 121/73  Pulse: (!) 107 (!) 111  Resp: 17 20  Temp: 37.1 C   SpO2: 99% (!) 88%    Last Pain:  Vitals:   06/05/17 1315  TempSrc:   PainSc: 0-No pain                 Kiaya Haliburton

## 2017-06-05 NOTE — Progress Notes (Signed)
Day of Surgery Subjective: Patient reports RLQ pain.  Nurse reports urine was strained and no stone passage.  Patient's creatinine and white count have gone up.  Objective: Vital signs in last 24 hours: Temp:  [98.4 F (36.9 C)-99.8 F (37.7 C)] 98.4 F (36.9 C) (04/14 0523) Pulse Rate:  [83-94] 92 (04/14 0523) Resp:  [16-20] 16 (04/14 0523) BP: (102-148)/(60-81) 116/74 (04/14 0523) SpO2:  [97 %-100 %] 100 % (04/14 0523) Weight:  [97.4 kg (214 lb 11.7 oz)-100.2 kg (221 lb)] 97.4 kg (214 lb 11.7 oz) (04/14 0522)  Intake/Output from previous day: 04/13 0701 - 04/14 0700 In: 3354.2 [I.V.:1354.2; IV Piggyback:2000] Out: 845 [Urine:845] Intake/Output this shift: No intake/output data recorded.  Physical Exam:  She looks a little more feeble this morning  NAD No neuro focal deficits  CV - RRR Resp - reg effort and depth  Abd - soft, NT Ext - no CCE  Lab Results: Recent Labs    06/04/17 1209 06/05/17 0431  HGB 8.6* 8.0*  HCT 29.1* 26.6*   BMET Recent Labs    06/04/17 1513 06/05/17 0431  NA 141 138  K 4.2 4.1  CL 111 108  CO2 21* 21*  GLUCOSE 133* 149*  BUN 21* 21*  CREATININE 2.00* 2.18*  CALCIUM 8.5* 8.6*   Recent Labs    06/05/17 0431  INR 1.53   No results for input(s): LABURIN in the last 72 hours. Results for orders placed or performed in visit on 08/21/15  Urine culture     Status: None   Collection Time: 08/21/15 11:03 AM  Result Value Ref Range Status   Colony Count 8,000 COLONIES/ML  Final   Organism ID, Bacteria Insignificant Growth  Final    Studies/Results: Ct Abdomen Pelvis Wo Contrast  Result Date: 06/04/2017 CLINICAL DATA:  RIGHT lower quadrant abdominal pain beginning Thursday, abdominal infection question peritonitis EXAM: CT ABDOMEN AND PELVIS WITHOUT CONTRAST TECHNIQUE: Multi detector helical CT imaging of the abdomen and pelvis was performed. IV contrast was not utilized due to renal dysfunction. Patient drank dilute oral contrast  for exam. Sagittal and coronal MPR images reconstructed from axial data set. COMPARISON:  None FINDINGS: Lower chest: Mild RIGHT basilar atelectasis Hepatobiliary: Gallbladder surgically absent.  Liver unremarkable. Pancreas: Normal appearance Spleen: Normal appearance Adrenals/Urinary Tract: Adrenal glands unremarkable. Tiny nonobstructing BILATERAL renal calculi. Cortical scarring LEFT kidney without dominant mass or hydronephrosis. Small cyst at upper pole LEFT kidney 2.2 cm diameter. Marked infiltrative changes of the perinephric space including edema along anterior posterior pararenal fascia. RIGHT hydronephrosis and hydroureter terminating at a 4 mm distal RIGHT ureteral calculus near the ureterovesical junction. Probable cyst at inferior pole RIGHT kidney 3.1 cm diameter. Stomach/Bowel: Mobile cecum located in mid abdomen. Normal appendix in mid abdomen. Perigastric surgical clips. Stomach and bowel loops otherwise unremarkable. Vascular/Lymphatic: Unremarkable Reproductive: Normal appearance of uterus and adnexa Other: No free air or free fluid.  No hernia. Musculoskeletal: Osseous structures unremarkable IMPRESSION: RIGHT hydronephrosis and hydroureter secondary to a 4 mm distal RIGHT ureteral calculus. Marked RIGHT perinephric edema. Additional tiny BILATERAL nonobstructing renal calculi and small renal cysts. Electronically Signed   By: Lavonia Dana M.D.   On: 06/04/2017 15:35    Assessment/Plan: Right distal stone, AKI - evidence of hydronephrosis and forniceal rupture on CT.  Her kidney function is worse this morning with rising creatinine.  She has not passed a stone.  Her white count is up.  She continues to have some pain.  Again we discussed  the nature risk benefits and alternatives to cystoscopy with right retrograde pyelogram, right ureteral stent, right ureteroscopy and possible lithotripsy.  Discussed again she may need a staged procedure.  I went over the procedure again on the anatomical  diagram I drew on  the dry erase board.  We talked about cardiovascular risk among others as well.  She elects to proceed.   LOS: 0 days   Festus Aloe 06/05/2017, 10:02 AM

## 2017-06-05 NOTE — Progress Notes (Signed)
PROGRESS NOTE    Marissa [name]  [name]:[id number] DOB: 05-May-1970 DOA: 06/04/2017 PCP: [name], MD     Brief Narrative:  Marissa [name] is a 47 y.o. female with medical history significant of PE on Xarelto presenting with abdominal pain, nausea, and vomiting with acute kidney injury due to a kidney stone. Patient notes that she started having right-sided abdominal pain on Thursday.  She states that it stopped yesterday, but returned last night.  She describes the pain as achy, constant.  Nothing makes it better.  Movement makes it worse.  She notes the decreased appetite.  She is vomited about 6 times. CT showed right hydronephrosis and hydroureter secondary to 4 mm right ureteral stone . Urology was consulted.   Assessment & Plan:   Active Problems:   Acute kidney injury (HCC)  AKI secondary to right hydronephrosis and hydroureter secondary to 4 mm right ureteral stone -Urology consulted, plan for OR today for cystoscopy with right retrograde pyelogram, right ureteral stent, right ureteroscopy, possible lithotripsy -IV fluids -Trend BMP  History of PE -Continue Xarelto   DVT prophylaxis: Xarelto Code Status: Full Family Communication: Family at bedside Disposition Plan: Pending OR and postop course   Consultants:   Urology  Procedures:   None  Antimicrobials:  Anti-infectives (From admission, onward)   Start     Dose/Rate Route Frequency Ordered Stop   06/05/17 1145  [MAR Hold]  ceFAZolin (ANCEF) IVPB 2g/100 mL premix     (MAR Hold since Sun 06/05/2017 at 1145. Reason: Transfer to a Procedural area.)   2 g 200 mL/hr over 30 Minutes Intravenous On call to O.R. 06/05/17 1001 06/05/17 1140   06/05/17 1041  ceFAZolin (ANCEF) 2-4 GM/100ML-% IVPB    Note to Pharmacy:  Delena Bali   : cabinet override      06/05/17 1041 06/05/17 1140       Subjective: Continues to have pain on her right side, improved with IV pain medications.  Has not passed her kidney  stone overnight  Objective: Vitals:   06/04/17 1900 06/04/17 1910 06/05/17 0522 06/05/17 0523  BP:  123/69  116/74  Pulse:  91  92  Resp:  16  16  Temp:  99.4 F (37.4 C)  98.4 F (36.9 C)  TempSrc:  Oral  Oral  SpO2:  100%  100%  Weight: 98.3 kg (216 lb 11.4 oz)  97.4 kg (214 lb 11.7 oz)   Height: 5\' 5"  (1.651 m)       Intake/Output Summary (Last 24 hours) at 06/05/2017 1238 Last data filed at 06/05/2017 1232 Gross per 24 hour  Intake 4354.17 ml  Output 945 ml  Net 3409.17 ml   Filed Weights   06/04/17 1158 06/04/17 1900 06/05/17 0522  Weight: 100.2 kg (221 lb) 98.3 kg (216 lb 11.4 oz) 97.4 kg (214 lb 11.7 oz)    Examination:  General exam: Appears calm and comfortable  Respiratory system: Clear to auscultation. Respiratory effort normal. Cardiovascular system: S1 & S2 heard, RRR. No JVD, murmurs, rubs, gallops or clicks. No pedal edema. Gastrointestinal system: Abdomen is nondistended, soft and nontender. No organomegaly or masses felt. Normal bowel sounds heard. Central nervous system: Alert and oriented. No focal neurological deficits. Extremities: Symmetric 5 x 5 power. Skin: No rashes, lesions or ulcers Psychiatry: Judgement and insight appear normal. Mood & affect appropriate.   Data Reviewed: I have personally reviewed following labs and imaging studies  CBC: Recent Labs  Lab 06/04/17 1209 06/05/17 0431  WBC 9.0 14.0*  NEUTROABS 7.9*  --   HGB 8.6* 8.0*  HCT 29.1* 26.6*  MCV 82.9 84.2  PLT 402* 932   Basic Metabolic Panel: Recent Labs  Lab 06/04/17 1209 06/04/17 1513 06/05/17 0431  NA 140 141 138  K 4.1 4.2 4.1  CL 106 111 108  CO2 22 21* 21*  GLUCOSE 153* 133* 149*  BUN 21* 21* 21*  CREATININE 2.13* 2.00* 2.18*  CALCIUM 9.4 8.5* 8.6*   GFR: Estimated Creatinine Clearance: 36.9 mL/min (A) (by C-G formula based on SCr of 2.18 mg/dL (H)). Liver Function Tests: Recent Labs  Lab 06/04/17 1209 06/05/17 0431  AST 18 19  ALT 14 17  ALKPHOS  84 74  BILITOT 0.5 0.6  PROT 8.2* 6.9  ALBUMIN 3.8 3.4*   Recent Labs  Lab 06/04/17 1209  LIPASE 19   No results for input(s): AMMONIA in the last 168 hours. Coagulation Profile: Recent Labs  Lab 06/05/17 0431  INR 1.53   Cardiac Enzymes: No results for input(s): CKTOTAL, CKMB, CKMBINDEX, TROPONINI in the last 168 hours. BNP (last 3 results) No results for input(s): PROBNP in the last 8760 hours. HbA1C: No results for input(s): HGBA1C in the last 72 hours. CBG: No results for input(s): GLUCAP in the last 168 hours. Lipid Profile: No results for input(s): CHOL, HDL, LDLCALC, TRIG, CHOLHDL, LDLDIRECT in the last 72 hours. Thyroid Function Tests: No results for input(s): TSH, T4TOTAL, FREET4, T3FREE, THYROIDAB in the last 72 hours. Anemia Panel: No results for input(s): VITAMINB12, FOLATE, FERRITIN, TIBC, IRON, RETICCTPCT in the last 72 hours. Sepsis Labs: No results for input(s): PROCALCITON, LATICACIDVEN in the last 168 hours.  No results found for this or any previous visit (from the past 240 hour(s)).     Radiology Studies: Ct Abdomen Pelvis Wo Contrast  Result Date: 06/04/2017 CLINICAL DATA:  RIGHT lower quadrant abdominal pain beginning Thursday, abdominal infection question peritonitis EXAM: CT ABDOMEN AND PELVIS WITHOUT CONTRAST TECHNIQUE: Multi detector helical CT imaging of the abdomen and pelvis was performed. IV contrast was not utilized due to renal dysfunction. Patient drank dilute oral contrast for exam. Sagittal and coronal MPR images reconstructed from axial data set. COMPARISON:  None FINDINGS: Lower chest: Mild RIGHT basilar atelectasis Hepatobiliary: Gallbladder surgically absent.  Liver unremarkable. Pancreas: Normal appearance Spleen: Normal appearance Adrenals/Urinary Tract: Adrenal glands unremarkable. Tiny nonobstructing BILATERAL renal calculi. Cortical scarring LEFT kidney without dominant mass or hydronephrosis. Small cyst at upper pole LEFT kidney  2.2 cm diameter. Marked infiltrative changes of the perinephric space including edema along anterior posterior pararenal fascia. RIGHT hydronephrosis and hydroureter terminating at a 4 mm distal RIGHT ureteral calculus near the ureterovesical junction. Probable cyst at inferior pole RIGHT kidney 3.1 cm diameter. Stomach/Bowel: Mobile cecum located in mid abdomen. Normal appendix in mid abdomen. Perigastric surgical clips. Stomach and bowel loops otherwise unremarkable. Vascular/Lymphatic: Unremarkable Reproductive: Normal appearance of uterus and adnexa Other: No free air or free fluid.  No hernia. Musculoskeletal: Osseous structures unremarkable IMPRESSION: RIGHT hydronephrosis and hydroureter secondary to a 4 mm distal RIGHT ureteral calculus. Marked RIGHT perinephric edema. Additional tiny BILATERAL nonobstructing renal calculi and small renal cysts. Electronically Signed   By: Lavonia Dana M.D.   On: 06/04/2017 15:35      Scheduled Meds: . [MAR Hold] rivaroxaban  20 mg Oral Q supper  . [MAR Hold] tamsulosin  0.4 mg Oral Daily   Continuous Infusions: . lactated ringers 125 mL/hr at 06/05/17 0300     LOS:  0 days    Time spent: 25 minutes   Dessa Phi, DO [facility] [email or web address] Password [facility] 06/05/2017, 12:38 PM

## 2017-06-05 NOTE — Op Note (Addendum)
Preoperative diagnosis: Right distal ureteral stone, acute kidney injury Postoperative diagnosis:  right distal ureteral stone, acute in the injury, right distal ureteral stricture  Procedure: Cystoscopy, right retrograde pyelogram, right ureteroscopy with holmium laser lithotripsy, stone basket extraction and right ureteral stent placement  Surgeon: Junious Silk  Anesthesia: General  Indication for procedure: 47 year old African-American female with right lower quadrant pain nausea and vomiting.  She was admitted for acute kidney injury.  On CT there is a 4 mm right distal stone with proximal hydroureteronephrosis and evidence of forniceal rupture.  She was hydrated overnight as her pain improved, but the stone did not pass, she had pain the next morning and creatinine increased.  She was brought for stenting and attempt at ureteroscopy.  Findings: On cystoscopy the urethra and the bladder are unremarkable.  No stone or foreign body in the bladder.  On scout imaging the stone was noted in the right pelvis and had not progressed.  Right retrograde pyelogram- this outlined a very narrow distal ureter with a sliver of contrast up to the stone which then went around the stone and this appeared as a filling defect with proximal hydro-ureteronephrosis.    On ureteroscopy the stone was indeed noted in the distal ureter.  There distal ureter was tight and would not accommodate the dual channel semirigid.  I had to use the needlepoint 4.5 [place] scope and the distal ureter appeared whitish in nature with a tighter white band where the stone was stuck.,  Proximal to this the mucosa was edematous and erythematous where the stone was impacted.  Because the ureter was dilated the stone easily rolled back up toward the iliacs.  It was fragmented completely and removed.  Description of procedure: After consent was obtained the patient brought to the operating room.  After adequate anesthesia she was placed in  lithotomy position prepped and draped in the usual sterile fashion.  A timeout was performed to confirm the patient and procedure.  The cystoscope was passed per urethra and the bladder carefully inspected.  The right ureteral orifice was cannulated with a 6 [place] open-ended catheter and retrograde injection of contrast was performed.  I then passed a sensor wire and coiled this in the collecting system and passed the 6 [place] open-ended catheter to dilate the ureter.  This passed easily.  The bladder was drained and the scope removed.  The semirigid dual channel scope was passed but the distal ureter bunched up around the scope and I could not get it to advance.  Therefore I backed it out carefully and replaced it with a 4.5 [place] semirigid scope which went easily.  The stone was encountered and I passed a 200 m laser fiber and fragmented at 0.2 and 50.  The stone rolled proximally.  I then changed to 0.8 and 8 but the stone still continued to progress proximally.  I stop the lasering and it rolled back toward me.  I grabbed it with an engage basket brought it back down as far as I could and then released it.  I began the laser again but it rolled proximally again.  Because I had the 4.5 scope up I went back to the dual channel semirigid but again the distal ureter bunched around it and the scope would not pass.  I switched back to the  4.5 French semirigid and located the stone again.  Again it tried to roll proximally and I grabbed it with the engage in this time brought it down, but left  it engaged in the basket, cut the basket and removed it and left it in place.  I then went beside the basket and this worked well.  I fragmented the stone at 0.8 and 8 into small pieces.  The the engage basket was removed intact without difficulty.  I then passed a 0 tip basket and sequentially dropped the fragments into the bladder.  Final inspection noted there to be no other stone fragments and I was able to look all the  way up to the iliacs.  The distal ureter especially at the transition point from the stone impaction site to the white band was dilated by scope passage and fragment removal.  Because of this I decided to leave a stent without a string and a 6 x 26 cm stent was advanced.  The wire was removed with a good coil seen in the kidney and a good coil in the bladder.  I drained all the stone fragments from the bladder.  The bladder was filled and the scope removed.  I placed a 16 French Foley to max drain the system this afternoon and tonight.  She was awakened taken to the recovery room in stable condition.  Complications: None  Blood loss: Minimal  Specimens to office lab: Stone fragments  Drains: 6 x 26 cm right ureteral stent-patient will return to office for cysto, stent removal in about 10 days (keep on a nightly cephalexin 500 mg) and f/u for right renal US 4-6 weeks after that; 16 French Foley catheter  Disposition: Patient stable to PACU -- I spoke with Dr. Maylene Roes re: hematology f/u and leg swelling per Dr. Rolly Salter concerns.

## 2017-06-05 NOTE — Discharge Instructions (Signed)

## 2017-06-05 NOTE — Anesthesia Procedure Notes (Addendum)
Procedure Name: Intubation Date/Time: 06/05/2017 11:35 AM Performed by: Anne Fu, CRNA Pre-anesthesia Checklist: Patient identified, Emergency Drugs available, Suction available, Patient being monitored and Timeout performed Patient Re-evaluated:Patient Re-evaluated prior to induction Oxygen Delivery Method: Circle system utilized Preoxygenation: Pre-oxygenation with 100% oxygen Induction Type: IV induction, Rapid sequence and Cricoid Pressure applied Laryngoscope Size: Mac and 4 Grade View: Grade I Tube type: Oral Tube size: 7.5 mm Number of attempts: 1 Airway Equipment and Method: Stylet Placement Confirmation: ETT inserted through vocal cords under direct vision,  positive ETCO2 and breath sounds checked- equal and bilateral Secured at: 21 cm Tube secured with: Tape Dental Injury: Teeth and Oropharynx as per pre-operative assessment

## 2017-06-05 NOTE — Anesthesia Postprocedure Evaluation (Signed)
Anesthesia Post Note  Patient: Marissa [name]  Procedure(s) Performed: CYSTOSCOPY/RIGHT RETROGRADE PYELOGRAM/RIGHT URETEROSCOPY/STONE EXTRACTION WITH BASKET/HOLMIUM LASER LITHOTRIPSY/RIGHT URETERAL STENT PLACEMENT (Right Ureter)     Anesthesia Type: General Level of consciousness: awake Pain management: pain level controlled Vital Signs Assessment: post-procedure vital signs reviewed and stable Respiratory status: spontaneous breathing Cardiovascular status: stable Anesthetic complications: no    Last Vitals:  Vitals:   06/05/17 1358 06/05/17 1418  BP: 119/80 127/76  Pulse: (!) 103 (!) 107  Resp: 18 20  Temp: 37 C 36.9 C  SpO2: 95% 100%    Last Pain:  Vitals:   06/05/17 1418  TempSrc: Oral  PainSc:                  Haydan Mansouri

## 2017-06-05 NOTE — Transfer of Care (Signed)
Immediate Anesthesia Transfer of Care Note  Patient: Marissa [name]  Procedure(s) Performed: Procedure(s): CYSTOSCOPY/RIGHT RETROGRADE PYELOGRAM/RIGHT URETEROSCOPY/STONE EXTRACTION WITH BASKET/HOLMIUM LASER LITHOTRIPSY/RIGHT URETERAL STENT PLACEMENT (Right)  Patient Location: PACU  Anesthesia Type:General  Level of Consciousness:  sedated, patient cooperative and responds to stimulation  Airway & Oxygen Therapy:Patient Spontanous Breathing and Patient connected to face mask oxgen  Post-op Assessment:  Report given to PACU RN and Post -op Vital signs reviewed and stable  Post vital signs:  Reviewed and stable  Last Vitals:  Vitals:   06/04/17 1910 06/05/17 0523  BP: 123/69 116/74  Pulse: 91 92  Resp: 16 16  Temp: 37.4 C 36.9 C  SpO2: 820% 601%    Complications: No apparent anesthesia complications

## 2017-06-05 NOTE — Anesthesia Preprocedure Evaluation (Addendum)
Anesthesia Evaluation  Patient identified by MRN, date of birth, ID band Patient awake    Reviewed: Allergy & Precautions, NPO status , Patient's Chart, lab work & pertinent test results  Airway Mallampati: II  TM Distance: >3 FB     Dental   Pulmonary PE   breath sounds clear to auscultation       Cardiovascular + Peripheral Vascular Disease   Rhythm:Regular Rate:Normal     Neuro/Psych    GI/Hepatic negative GI ROS, Neg liver ROS,   Endo/Other    Renal/GU Renal disease   History noted. CG    Musculoskeletal   Abdominal   Peds  Hematology   Anesthesia Other Findings   Reproductive/Obstetrics                            Anesthesia Physical Anesthesia Plan  ASA: III  Anesthesia Plan: General   Post-op Pain Management:    Induction: Intravenous  PONV Risk Score and Plan: Treatment may vary due to age or medical condition, Ondansetron, Dexamethasone and Midazolam  Airway Management Planned: Oral ETT  Additional Equipment:   Intra-op Plan:   Post-operative Plan: Extubation in OR  Informed Consent: I have reviewed the patients History and Physical, chart, labs and discussed the procedure including the risks, benefits and alternatives for the proposed anesthesia with the patient or authorized representative who has indicated his/her understanding and acceptance.   Dental advisory given  Plan Discussed with: CRNA and Anesthesiologist  Anesthesia Plan Comments:        Anesthesia Quick Evaluation

## 2017-06-06 ENCOUNTER — Encounter (HOSPITAL_COMMUNITY): Payer: Self-pay | Admitting: Urology

## 2017-06-06 LAB — BASIC METABOLIC PANEL WITH GFR
Anion gap: 8 (ref 5–15)
BUN: 14 mg/dL (ref 6–20)
CO2: 24 mmol/L (ref 22–32)
Calcium: 8.9 mg/dL (ref 8.9–10.3)
Chloride: 110 mmol/L (ref 101–111)
Creatinine, Ser: 1.33 mg/dL — ABNORMAL HIGH (ref 0.44–1.00)
GFR calc Af Amer: 54 mL/min — ABNORMAL LOW
GFR calc non Af Amer: 47 mL/min — ABNORMAL LOW
Glucose, Bld: 153 mg/dL — ABNORMAL HIGH (ref 65–99)
Potassium: 4.2 mmol/L (ref 3.5–5.1)
Sodium: 142 mmol/L (ref 135–145)

## 2017-06-06 LAB — CBC
HCT: 24 % — ABNORMAL LOW (ref 36.0–46.0)
Hemoglobin: 7.1 g/dL — ABNORMAL LOW (ref 12.0–15.0)
MCH: 24.9 pg — ABNORMAL LOW (ref 26.0–34.0)
MCHC: 29.6 g/dL — ABNORMAL LOW (ref 30.0–36.0)
MCV: 84.2 fL (ref 78.0–100.0)
Platelets: 307 10*3/uL (ref 150–400)
RBC: 2.85 MIL/uL — ABNORMAL LOW (ref 3.87–5.11)
RDW: 16.6 % — ABNORMAL HIGH (ref 11.5–15.5)
WBC: 11.4 10*3/uL — ABNORMAL HIGH (ref 4.0–10.5)

## 2017-06-06 MED ORDER — CEPHALEXIN 500 MG PO CAPS: 500.0000 mg | ORAL_CAPSULE | Freq: Every day | ORAL | 0 refills | Status: AC

## 2017-06-06 NOTE — Progress Notes (Signed)
1 Day Post-Op Subjective: Patient was seen earlier today on rounds at about noon. She reported mild-moderate stent pain with voiding. She had a good diuresis. Cr much improved.   Objective: Vital signs in last 24 hours: Temp:  [98.4 F (36.9 C)-99.2 F (37.3 C)] 98.6 F (37 C) (04/15 0534) Pulse Rate:  [82-108] 90 (04/15 0534) Resp:  [17-18] 18 (04/15 0534) BP: (112-125)/(66-85) 121/85 (04/15 0534) SpO2:  [99 %-100 %] 100 % (04/15 0534) Weight:  [99.6 kg (219 lb 9.3 oz)] 99.6 kg (219 lb 9.3 oz) (04/15 0500)  Intake/Output from previous day: 04/14 0701 - 04/15 0700 In: 0932 [P.O.:840; I.V.:2400; IV Piggyback:100] Out: 3825 [Urine:3775; Blood:50] Intake/Output this shift: Total I/O In: -  Out: 1050 [Urine:1050]  Physical Exam:  NAD Looks a bit brighter today   Lab Results: Recent Labs    06/04/17 1209 06/05/17 0431 06/06/17 0524  HGB 8.6* 8.0* 7.1*  HCT 29.1* 26.6* 24.0*   BMET Recent Labs    06/05/17 0431 06/06/17 0524  NA 138 142  K 4.1 4.2  CL 108 110  CO2 21* 24  GLUCOSE 149* 153*  BUN 21* 14  CREATININE 2.18* 1.33*  CALCIUM 8.6* 8.9   Recent Labs    06/05/17 0431  INR 1.53   No results for input(s): LABURIN in the last 72 hours. Results for orders placed or performed in visit on 08/21/15  Urine culture     Status: None   Collection Time: 08/21/15 11:03 AM  Result Value Ref Range Status   Colony Count 8,000 COLONIES/ML  Final   Organism ID, Bacteria Insignificant Growth  Final    Studies/Results: Dg C-arm 1-60 Min-no Report  Result Date: 06/05/2017 Fluoroscopy was utilized by the requesting physician.  No radiographic interpretation.    Assessment/Plan:  -right ureteral stone, right ureteral stricture - Discussed with the patient the findings on ureteroscopy.  Discussed with her the stent in the right ureter and importance of follow-up for stent removal.  I told her to let me know if she has uncontrolled pain with the stent.  We'll plan to  see her back in the next week or 2 for stent removal.   LOS: 1 day   Festus Aloe 06/06/2017, 4:43 PM

## 2017-06-06 NOTE — Discharge Summary (Addendum)
Physician Discharge Summary  Marissa [name]:[id number] DOB: November 28, 1970 DOA: 06/04/2017  PCP: [name], MD  Admit date: 06/04/2017 Discharge date: 06/06/2017  Admitted From: Home Disposition:  Home  Recommendations for Outpatient Follow-up:  1. Follow up with PCP in 1 week 2. Follow up with Urology in 10 days  3. Please obtain BMP/CBC in 1 week   Discharge Condition: Stable CODE STATUS: Full  Diet recommendation: Regular  Brief/Interim Summary: Marissa [name] a 47 y.o.femalewith medical history significant ofPE on Xarelto presenting with abdominal pain, nausea, and vomiting with acute kidney injury due to a kidney stone. Patient notes that she started having right-sided abdominal pain on Thursday. She states that it stopped yesterday, but returned last night. She describes the pain as achy, constant. Nothing makes it better. Movement makes it worse. She notes the decreased appetite. She is vomited about 6 times. CT showed right hydronephrosis and hydroureter secondary to 4 mm right ureteral stone . Urology was consulted.  She underwent cystoscopy, right retrograde pyelogram, right ureteroscope with lithotripsy, stone basket extraction and right ureteral stent placement on 4/14.  Creatinine continued to improve.  Foley catheter was removed on morning of 4/15.  Patient will follow up with urology in 10 days.   Discharge Diagnoses:  Principal Problem:   Acute kidney injury [facility]) Active Problems:   History of pulmonary embolus (PE)  AKI secondary to right hydronephrosis and hydroureter secondary to 4 mm right ureteral stone -Urology consulted, s/p cystoscopy, right retrograde pyelogram, right ureteroscope with lithotripsy, stone basket extraction and right ureteral stent placement on 4/14 -Cr improving  -Keflex 500mg  qhs x 10 days per Urology recommendation  -Foley removed this morning   History of DVT and PE -Previously followed up with Dr. Lebron Conners, hematology.   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  She has been on Xarelto.  Per patient, she intends to follow-up with Dr. prolapse in 1 year, January 2020.  She denies any shortness of breath or lower extremity edema on examination this morning.    Discharge Instructions  Discharge Instructions    Call MD for:   Complete by:  As directed    Worsening right flank pain, decreased urine output   Call MD for:  difficulty breathing, headache or visual disturbances   Complete by:  As directed    Call MD for:  extreme fatigue   Complete by:  As directed    Call MD for:  hives   Complete by:  As directed    Call MD for:  persistant dizziness or light-headedness   Complete by:  As directed    Call MD for:  persistant nausea and vomiting   Complete by:  As directed    Call MD for:  severe uncontrolled pain   Complete by:  As directed    Call MD for:  temperature >100.4   Complete by:  As directed    Diet general   Complete by:  As directed    Discharge instructions   Complete by:  As directed    You were cared for by a hospitalist during your hospital stay. If you have any questions about your discharge medications or the care you received while you were in the hospital after you are discharged, you can call the unit and asked to speak with the hospitalist on call if the hospitalist that took care of you is not available. Once you are discharged, your  primary care physician will handle any further medical issues. Please note that NO REFILLS for any discharge medications will be authorized once you are discharged, as it is imperative that you return to your primary care physician (or establish a relationship with a primary care physician if you do not have one) for your aftercare needs so that they can reassess your need for medications and monitor your lab values.   Increase activity slowly   Complete by:  As  directed      Allergies as of 06/06/2017      Reactions   Codeine Nausea Only      Medication List    STOP taking these medications   scopolamine 1 MG/3DAYS Commonly known as:  TRANSDERM-SCOP (1.5 MG)     TAKE these medications   cephALEXin 500 MG capsule Commonly known as:  KEFLEX Take 1 capsule (500 mg total) by mouth at bedtime for 10 days.   cholecalciferol 1000 units tablet Commonly known as:  VITAMIN D Take 2,000 Units by mouth daily.   multivitamin with minerals Tabs tablet Take 1 tablet by mouth daily.   rivaroxaban 20 MG Tabs tablet Commonly known as:  XARELTO Take 1 tablet (20 mg total) by mouth daily with supper.      Follow-up Information    Festus Aloe, MD.   Specialty:  Urology Why:  Office will call with appointment: 10-14 days  Contact information: [place] [address] [phone]        [name], MD. Schedule an appointment as soon as possible for a visit in 1 week(s).   Specialty:  Family Medicine Contact information: [address] [place] [address] [phone]          Allergies  Allergen Reactions  . Codeine Nausea Only    Consultations:  Urology    Procedures/Studies: Ct Abdomen Pelvis Wo Contrast  Result Date: 06/04/2017 CLINICAL DATA:  RIGHT lower quadrant abdominal pain beginning Thursday, abdominal infection question peritonitis EXAM: CT ABDOMEN AND PELVIS WITHOUT CONTRAST TECHNIQUE: Multi detector helical CT imaging of the abdomen and pelvis was performed. IV contrast was not utilized due to renal dysfunction. Patient drank dilute oral contrast for exam. Sagittal and coronal MPR images reconstructed from axial data set. COMPARISON:  None FINDINGS: Lower chest: Mild RIGHT basilar atelectasis Hepatobiliary: Gallbladder surgically absent.  Liver unremarkable. Pancreas: Normal appearance Spleen: Normal appearance Adrenals/Urinary Tract: Adrenal glands unremarkable. Tiny nonobstructing  BILATERAL renal calculi. Cortical scarring LEFT kidney without dominant mass or hydronephrosis. Small cyst at upper pole LEFT kidney 2.2 cm diameter. Marked infiltrative changes of the perinephric space including edema along anterior posterior pararenal fascia. RIGHT hydronephrosis and hydroureter terminating at a 4 mm distal RIGHT ureteral calculus near the ureterovesical junction. Probable cyst at inferior pole RIGHT kidney 3.1 cm diameter. Stomach/Bowel: Mobile cecum located in mid abdomen. Normal appendix in mid abdomen. Perigastric surgical clips. Stomach and bowel loops otherwise unremarkable. Vascular/Lymphatic: Unremarkable Reproductive: Normal appearance of uterus and adnexa Other: No free air or free fluid.  No hernia. Musculoskeletal: Osseous structures unremarkable IMPRESSION: RIGHT hydronephrosis and hydroureter secondary to a 4 mm distal RIGHT ureteral calculus. Marked RIGHT perinephric edema. Additional tiny BILATERAL nonobstructing renal calculi and small renal cysts. Electronically Signed   By: Lavonia Dana M.D.   On: 06/04/2017 15:35   Dg C-arm 1-60 Min-no Report  Result Date: 06/05/2017 Fluoroscopy was utilized by the requesting physician.  No radiographic interpretation.      Discharge Exam: Vitals:  06/06/17 0225 06/06/17 0534  BP: 112/70 121/85  Pulse: 82 90  Resp: 17 18  Temp: 98.4 F (36.9 C) 98.6 F (37 C)  SpO2: 99% 100%    General: Pt is alert, awake, not in acute distress Cardiovascular: RRR, S1/S2 +, no rubs, no gallops Respiratory: CTA bilaterally, no wheezing, no rhonchi Abdominal: Soft, NT, ND, bowel sounds + Extremities: no edema, no cyanosis    The results of significant diagnostics from this hospitalization (including imaging, microbiology, ancillary and laboratory) are listed below for reference.     Microbiology: No results found for this or any previous visit (from the past 240 hour(s)).   Labs: BNP (last 3 results) No results for input(s):  BNP in the last 8760 hours. Basic Metabolic Panel: Recent Labs  Lab 06/04/17 1209 06/04/17 1513 06/05/17 0431 06/06/17 0524  NA 140 141 138 142  K 4.1 4.2 4.1 4.2  CL 106 111 108 110  CO2 22 21* 21* 24  GLUCOSE 153* 133* 149* 153*  BUN 21* 21* 21* 14  CREATININE 2.13* 2.00* 2.18* 1.33*  CALCIUM 9.4 8.5* 8.6* 8.9   Liver Function Tests: Recent Labs  Lab 06/04/17 1209 06/05/17 0431  AST 18 19  ALT 14 17  ALKPHOS 84 74  BILITOT 0.5 0.6  PROT 8.2* 6.9  ALBUMIN 3.8 3.4*   Recent Labs  Lab 06/04/17 1209  LIPASE 19   No results for input(s): AMMONIA in the last 168 hours. CBC: Recent Labs  Lab 06/04/17 1209 06/05/17 0431 06/06/17 0524  WBC 9.0 14.0* 11.4*  NEUTROABS 7.9*  --   --   HGB 8.6* 8.0* 7.1*  HCT 29.1* 26.6* 24.0*  MCV 82.9 84.2 84.2  PLT 402* 325 307   Cardiac Enzymes: No results for input(s): CKTOTAL, CKMB, CKMBINDEX, TROPONINI in the last 168 hours. BNP: Invalid input(s): POCBNP CBG: No results for input(s): GLUCAP in the last 168 hours. D-Dimer No results for input(s): DDIMER in the last 72 hours. Hgb A1c No results for input(s): HGBA1C in the last 72 hours. Lipid Profile No results for input(s): CHOL, HDL, LDLCALC, TRIG, CHOLHDL, LDLDIRECT in the last 72 hours. Thyroid function studies No results for input(s): TSH, T4TOTAL, T3FREE, THYROIDAB in the last 72 hours.  Invalid input(s): FREET3 Anemia work up No results for input(s): VITAMINB12, FOLATE, FERRITIN, TIBC, IRON, RETICCTPCT in the last 72 hours. Urinalysis    Component Value Date/Time   COLORURINE STRAW (A) 06/04/2017 1630   APPEARANCEUR CLEAR 06/04/2017 1630   LABSPEC 1.010 06/04/2017 1630   PHURINE 6.0 06/04/2017 1630   GLUCOSEU NEGATIVE 06/04/2017 1630   HGBUR MODERATE (A) 06/04/2017 1630   BILIRUBINUR NEGATIVE 06/04/2017 1630   BILIRUBINUR n 11/22/2013 1028   KETONESUR NEGATIVE 06/04/2017 1630   PROTEINUR NEGATIVE 06/04/2017 1630   UROBILINOGEN 0.2 11/22/2013 1028    UROBILINOGEN 1.0 11/28/2012 1125   NITRITE NEGATIVE 06/04/2017 1630   LEUKOCYTESUR NEGATIVE 06/04/2017 1630   Sepsis Labs Invalid input(s): PROCALCITONIN,  WBC,  LACTICIDVEN Microbiology No results found for this or any previous visit (from the past 240 hour(s)).   Patient was seen and examined on the day of discharge and was found to be in stable condition. Time coordinating discharge: 35 minutes including assessment and coordination of care, as well as examination of the patient.   SIGNED:  Dessa Phi, DO [facility] Pager [phone]  If 7PM-7AM, please contact night-coverage [email or web address] Password [facility] 06/06/2017, 10:19 AM

## 2017-06-13 DIAGNOSIS — N202 Calculus of kidney with calculus of ureter: Secondary | ICD-10-CM | POA: Diagnosis not present

## 2017-06-20 DIAGNOSIS — B9620 Unspecified Escherichia coli [E. coli] as the cause of diseases classified elsewhere: Secondary | ICD-10-CM | POA: Diagnosis not present

## 2017-06-20 DIAGNOSIS — N201 Calculus of ureter: Secondary | ICD-10-CM | POA: Diagnosis not present

## 2017-06-20 DIAGNOSIS — N390 Urinary tract infection, site not specified: Secondary | ICD-10-CM | POA: Diagnosis not present

## 2017-06-28 DIAGNOSIS — N201 Calculus of ureter: Secondary | ICD-10-CM | POA: Diagnosis not present

## 2017-07-08 ENCOUNTER — Telehealth: Payer: Self-pay | Admitting: Family Medicine

## 2017-07-08 NOTE — Telephone Encounter (Signed)
Copied from [place] [phone]. Topic: General - Other >> Jul 08, 2017  4:00 PM Yvette Rack wrote: Reason for CRM: Marissa [name] with [employer] called to advise that they will attempt to follow the pt with case management due to inpatient stay for acute kidney failure. Cb# [phone]

## 2017-07-11 NOTE — Telephone Encounter (Signed)
FYI

## 2017-08-30 ENCOUNTER — Other Ambulatory Visit: Payer: Self-pay | Admitting: Women's Health

## 2017-08-30 DIAGNOSIS — Z1231 Encounter for screening mammogram for malignant neoplasm of breast: Secondary | ICD-10-CM

## 2017-09-05 DIAGNOSIS — R8271 Bacteriuria: Secondary | ICD-10-CM | POA: Diagnosis not present

## 2017-09-05 DIAGNOSIS — N202 Calculus of kidney with calculus of ureter: Secondary | ICD-10-CM | POA: Diagnosis not present

## 2017-11-01 ENCOUNTER — Ambulatory Visit (INDEPENDENT_AMBULATORY_CARE_PROVIDER_SITE_OTHER): Payer: BLUE CROSS/BLUE SHIELD | Admitting: Women's Health

## 2017-11-01 ENCOUNTER — Ambulatory Visit: Payer: BLUE CROSS/BLUE SHIELD

## 2017-11-01 ENCOUNTER — Encounter: Payer: Self-pay | Admitting: Women's Health

## 2017-11-01 VITALS — BP 122/80 | Ht 65.0 in | Wt 220.0 lb

## 2017-11-01 DIAGNOSIS — Z01419 Encounter for gynecological examination (general) (routine) without abnormal findings: Secondary | ICD-10-CM | POA: Diagnosis not present

## 2017-11-01 DIAGNOSIS — Z23 Encounter for immunization: Secondary | ICD-10-CM | POA: Diagnosis not present

## 2017-11-01 NOTE — Patient Instructions (Signed)
Carbohydrate Counting for Diabetes Mellitus, Adult Carbohydrate counting is a method for keeping track of how many carbohydrates you eat. Eating carbohydrates naturally increases the amount of sugar (glucose) in the blood. Counting how many carbohydrates you eat helps keep your blood glucose within normal limits, which helps you manage your diabetes (diabetes mellitus). It is important to know how many carbohydrates you can safely have in each meal. This is different for every person. A diet and nutrition specialist (registered dietitian) can help you make a meal plan and calculate how many carbohydrates you should have at each meal and snack. Carbohydrates are found in the following foods:  Grains, such as breads and cereals.  Dried beans and soy products.  Starchy vegetables, such as potatoes, peas, and corn.  Fruit and fruit juices.  Milk and yogurt.  Sweets and snack foods, such as cake, cookies, candy, chips, and soft drinks.  How do I count carbohydrates? There are two ways to count carbohydrates in food. You can use either of the methods or a combination of both. Reading "Nutrition Facts" on packaged food The "Nutrition Facts" list is included on the labels of almost all packaged foods and beverages in the [place] It includes:  The serving size.  Information about nutrients in each serving, including the grams (g) of carbohydrate per serving.  To use the "Nutrition Facts":  Decide how many servings you will have.  Multiply the number of servings by the number of carbohydrates per serving.  The resulting number is the total amount of carbohydrates that you will be having.  Learning standard serving sizes of other foods When you eat foods containing carbohydrates that are not packaged or do not include "Nutrition Facts" on the label, you need to measure the servings in order to count the amount of carbohydrates:  Measure the foods that you will eat with a food scale or  measuring cup, if needed.  Decide how many standard-size servings you will eat.  Multiply the number of servings by 15. Most carbohydrate-rich foods have about 15 g of carbohydrates per serving. ? For example, if you eat 8 oz (170 g) of strawberries, you will have eaten 2 servings and 30 g of carbohydrates (2 servings x 15 g = 30 g).  For foods that have more than one food mixed, such as soups and casseroles, you must count the carbohydrates in each food that is included.  The following list contains standard serving sizes of common carbohydrate-rich foods. Each of these servings has about 15 g of carbohydrates:   hamburger bun or  English muffin.   oz (15 mL) syrup.   oz (14 g) jelly.  1 slice of bread.  1 six-inch tortilla.  3 oz (85 g) cooked rice or pasta.  4 oz (113 g) cooked dried beans.  4 oz (113 g) starchy vegetable, such as peas, corn, or potatoes.  4 oz (113 g) hot cereal.  4 oz (113 g) mashed potatoes or  of a large baked potato.  4 oz (113 g) canned or frozen fruit.  4 oz (120 mL) fruit juice.  4-6 crackers.  6 chicken nuggets.  6 oz (170 g) unsweetened dry cereal.  6 oz (170 g) plain fat-free yogurt or yogurt sweetened with artificial sweeteners.  8 oz (240 mL) milk.  8 oz (170 g) fresh fruit or one small piece of fruit.  24 oz (680 g) popped popcorn.  Example of carbohydrate counting Sample meal  3 oz (85 g) chicken breast.    6 oz (170 g) brown rice.  4 oz (113 g) corn.  8 oz (240 mL) milk.  8 oz (170 g) strawberries with sugar-free whipped topping. Carbohydrate calculation 1. Identify the foods that contain carbohydrates: ? Rice. ? Corn. ? Milk. ? Strawberries. 2. Calculate how many servings you have of each food: ? 2 servings rice. ? 1 serving corn. ? 1 serving milk. ? 1 serving strawberries. 3. Multiply each number of servings by 15 g: ? 2 servings rice x 15 g = 30 g. ? 1 serving corn x 15 g = 15 g. ? 1 serving milk x 15  g = 15 g. ? 1 serving strawberries x 15 g = 15 g. 4. Add together all of the amounts to find the total grams of carbohydrates eaten: ? 30 g + 15 g + 15 g + 15 g = 75 g of carbohydrates total. This information is not intended to replace advice given to you by your health care provider. Make sure you discuss any questions you have with your health care provider. Document Released: 02/08/2005 Document Revised: 08/29/2015 Document Reviewed: 07/23/2015 Elsevier Interactive Patient Education  2018 [employer] Health Maintenance for Postmenopausal Women Menopause is a normal process in which your reproductive ability comes to an end. This process happens gradually over a span of months to years, usually between the ages of 48 and 55. Menopause is complete when you have missed 12 consecutive menstrual periods. It is important to talk with your health care provider about some of the most common conditions that affect postmenopausal women, such as heart disease, cancer, and bone loss (osteoporosis). Adopting a healthy lifestyle and getting preventive care can help to promote your health and wellness. Those actions can also lower your chances of developing some of these common conditions. What should I know about menopause? During menopause, you may experience a number of symptoms, such as:  Moderate-to-severe hot flashes.  Night sweats.  Decrease in sex drive.  Mood swings.  Headaches.  Tiredness.  Irritability.  Memory problems.  Insomnia.  Choosing to treat or not to treat menopausal changes is an individual decision that you make with your health care provider. What should I know about hormone replacement therapy and supplements? Hormone therapy products are effective for treating symptoms that are associated with menopause, such as hot flashes and night sweats. Hormone replacement carries certain risks, especially as you become older. If you are thinking about using estrogen or estrogen  with progestin treatments, discuss the benefits and risks with your health care provider. What should I know about heart disease and stroke? Heart disease, heart attack, and stroke become more likely as you age. This may be due, in part, to the hormonal changes that your body experiences during menopause. These can affect how your body processes dietary fats, triglycerides, and cholesterol. Heart attack and stroke are both medical emergencies. There are many things that you can do to help prevent heart disease and stroke:  Have your blood pressure checked at least every 1-2 years. High blood pressure causes heart disease and increases the risk of stroke.  If you are 55-79 years old, ask your health care provider if you should take aspirin to prevent a heart attack or a stroke.  Do not use any tobacco products, including cigarettes, chewing tobacco, or electronic cigarettes. If you need help quitting, ask your health care provider.  It is important to eat a healthy diet and maintain a healthy weight. ? Be sure to include   plenty of vegetables, fruits, low-fat dairy products, and lean protein. ? Avoid eating foods that are high in solid fats, added sugars, or salt (sodium).  Get regular exercise. This is one of the most important things that you can do for your health. ? Try to exercise for at least 150 minutes each week. The type of exercise that you do should increase your heart rate and make you sweat. This is known as moderate-intensity exercise. ? Try to do strengthening exercises at least twice each week. Do these in addition to the moderate-intensity exercise.  Know your numbers.Ask your health care provider to check your cholesterol and your blood glucose. Continue to have your blood tested as directed by your health care provider.  What should I know about cancer screening? There are several types of cancer. Take the following steps to reduce your risk and to catch any cancer development  as early as possible. Breast Cancer  Practice breast self-awareness. ? This means understanding how your breasts normally appear and feel. ? It also means doing regular breast self-exams. Let your health care provider know about any changes, no matter how small.  If you are 40 or older, have a clinician do a breast exam (clinical breast exam or CBE) every year. Depending on your age, family history, and medical history, it may be recommended that you also have a yearly breast X-ray (mammogram).  If you have a family history of breast cancer, talk with your health care provider about genetic screening.  If you are at high risk for breast cancer, talk with your health care provider about having an MRI and a mammogram every year.  Breast cancer (BRCA) gene test is recommended for women who have family members with BRCA-related cancers. Results of the assessment will determine the need for genetic counseling and BRCA1 and for BRCA2 testing. BRCA-related cancers include these types: ? Breast. This occurs in males or females. ? Ovarian. ? Tubal. This may also be called fallopian tube cancer. ? Cancer of the abdominal or pelvic lining (peritoneal cancer). ? Prostate. ? Pancreatic.  Cervical, Uterine, and Ovarian Cancer Your health care provider may recommend that you be screened regularly for cancer of the pelvic organs. These include your ovaries, uterus, and vagina. This screening involves a pelvic exam, which includes checking for microscopic changes to the surface of your cervix (Pap test).  For women ages 21-65, health care providers may recommend a pelvic exam and a Pap test every three years. For women ages 30-65, they may recommend the Pap test and pelvic exam, combined with testing for human papilloma virus (HPV), every five years. Some types of HPV increase your risk of cervical cancer. Testing for HPV may also be done on women of any age who have unclear Pap test results.  Other health  care providers may not recommend any screening for nonpregnant women who are considered low risk for pelvic cancer and have no symptoms. Ask your health care provider if a screening pelvic exam is right for you.  If you have had past treatment for cervical cancer or a condition that could lead to cancer, you need Pap tests and screening for cancer for at least 20 years after your treatment. If Pap tests have been discontinued for you, your risk factors (such as having a new sexual partner) need to be reassessed to determine if you should start having screenings again. Some women have medical problems that increase the chance of getting cervical cancer. In these cases, your   health care provider may recommend that you have screening and Pap tests more often.  If you have a family history of uterine cancer or ovarian cancer, talk with your health care provider about genetic screening.  If you have vaginal bleeding after reaching menopause, tell your health care provider.  There are currently no reliable tests available to screen for ovarian cancer.  Lung Cancer Lung cancer screening is recommended for adults 55-80 years old who are at high risk for lung cancer because of a history of smoking. A yearly low-dose CT scan of the lungs is recommended if you:  Currently smoke.  Have a history of at least 30 pack-years of smoking and you currently smoke or have quit within the past 15 years. A pack-year is smoking an average of one pack of cigarettes per day for one year.  Yearly screening should:  Continue until it has been 15 years since you quit.  Stop if you develop a health problem that would prevent you from having lung cancer treatment.  Colorectal Cancer  This type of cancer can be detected and can often be prevented.  Routine colorectal cancer screening usually begins at age 50 and continues through age 75.  If you have risk factors for colon cancer, your health care provider may  recommend that you be screened at an earlier age.  If you have a family history of colorectal cancer, talk with your health care provider about genetic screening.  Your health care provider may also recommend using home test kits to check for hidden blood in your stool.  A small camera at the end of a tube can be used to examine your colon directly (sigmoidoscopy or colonoscopy). This is done to check for the earliest forms of colorectal cancer.  Direct examination of the colon should be repeated every 5-10 years until age 75. However, if early forms of precancerous polyps or small growths are found or if you have a family history or genetic risk for colorectal cancer, you may need to be screened more often.  Skin Cancer  Check your skin from head to toe regularly.  Monitor any moles. Be sure to tell your health care provider: ? About any new moles or changes in moles, especially if there is a change in a mole's shape or color. ? If you have a mole that is larger than the size of a pencil eraser.  If any of your family members has a history of skin cancer, especially at a young age, talk with your health care provider about genetic screening.  Always use sunscreen. Apply sunscreen liberally and repeatedly throughout the day.  Whenever you are outside, protect yourself by wearing long sleeves, pants, a wide-brimmed hat, and sunglasses.  What should I know about osteoporosis? Osteoporosis is a condition in which bone destruction happens more quickly than new bone creation. After menopause, you may be at an increased risk for osteoporosis. To help prevent osteoporosis or the bone fractures that can happen because of osteoporosis, the following is recommended:  If you are 19-50 years old, get at least 1,000 mg of calcium and at least 600 mg of vitamin D per day.  If you are older than age 50 but younger than age 70, get at least 1,200 mg of calcium and at least 600 mg of vitamin D per  day.  If you are older than age 70, get at least 1,200 mg of calcium and at least 800 mg of vitamin D per day.    Smoking and excessive alcohol intake increase the risk of osteoporosis. Eat foods that are rich in calcium and vitamin D, and do weight-bearing exercises several times each week as directed by your health care provider. What should I know about how menopause affects my mental health? Depression may occur at any age, but it is more common as you become older. Common symptoms of depression include:  Low or sad mood.  Changes in sleep patterns.  Changes in appetite or eating patterns.  Feeling an overall lack of motivation or enjoyment of activities that you previously enjoyed.  Frequent crying spells.  Talk with your health care provider if you think that you are experiencing depression. What should I know about immunizations? It is important that you get and maintain your immunizations. These include:  Tetanus, diphtheria, and pertussis (Tdap) booster vaccine.  Influenza every year before the flu season begins.  Pneumonia vaccine.  Shingles vaccine.  Your health care provider may also recommend other immunizations. This information is not intended to replace advice given to you by your health care provider. Make sure you discuss any questions you have with your health care provider. Document Released: 04/02/2005 Document Revised: 08/29/2015 Document Reviewed: 11/12/2014 Elsevier Interactive Patient Education  2018 [employer].
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
## 2017-11-02 LAB — PAP, TP IMAGING W/ HPV RNA, RFLX HPV TYPE 16,18/45: HPV DNA High Risk: NOT DETECTED

## 2017-12-09 ENCOUNTER — Ambulatory Visit
Admission: RE | Admit: 2017-12-09 | Discharge: 2017-12-09 | Disposition: A | Discharge status: Home / Self Care | Payer: BLUE CROSS/BLUE SHIELD | Source: Ambulatory Visit | Attending: Women's Health | Admitting: Women's Health

## 2017-12-09 ENCOUNTER — Other Ambulatory Visit: Payer: Self-pay | Admitting: Hematology and Oncology

## 2017-12-09 DIAGNOSIS — Z1231 Encounter for screening mammogram for malignant neoplasm of breast: Secondary | ICD-10-CM

## 2017-12-09 DIAGNOSIS — M25511 Pain in right shoulder: Secondary | ICD-10-CM | POA: Diagnosis not present

## 2017-12-09 DIAGNOSIS — M25512 Pain in left shoulder: Secondary | ICD-10-CM | POA: Diagnosis not present

## 2017-12-19 ENCOUNTER — Other Ambulatory Visit: Payer: Self-pay

## 2018-01-04 ENCOUNTER — Telehealth: Payer: Self-pay | Admitting: Family Medicine

## 2018-01-04 ENCOUNTER — Telehealth: Payer: Self-pay

## 2018-01-04 NOTE — Telephone Encounter (Signed)
Pt aunt calling stating that the pt has an appt on 03-03-18 with hematologist and that she need a refill until then the hematologist office told her Aunt that Dr Volanda Napoleon should fill this until her appt

## 2018-01-04 NOTE — Telephone Encounter (Signed)
Received a call from patient's aunt- Acasia Skilton stating that patient will be out of Xarelto in a couple of days. Patient has an appointment with Dr. Audelia Hives 03/03/2018. Patient was last seen in the office by Dr. Lebron Conners 02/2016. Dr. Audelia Hives made aware and stated that patient should contact her PCP Grier Mitts, MD to manage Xarelto at this time. Verbalized understanding. Patient will call the office back if any issues arise before her scheduled follow up appointment.

## 2018-01-04 NOTE — Telephone Encounter (Signed)
FYI

## 2018-01-04 NOTE — Telephone Encounter (Signed)
Copied from [place] [id number]. Topic: Quick Communication - Rx Refill/Question >> Jan 04, 2018  9:58 AM Yvette Rack wrote: Medication: Rivaroxaban (XARELTO) 20 MG TABS tablet (Expired)  Has the patient contacted their pharmacy? Yes.   (Agent: If no, request that the patient contact the pharmacy for the refill.) (Agent: If yes, when and what did the pharmacy advise?) call her provider  Preferred Pharmacy (with phone number or street name): [facility] ([address]), [place] [phone] (Phone) [phone] (Fax)    Agent: Please be advised that RX refills may take up to 3 business days. We ask that you follow-up with your pharmacy.

## 2018-01-04 NOTE — Telephone Encounter (Signed)
Rx request is at office- has been pended for provider review. Note sent for more information for PCP.

## 2018-01-04 NOTE — Telephone Encounter (Signed)
Patient's aunt calling and wanted to know what Dr Volanda Napoleon had decided on with the Xarelto medication. Advised that I could not give out any information to her since she was not on DPR. Advised to have patient give [place] a call if she wanted an update.

## 2018-01-04 NOTE — Telephone Encounter (Signed)
Please Advise

## 2018-01-05 ENCOUNTER — Other Ambulatory Visit: Payer: Self-pay

## 2018-01-05 ENCOUNTER — Telehealth: Payer: Self-pay

## 2018-01-05 MED ORDER — RIVAROXABAN 20 MG PO TABS: 20.0000 mg | ORAL_TABLET | Freq: Every day | ORAL | 2 refills | Status: DC

## 2018-01-05 MED ORDER — RIVAROXABAN 20 MG PO TABS: 20.0000 mg | ORAL_TABLET | Freq: Every day | ORAL | 0 refills | Status: DC

## 2018-01-05 NOTE — Telephone Encounter (Signed)
Called back regarding message from yesterday about Xarelto Rx. Per Dr. Audelia Hives, send scheduling message to for appt for 12/2 and refill  Xarelto for 30 days. She verbalized understanding. Scheduling message sent.

## 2018-01-05 NOTE — Telephone Encounter (Signed)
Rx was sent to [facility] for refill.

## 2018-01-05 NOTE — Telephone Encounter (Signed)
1. Pt seen once by this provider 1 yr ago.  2. No refill request were sent to this provider.  3. Xarelto was being filled by Dr. Lebron Conners.  [facility] with sending in refills, but please advise pt and or family member of refill policy for the future.

## 2018-01-06 ENCOUNTER — Telehealth: Payer: Self-pay | Admitting: Hematology and Oncology

## 2018-01-06 NOTE — Telephone Encounter (Signed)
Scheduled appt per 11/14 sch message - left message for patient with appt date and time.

## 2018-01-13 DIAGNOSIS — M25511 Pain in right shoulder: Secondary | ICD-10-CM | POA: Diagnosis not present

## 2018-01-13 DIAGNOSIS — M25512 Pain in left shoulder: Secondary | ICD-10-CM | POA: Diagnosis not present

## 2018-01-22 ENCOUNTER — Other Ambulatory Visit: Payer: Self-pay | Admitting: Hematology and Oncology

## 2018-01-22 DIAGNOSIS — D72829 Elevated white blood cell count, unspecified: Secondary | ICD-10-CM

## 2018-01-22 DIAGNOSIS — D649 Anemia, unspecified: Secondary | ICD-10-CM

## 2018-01-23 ENCOUNTER — Encounter: Payer: Self-pay | Admitting: Hematology and Oncology

## 2018-01-23 ENCOUNTER — Inpatient Hospital Stay: Payer: BLUE CROSS/BLUE SHIELD | Attending: Hematology and Oncology | Admitting: Hematology and Oncology

## 2018-01-23 VITALS — BP 146/84 | HR 98 | Temp 99.5°F | Resp 17 | Ht 65.0 in | Wt 225.0 lb

## 2018-01-23 DIAGNOSIS — D72829 Elevated white blood cell count, unspecified: Secondary | ICD-10-CM | POA: Diagnosis not present

## 2018-01-23 DIAGNOSIS — D649 Anemia, unspecified: Secondary | ICD-10-CM | POA: Diagnosis not present

## 2018-01-23 DIAGNOSIS — I2782 Chronic pulmonary embolism: Secondary | ICD-10-CM | POA: Diagnosis not present

## 2018-01-23 DIAGNOSIS — I82533 Chronic embolism and thrombosis of popliteal vein, bilateral: Secondary | ICD-10-CM | POA: Insufficient documentation

## 2018-01-23 DIAGNOSIS — D500 Iron deficiency anemia secondary to blood loss (chronic): Secondary | ICD-10-CM | POA: Diagnosis not present

## 2018-01-23 DIAGNOSIS — I2609 Other pulmonary embolism with acute cor pulmonale: Secondary | ICD-10-CM | POA: Diagnosis not present

## 2018-01-23 MED ORDER — RIVAROXABAN 20 MG PO TABS: 20.0000 mg | ORAL_TABLET | Freq: Every day | ORAL | 0 refills | Status: DC
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
## 2018-01-23 NOTE — Patient Instructions (Signed)
We discussed in general your history with both deep vein thrombosis and pulmonary embolism.  It is recommended that she continue Xarelto as previously recommended.  A Xarelto prescription for 1 month was written.  7 months ago, your hemoglobin was 7.1 g/dL.  There have been no laboratory studies since.  Because your menses are monthly lasting for 7 days, it is recommended that iron studies be obtained at your earliest available date.  At your request, laboratory studies have been requested for December 20.  Barring any unforeseen complications, your next scheduled doctor visit to discuss those results is on December 24. He will be seen at that time by another provider since I am leaving practice.  Please do not hesitate to call in the interim should any new or untoward problems arise.  Thank you! Ladona Ridgel, MD Hematology/Oncology

## 2018-01-24 ENCOUNTER — Telehealth: Payer: Self-pay | Admitting: Hematology and Oncology

## 2018-01-24 NOTE — Telephone Encounter (Signed)
Called patient, no answer. Printed and mailed calendar.

## 2018-02-07 ENCOUNTER — Telehealth: Payer: Self-pay | Admitting: Internal Medicine

## 2018-02-07 NOTE — Telephone Encounter (Signed)
RR patient moved from 12/24 to 12/26 with [facility]. Not able to reach patient by phone or leave message. Mailed updated scheduled and added comment to 12/20 lab to send patient to see me re f/u appointment change.

## 2018-02-10 ENCOUNTER — Telehealth: Payer: Self-pay

## 2018-02-10 ENCOUNTER — Inpatient Hospital Stay: Payer: BLUE CROSS/BLUE SHIELD

## 2018-02-10 DIAGNOSIS — D72829 Elevated white blood cell count, unspecified: Secondary | ICD-10-CM

## 2018-02-10 DIAGNOSIS — I2609 Other pulmonary embolism with acute cor pulmonale: Secondary | ICD-10-CM | POA: Diagnosis not present

## 2018-02-10 DIAGNOSIS — I2782 Chronic pulmonary embolism: Secondary | ICD-10-CM | POA: Diagnosis not present

## 2018-02-10 DIAGNOSIS — D649 Anemia, unspecified: Secondary | ICD-10-CM

## 2018-02-10 DIAGNOSIS — I82533 Chronic embolism and thrombosis of popliteal vein, bilateral: Secondary | ICD-10-CM | POA: Diagnosis not present

## 2018-02-10 DIAGNOSIS — D500 Iron deficiency anemia secondary to blood loss (chronic): Secondary | ICD-10-CM | POA: Diagnosis not present

## 2018-02-10 LAB — COMPREHENSIVE METABOLIC PANEL
ALT: 12 U/L (ref 0–44)
AST: 11 U/L — ABNORMAL LOW (ref 15–41)
Albumin: 3.6 g/dL (ref 3.5–5.0)
Alkaline Phosphatase: 83 U/L (ref 38–126)
Anion gap: 8 (ref 5–15)
BUN: 9 mg/dL (ref 6–20)
CO2: 24 mmol/L (ref 22–32)
Calcium: 8.8 mg/dL — ABNORMAL LOW (ref 8.9–10.3)
Chloride: 109 mmol/L (ref 98–111)
Creatinine, Ser: 1.11 mg/dL — ABNORMAL HIGH (ref 0.44–1.00)
GFR calc Af Amer: >60 mL/min (ref >60)
GFR calc non Af Amer: 59 mL/min — ABNORMAL LOW (ref >60)
Glucose, Bld: 141 mg/dL — ABNORMAL HIGH (ref 70–99)
Potassium: 3.8 mmol/L (ref 3.5–5.1)
Sodium: 141 mmol/L (ref 135–145)
Total Bilirubin: 0.2 mg/dL — ABNORMAL LOW (ref 0.3–1.2)
Total Protein: 6.9 g/dL (ref 6.5–8.1)

## 2018-02-10 LAB — CBC WITH DIFFERENTIAL (CANCER CENTER ONLY)
Abs Immature Granulocytes: 0.01 10*3/uL (ref 0.00–0.07)
Basophils Absolute: 0 10*3/uL (ref 0.0–0.1)
Basophils Relative: 1 %
Eosinophils Absolute: 0.1 10*3/uL (ref 0.0–0.5)
Eosinophils Relative: 3 %
HCT: 25.2 % — ABNORMAL LOW (ref 36.0–46.0)
Hemoglobin: 7 g/dL — CL (ref 12.0–15.0)
Immature Granulocytes: 0 %
Lymphocytes Relative: 30 %
Lymphs Abs: 1.1 10*3/uL (ref 0.7–4.0)
MCH: 23.3 pg — ABNORMAL LOW (ref 26.0–34.0)
MCHC: 27.8 g/dL — ABNORMAL LOW (ref 30.0–36.0)
MCV: 84 fL (ref 80.0–100.0)
Monocytes Absolute: 0.3 10*3/uL (ref 0.1–1.0)
Monocytes Relative: 8 %
Neutro Abs: 2.1 10*3/uL (ref 1.7–7.7)
Neutrophils Relative %: 58 %
Platelet Count: 321 10*3/uL (ref 150–400)
RBC: 3 MIL/uL — ABNORMAL LOW (ref 3.87–5.11)
RDW: 20.6 % — ABNORMAL HIGH (ref 11.5–15.5)
WBC Count: 3.6 10*3/uL — ABNORMAL LOW (ref 4.0–10.5)
nRBC: 0 % (ref 0.0–0.2)

## 2018-02-10 LAB — RETIC PANEL
Immature Retic Fract: 20.6 % — ABNORMAL HIGH (ref 2.3–15.9)
RBC.: 3 MIL/uL — ABNORMAL LOW (ref 3.87–5.11)
Retic Count, Absolute: 105.3 10*3/uL (ref 19.0–186.0)
Retic Ct Pct: 3.5 % — ABNORMAL HIGH (ref 0.4–3.1)
Reticulocyte Hemoglobin: 22.9 pg — ABNORMAL LOW (ref >27.9)

## 2018-02-10 LAB — IRON AND TIBC
Iron: 21 ug/dL — ABNORMAL LOW (ref 41–142)
Saturation Ratios: 5 % — ABNORMAL LOW (ref 21–57)
TIBC: 393 ug/dL (ref 236–444)
UIBC: 372 ug/dL (ref 120–384)

## 2018-02-10 LAB — FERRITIN: Ferritin: 11 ng/mL (ref 11–307)

## 2018-02-10 NOTE — Telephone Encounter (Signed)
Per patient request to move Higgs appointment to 3pm. Per 12/20 walk in

## 2018-02-11 LAB — HAPTOGLOBIN: Haptoglobin: 214 mg/dL (ref 42–296)

## 2018-02-14 ENCOUNTER — Ambulatory Visit: Payer: BLUE CROSS/BLUE SHIELD | Admitting: Hematology and Oncology

## 2018-02-16 ENCOUNTER — Encounter: Payer: Self-pay | Admitting: Internal Medicine

## 2018-02-16 ENCOUNTER — Inpatient Hospital Stay (HOSPITAL_BASED_OUTPATIENT_CLINIC_OR_DEPARTMENT_OTHER): Payer: BLUE CROSS/BLUE SHIELD | Admitting: Internal Medicine

## 2018-02-16 ENCOUNTER — Inpatient Hospital Stay: Payer: BLUE CROSS/BLUE SHIELD | Admitting: Internal Medicine

## 2018-02-16 VITALS — BP 142/84 | HR 87 | Temp 98.8°F | Resp 17 | Ht 65.0 in | Wt 221.6 lb

## 2018-02-16 DIAGNOSIS — D500 Iron deficiency anemia secondary to blood loss (chronic): Secondary | ICD-10-CM | POA: Diagnosis not present

## 2018-02-16 DIAGNOSIS — I2782 Chronic pulmonary embolism: Secondary | ICD-10-CM | POA: Diagnosis not present

## 2018-02-16 DIAGNOSIS — I2609 Other pulmonary embolism with acute cor pulmonale: Secondary | ICD-10-CM | POA: Diagnosis not present

## 2018-02-16 DIAGNOSIS — I82533 Chronic embolism and thrombosis of popliteal vein, bilateral: Secondary | ICD-10-CM | POA: Diagnosis not present

## 2018-02-16 DIAGNOSIS — D72829 Elevated white blood cell count, unspecified: Secondary | ICD-10-CM | POA: Diagnosis not present

## 2018-02-16 DIAGNOSIS — D649 Anemia, unspecified: Secondary | ICD-10-CM | POA: Diagnosis not present

## 2018-02-16 HISTORY — DX: Iron deficiency anemia secondary to blood loss (chronic): D50.0
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
## 2018-02-17 ENCOUNTER — Other Ambulatory Visit (HOSPITAL_COMMUNITY): Payer: Self-pay | Admitting: Internal Medicine

## 2018-02-17 ENCOUNTER — Telehealth: Payer: Self-pay | Admitting: *Deleted

## 2018-02-17 ENCOUNTER — Telehealth: Payer: Self-pay | Admitting: Internal Medicine

## 2018-02-17 ENCOUNTER — Other Ambulatory Visit: Payer: Self-pay | Admitting: *Deleted

## 2018-02-17 NOTE — Telephone Encounter (Signed)
Scheduled appt per 12/27 sch message - pt aunt is aware of appt date and time

## 2018-02-17 NOTE — Telephone Encounter (Signed)
Received tc from pt's aunt regarding dates for pt's doppler/CT scan.  Pt is able to be off 03/03/18 and would like these re-scheduled to that day.[name] Kitchen Appropriate calls made.  Doppler study to be 03/03/18 @ 9am and her CT scan will be @ 11am on the same day.  Marissa [name] voiced understanding and will pass information on to the patient, Marissa [name].

## 2018-02-17 NOTE — Telephone Encounter (Signed)
TCT patient regarding lab work from yesterday. Dr. Walden Field needs patient to come in for iron infusion tomorrow and again next Friday. Left message for pt to call back ASAP.  TCT made to pt's aunt, Lanissa Cashen. Spoke with her re: appts.  She states pt is at work ([employer]) and cannot take calls at work. Aunt will text patient to call her about appts. on tomorrow and next Friday.  High priority scheduling message sent for these appts.  [place] for tomorrow (Saturday, 02/18/18 per Laurence Compton).

## 2018-02-17 NOTE — Telephone Encounter (Signed)
TCT pt's auntto convey appt time for pt to have bilateral lower extremity doppler study done.  Currently it is scheduled for 02/23/18 @ 3pm. If this time does not work out for patient she can Electrical engineer # [phone]. Above information given to pt's aunt who give it to patient.  Marissa [name] voiced understanding.

## 2018-02-17 NOTE — Telephone Encounter (Signed)
Spoke with patient and scheduled appointments per 12/26 los.

## 2018-02-18 ENCOUNTER — Inpatient Hospital Stay: Payer: BLUE CROSS/BLUE SHIELD

## 2018-02-18 VITALS — BP 122/75 | HR 75 | Temp 98.3°F | Resp 16

## 2018-02-18 DIAGNOSIS — D649 Anemia, unspecified: Secondary | ICD-10-CM | POA: Diagnosis not present

## 2018-02-18 DIAGNOSIS — I2782 Chronic pulmonary embolism: Secondary | ICD-10-CM | POA: Diagnosis not present

## 2018-02-18 DIAGNOSIS — I82533 Chronic embolism and thrombosis of popliteal vein, bilateral: Secondary | ICD-10-CM | POA: Diagnosis not present

## 2018-02-18 DIAGNOSIS — D500 Iron deficiency anemia secondary to blood loss (chronic): Secondary | ICD-10-CM

## 2018-02-18 DIAGNOSIS — I2609 Other pulmonary embolism with acute cor pulmonale: Secondary | ICD-10-CM | POA: Diagnosis not present

## 2018-02-18 DIAGNOSIS — D72829 Elevated white blood cell count, unspecified: Secondary | ICD-10-CM | POA: Diagnosis not present

## 2018-02-18 MED ORDER — SODIUM CHLORIDE 0.9 % IV SOLN
Freq: Once | INTRAVENOUS | Status: DC
  Filled 2018-02-18: qty 250

## 2018-02-18 MED ORDER — SODIUM CHLORIDE 0.9 % IV SOLN
510.0000 mg | Freq: Once | INTRAVENOUS | Status: AC
  Administered 2018-02-18: 510 mg via INTRAVENOUS
  Filled 2018-02-18: qty 17

## 2018-02-18 NOTE — Patient Instructions (Addendum)
 Iron Deficiency Anemia, Adult Iron-deficiency anemia is when you have a low amount of red blood cells or hemoglobin. This happens because you have too little iron in your body. Hemoglobin carries oxygen to parts of the body. Anemia can cause your body to not get enough oxygen. It may or may not cause symptoms. Follow these instructions at home: Medicines  Take over-the-counter and prescription medicines only as told by your doctor. This includes iron pills (supplements) and vitamins.  If you cannot handle taking iron pills by mouth, ask your doctor about getting iron through: ? A vein (intravenously). ? A shot (injection) into a muscle.  Take iron pills when your stomach is empty. If you cannot handle this, take them with food.  Do not drink milk or take antacids at the same time as your iron pills.  To prevent trouble pooping (constipation), eat fiber or take medicine (stool softener) as told by your doctor. Eating and drinking   Talk with your doctor before changing the foods you eat. He or she may tell you to eat foods that have a lot of iron, such as: ? Liver. ? Lowfat (lean) beef. ? Breads and cereals that have iron added to them (fortified breads and cereals). ? Eggs. ? Dried fruit. ? Dark green, leafy vegetables.  Drink enough fluid to keep your pee (urine) clear or pale yellow.  Eat fresh fruits and vegetables that are high in vitamin C. They help your body to use iron. Foods with a lot of vitamin C include: ? Oranges. ? Peppers. ? Tomatoes. ? Mangoes. General instructions  Return to your normal activities as told by your doctor. Ask your doctor what activities are safe for you.  Keep yourself clean, and keep things clean around you (your surroundings). Anemia can make you get sick more easily.  Keep all follow-up visits as told by your doctor. This is important. Contact a doctor if:  You feel sick to your stomach (nauseous).  You throw up (vomit).  You feel  weak.  You are sweating for no clear reason.  You have trouble pooping, such as: ? Pooping (having a bowel movement) less than 3 times a week. ? Straining to poop. ? Having poop that is hard, dry, or larger than normal. ? Feeling full or bloated. ? Pain in the lower belly. ? Not feeling better after pooping. Get help right away if:  You pass out (faint). If this happens, do not drive yourself to the hospital. Call your local emergency services (911 in the [place]  You have chest pain.  You have shortness of breath that: ? Is very bad. ? Gets worse with physical activity.  You have a fast heartbeat.  You get light-headed when getting up from sitting or lying down. This information is not intended to replace advice given to you by your health care provider. Make sure you discuss any questions you have with your health care provider. Document Released: 03/13/2010 Document Revised: 10/29/2015 Document Reviewed: 10/29/2015 Elsevier Interactive Patient Education  2019 Elsevier Inc.  Ferumoxytol injection What is this medicine? FERUMOXYTOL is an iron complex. Iron is used to make healthy red blood cells, which carry oxygen and nutrients throughout the body. This medicine is used to treat iron deficiency anemia. This medicine may be used for other purposes; ask your health care provider or pharmacist if you have questions. COMMON BRAND NAME(S): Feraheme What should I tell my health care provider before I take this medicine? They need to know   if you have any of these conditions: -anemia not caused by low iron levels -high levels of iron in the blood -magnetic resonance imaging (MRI) test scheduled -an unusual or allergic reaction to iron, other medicines, foods, dyes, or preservatives -pregnant or trying to get pregnant -breast-feeding How should I use this medicine? This medicine is for injection into a vein. It is given by a health care professional in a hospital or clinic setting.  Talk to your pediatrician regarding the use of this medicine in children. Special care may be needed. Overdosage: If you think you have taken too much of this medicine contact a poison control center or emergency room at once. NOTE: This medicine is only for you. Do not share this medicine with others. What if I miss a dose? It is important not to miss your dose. Call your doctor or health care professional if you are unable to keep an appointment. What may interact with this medicine? This medicine may interact with the following medications: -other iron products This list may not describe all possible interactions. Give your health care provider a list of all the medicines, herbs, non-prescription drugs, or dietary supplements you use. Also tell them if you smoke, drink alcohol, or use illegal drugs. Some items may interact with your medicine. What should I watch for while using this medicine? Visit your doctor or healthcare professional regularly. Tell your doctor or healthcare professional if your symptoms do not start to get better or if they get worse. You may need blood work done while you are taking this medicine. You may need to follow a special diet. Talk to your doctor. Foods that contain iron include: whole grains/cereals, dried fruits, beans, or peas, leafy green vegetables, and organ meats (liver, kidney). What side effects may I notice from receiving this medicine? Side effects that you should report to your doctor or health care professional as soon as possible: -allergic reactions like skin rash, itching or hives, swelling of the face, lips, or tongue -breathing problems -changes in blood pressure -feeling faint or lightheaded, falls -fever or chills -flushing, sweating, or hot feelings -swelling of the ankles or feet Side effects that usually do not require medical attention (report to your doctor or health care professional if they continue or are bothersome): -diarrhea  -headache -nausea, vomiting -stomach pain This list may not describe all possible side effects. Call your doctor for medical advice about side effects. You may report side effects to FDA at 1-800-FDA-1088. Where should I keep my medicine? This drug is given in a hospital or clinic and will not be stored at home. NOTE: This sheet is a summary. It may not cover all possible information. If you have questions about this medicine, talk to your doctor, pharmacist, or health care provider.  2019 Elsevier/Gold Standard (2016-03-29 20:21:10)   

## 2018-02-20 ENCOUNTER — Encounter (HOSPITAL_COMMUNITY): Payer: BLUE CROSS/BLUE SHIELD

## 2018-02-23 ENCOUNTER — Ambulatory Visit (HOSPITAL_COMMUNITY): Payer: BLUE CROSS/BLUE SHIELD

## 2018-02-24 ENCOUNTER — Ambulatory Visit: Payer: BLUE CROSS/BLUE SHIELD

## 2018-02-27 ENCOUNTER — Ambulatory Visit (HOSPITAL_COMMUNITY)
Admission: RE | Admit: 2018-02-27 | Discharge: 2018-02-27 | Disposition: A | Discharge status: Home / Self Care | Payer: BLUE CROSS/BLUE SHIELD | Source: Ambulatory Visit | Attending: Family Medicine | Admitting: Family Medicine

## 2018-02-27 ENCOUNTER — Telehealth: Payer: Self-pay | Admitting: *Deleted

## 2018-02-27 DIAGNOSIS — D500 Iron deficiency anemia secondary to blood loss (chronic): Secondary | ICD-10-CM | POA: Diagnosis not present

## 2018-02-27 MED ORDER — SODIUM CHLORIDE 0.9 % IV SOLN
510.0000 mg | Freq: Once | INTRAVENOUS | Status: AC
  Administered 2018-02-27: 510 mg via INTRAVENOUS
  Filled 2018-02-27: qty 17

## 2018-02-27 MED ORDER — SODIUM CHLORIDE 0.9 % IV SOLN
INTRAVENOUS | Status: DC | PRN
  Administered 2018-02-27: 250 mL via INTRAVENOUS

## 2018-02-27 NOTE — Progress Notes (Signed)
PATIENT CARE CENTER NOTE  Diagnosis: Iron Deficiency Anemia    Provider: Dr. Mathis Dad Higgs   Procedure: IV Feraheme    Note: Patient received Feraheme infusion. Tolerated well with no adverse reaction. Monitored patient for 30 minutes post-infusion. Vital signs stable. Discharge instructions given. Patient alert, oriented and ambulatory at discharge.

## 2018-02-27 NOTE — Discharge Instructions (Signed)

## 2018-02-27 NOTE — Telephone Encounter (Signed)
"  Cecilee D Taunton's aunt, Aerin Delany [phone]) Trying to locate my niece and figure out what's going on.  I came with her Saturday and we were leaving within an hour and a half.  Today'sappointment began at 9:00 am.  What is going on?"   Treated today at [facility] room 2 with today's AVS time 11:02 am.  Shared information with this call.  Continued connection without audible sound from aunt. Encounter closed.  No return call from aunt.

## 2018-03-03 ENCOUNTER — Ambulatory Visit: Payer: BLUE CROSS/BLUE SHIELD | Admitting: Hematology and Oncology

## 2018-03-03 ENCOUNTER — Ambulatory Visit (HOSPITAL_COMMUNITY)
Admission: RE | Admit: 2018-03-03 | Discharge: 2018-03-03 | Disposition: A | Discharge status: Home / Self Care | Payer: BLUE CROSS/BLUE SHIELD | Source: Ambulatory Visit | Attending: Internal Medicine | Admitting: Internal Medicine

## 2018-03-03 ENCOUNTER — Ambulatory Visit (HOSPITAL_BASED_OUTPATIENT_CLINIC_OR_DEPARTMENT_OTHER)
Admission: RE | Admit: 2018-03-03 | Discharge: 2018-03-03 | Disposition: A | Discharge status: Home / Self Care | Payer: BLUE CROSS/BLUE SHIELD | Source: Ambulatory Visit | Attending: Internal Medicine | Admitting: Internal Medicine

## 2018-03-03 DIAGNOSIS — D500 Iron deficiency anemia secondary to blood loss (chronic): Secondary | ICD-10-CM | POA: Diagnosis not present

## 2018-03-03 DIAGNOSIS — I2609 Other pulmonary embolism with acute cor pulmonale: Secondary | ICD-10-CM | POA: Diagnosis not present

## 2018-03-03 DIAGNOSIS — I82533 Chronic embolism and thrombosis of popliteal vein, bilateral: Secondary | ICD-10-CM

## 2018-03-03 DIAGNOSIS — I2699 Other pulmonary embolism without acute cor pulmonale: Secondary | ICD-10-CM | POA: Diagnosis not present

## 2018-03-03 MED ORDER — SODIUM CHLORIDE (PF) 0.9 % IJ SOLN
INTRAMUSCULAR | Status: AC
  Filled 2018-03-03: qty 50

## 2018-03-03 MED ORDER — IOPAMIDOL (ISOVUE-370) INJECTION 76%
INTRAVENOUS | Status: AC
  Filled 2018-03-03: qty 100

## 2018-03-03 MED ORDER — IOPAMIDOL (ISOVUE-370) INJECTION 76%
100.0000 mL | Freq: Once | INTRAVENOUS | Status: AC | PRN
  Administered 2018-03-03: 100 mL via INTRAVENOUS

## 2018-03-03 NOTE — Progress Notes (Signed)
*  Preliminary Results* Bilateral lower extremity venous duplex completed. Bilateral lower extremities are negative for deep vein thrombosis. There is no evidence of Baker's cyst bilaterally.  03/03/2018 9:49 AM Maudry Mayhew, MHA, RVT, RDCS, RDMS

## 2018-03-10 ENCOUNTER — Ambulatory Visit (HOSPITAL_COMMUNITY): Payer: BLUE CROSS/BLUE SHIELD

## 2018-03-16 ENCOUNTER — Inpatient Hospital Stay (HOSPITAL_BASED_OUTPATIENT_CLINIC_OR_DEPARTMENT_OTHER): Payer: BLUE CROSS/BLUE SHIELD | Admitting: Internal Medicine

## 2018-03-16 ENCOUNTER — Encounter: Payer: Self-pay | Admitting: Internal Medicine

## 2018-03-16 ENCOUNTER — Inpatient Hospital Stay: Payer: BLUE CROSS/BLUE SHIELD | Attending: Hematology and Oncology

## 2018-03-16 VITALS — BP 128/74 | HR 74 | Temp 98.0°F | Resp 18 | Ht 65.0 in | Wt 222.8 lb

## 2018-03-16 DIAGNOSIS — Z7901 Long term (current) use of anticoagulants: Secondary | ICD-10-CM

## 2018-03-16 DIAGNOSIS — I2609 Other pulmonary embolism with acute cor pulmonale: Secondary | ICD-10-CM

## 2018-03-16 DIAGNOSIS — Z86718 Personal history of other venous thrombosis and embolism: Secondary | ICD-10-CM

## 2018-03-16 DIAGNOSIS — N920 Excessive and frequent menstruation with regular cycle: Secondary | ICD-10-CM | POA: Diagnosis not present

## 2018-03-16 DIAGNOSIS — D72819 Decreased white blood cell count, unspecified: Secondary | ICD-10-CM | POA: Insufficient documentation

## 2018-03-16 DIAGNOSIS — Z79899 Other long term (current) drug therapy: Secondary | ICD-10-CM

## 2018-03-16 DIAGNOSIS — Z86711 Personal history of pulmonary embolism: Secondary | ICD-10-CM | POA: Insufficient documentation

## 2018-03-16 DIAGNOSIS — D500 Iron deficiency anemia secondary to blood loss (chronic): Secondary | ICD-10-CM | POA: Diagnosis not present

## 2018-03-16 DIAGNOSIS — N200 Calculus of kidney: Secondary | ICD-10-CM | POA: Insufficient documentation

## 2018-03-16 DIAGNOSIS — I82533 Chronic embolism and thrombosis of popliteal vein, bilateral: Secondary | ICD-10-CM

## 2018-03-16 LAB — CBC WITH DIFFERENTIAL/PLATELET
Abs Immature Granulocytes: 0 10*3/uL (ref 0.00–0.07)
Basophils Absolute: 0 10*3/uL (ref 0.0–0.1)
Basophils Relative: 1 %
Eosinophils Absolute: 0.1 10*3/uL (ref 0.0–0.5)
Eosinophils Relative: 3 %
HCT: 37.3 % (ref 36.0–46.0)
Hemoglobin: 10.8 g/dL — ABNORMAL LOW (ref 12.0–15.0)
Immature Granulocytes: 0 %
Lymphocytes Relative: 28 %
Lymphs Abs: 0.9 10*3/uL (ref 0.7–4.0)
MCH: 26.3 pg (ref 26.0–34.0)
MCHC: 29 g/dL — ABNORMAL LOW (ref 30.0–36.0)
MCV: 90.8 fL (ref 80.0–100.0)
Monocytes Absolute: 0.3 10*3/uL (ref 0.1–1.0)
Monocytes Relative: 9 %
Neutro Abs: 1.8 10*3/uL (ref 1.7–7.7)
Neutrophils Relative %: 59 %
Platelets: 266 10*3/uL (ref 150–400)
RBC: 4.11 MIL/uL (ref 3.87–5.11)
RDW: 19.7 % — ABNORMAL HIGH (ref 11.5–15.5)
WBC: 3 10*3/uL — ABNORMAL LOW (ref 4.0–10.5)
nRBC: 0 % (ref 0.0–0.2)

## 2018-03-16 LAB — COMPREHENSIVE METABOLIC PANEL
ALT: 11 U/L (ref 0–44)
AST: 11 U/L — ABNORMAL LOW (ref 15–41)
Albumin: 3.7 g/dL (ref 3.5–5.0)
Alkaline Phosphatase: 84 U/L (ref 38–126)
Anion gap: 8 (ref 5–15)
BUN: 12 mg/dL (ref 6–20)
CO2: 25 mmol/L (ref 22–32)
Calcium: 9.6 mg/dL (ref 8.9–10.3)
Chloride: 108 mmol/L (ref 98–111)
Creatinine, Ser: 1.06 mg/dL — ABNORMAL HIGH (ref 0.44–1.00)
GFR calc Af Amer: >60 mL/min (ref >60)
GFR calc non Af Amer: >60 mL/min (ref >60)
Glucose, Bld: 120 mg/dL — ABNORMAL HIGH (ref 70–99)
Potassium: 4 mmol/L (ref 3.5–5.1)
Sodium: 141 mmol/L (ref 135–145)
Total Bilirubin: <0.2 mg/dL — ABNORMAL LOW (ref 0.3–1.2)
Total Protein: 7 g/dL (ref 6.5–8.1)

## 2018-03-16 LAB — LACTATE DEHYDROGENASE: LDH: 194 U/L — ABNORMAL HIGH (ref 98–192)

## 2018-03-16 LAB — FERRITIN: Ferritin: 144 ng/mL (ref 11–307)

## 2018-03-16 MED ORDER — RIVAROXABAN 20 MG PO TABS: 20.0000 mg | ORAL_TABLET | Freq: Every day | ORAL | 6 refills | Status: DC
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
## 2018-03-17 ENCOUNTER — Telehealth: Payer: Self-pay | Admitting: *Deleted

## 2018-03-17 NOTE — Telephone Encounter (Signed)
Spoke with pt and informed her re:  Chemistry and Iron levels WNL as per Dr. Walden Field' instructions.  Pt voiced understanding and aware of next office visit in May 2020.

## 2018-04-19 ENCOUNTER — Encounter: Payer: Self-pay | Admitting: Gastroenterology

## 2018-04-25 DIAGNOSIS — M25562 Pain in left knee: Secondary | ICD-10-CM | POA: Diagnosis not present

## 2018-05-18 ENCOUNTER — Ambulatory Visit: Payer: BLUE CROSS/BLUE SHIELD | Admitting: Gastroenterology

## 2018-06-20 DIAGNOSIS — M25562 Pain in left knee: Secondary | ICD-10-CM | POA: Diagnosis not present

## 2018-07-13 ENCOUNTER — Inpatient Hospital Stay: Payer: BC Managed Care – PPO | Attending: Internal Medicine | Admitting: Internal Medicine

## 2018-07-13 ENCOUNTER — Other Ambulatory Visit: Payer: Self-pay

## 2018-07-13 ENCOUNTER — Inpatient Hospital Stay: Payer: BC Managed Care – PPO

## 2018-07-13 ENCOUNTER — Other Ambulatory Visit: Payer: Self-pay | Admitting: Internal Medicine

## 2018-07-13 ENCOUNTER — Encounter: Payer: Self-pay | Admitting: Internal Medicine

## 2018-07-13 ENCOUNTER — Telehealth: Payer: Self-pay | Admitting: Internal Medicine

## 2018-07-13 ENCOUNTER — Telehealth: Payer: Self-pay | Admitting: *Deleted

## 2018-07-13 VITALS — BP 147/84 | HR 96 | Temp 98.2°F | Resp 18 | Ht 65.0 in | Wt 238.3 lb

## 2018-07-13 DIAGNOSIS — Z79899 Other long term (current) drug therapy: Secondary | ICD-10-CM | POA: Insufficient documentation

## 2018-07-13 DIAGNOSIS — N920 Excessive and frequent menstruation with regular cycle: Secondary | ICD-10-CM | POA: Insufficient documentation

## 2018-07-13 DIAGNOSIS — Z7901 Long term (current) use of anticoagulants: Secondary | ICD-10-CM | POA: Insufficient documentation

## 2018-07-13 DIAGNOSIS — D500 Iron deficiency anemia secondary to blood loss (chronic): Secondary | ICD-10-CM

## 2018-07-13 DIAGNOSIS — D72819 Decreased white blood cell count, unspecified: Secondary | ICD-10-CM | POA: Insufficient documentation

## 2018-07-13 DIAGNOSIS — Z86711 Personal history of pulmonary embolism: Secondary | ICD-10-CM | POA: Insufficient documentation

## 2018-07-13 DIAGNOSIS — N200 Calculus of kidney: Secondary | ICD-10-CM | POA: Insufficient documentation

## 2018-07-13 DIAGNOSIS — Z86718 Personal history of other venous thrombosis and embolism: Secondary | ICD-10-CM | POA: Insufficient documentation

## 2018-07-13 LAB — CBC WITH DIFFERENTIAL/PLATELET
Abs Immature Granulocytes: 0 10*3/uL (ref 0.00–0.07)
Basophils Absolute: 0 10*3/uL (ref 0.0–0.1)
Basophils Relative: 1 %
Eosinophils Absolute: 0.1 10*3/uL (ref 0.0–0.5)
Eosinophils Relative: 3 %
HCT: 37 % (ref 36.0–46.0)
Hemoglobin: 11.3 g/dL — ABNORMAL LOW (ref 12.0–15.0)
Immature Granulocytes: 0 %
Lymphocytes Relative: 35 %
Lymphs Abs: 1.5 10*3/uL (ref 0.7–4.0)
MCH: 27.5 pg (ref 26.0–34.0)
MCHC: 30.5 g/dL (ref 30.0–36.0)
MCV: 90 fL (ref 80.0–100.0)
Monocytes Absolute: 0.4 10*3/uL (ref 0.1–1.0)
Monocytes Relative: 10 %
Neutro Abs: 2.2 10*3/uL (ref 1.7–7.7)
Neutrophils Relative %: 51 %
Platelets: 244 10*3/uL (ref 150–400)
RBC: 4.11 MIL/uL (ref 3.87–5.11)
RDW: 13.6 % (ref 11.5–15.5)
WBC: 4.2 10*3/uL (ref 4.0–10.5)
nRBC: 0 % (ref 0.0–0.2)

## 2018-07-13 LAB — COMPREHENSIVE METABOLIC PANEL
ALT: 57 U/L — ABNORMAL HIGH (ref 0–44)
AST: 27 U/L (ref 15–41)
Albumin: 3.6 g/dL (ref 3.5–5.0)
Alkaline Phosphatase: 109 U/L (ref 38–126)
Anion gap: 9 (ref 5–15)
BUN: 10 mg/dL (ref 6–20)
CO2: 23 mmol/L (ref 22–32)
Calcium: 8.9 mg/dL (ref 8.9–10.3)
Chloride: 106 mmol/L (ref 98–111)
Creatinine, Ser: 0.97 mg/dL (ref 0.44–1.00)
GFR calc Af Amer: >60 mL/min (ref >60)
GFR calc non Af Amer: >60 mL/min (ref >60)
Glucose, Bld: 143 mg/dL — ABNORMAL HIGH (ref 70–99)
Potassium: 3.9 mmol/L (ref 3.5–5.1)
Sodium: 138 mmol/L (ref 135–145)
Total Bilirubin: 0.2 mg/dL — ABNORMAL LOW (ref 0.3–1.2)
Total Protein: 7 g/dL (ref 6.5–8.1)

## 2018-07-13 LAB — LACTATE DEHYDROGENASE: LDH: 248 U/L — ABNORMAL HIGH (ref 98–192)

## 2018-07-13 LAB — FERRITIN: Ferritin: 26 ng/mL (ref 11–307)
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
## 2018-07-13 NOTE — Telephone Encounter (Signed)
TCT patient regarding her lab results from this morning. Spoke with patient. Per Dr. Walden Field, pt's ferritin is low and has recommended IV fereheme x 2 doses, a week apart. Pt voiced understanding and is agreeable to this. Education provided regarding these infusions. Patient voiced understanding. Advised that a scheduler would call with appt dates and times.  Scheduling message sent.

## 2018-07-13 NOTE — Telephone Encounter (Signed)
Left message re 5/29 and 6/5 appointments scheduled per 5/21 schedule message. Schedule mailed.

## 2018-07-14 ENCOUNTER — Telehealth: Payer: Self-pay | Admitting: Internal Medicine

## 2018-07-14 NOTE — Telephone Encounter (Signed)
Called regarding schedule °

## 2018-07-21 ENCOUNTER — Inpatient Hospital Stay: Payer: BC Managed Care – PPO

## 2018-07-21 ENCOUNTER — Other Ambulatory Visit: Payer: Self-pay

## 2018-07-21 VITALS — BP 129/91 | HR 66 | Temp 97.1°F | Resp 16

## 2018-07-21 DIAGNOSIS — Z86718 Personal history of other venous thrombosis and embolism: Secondary | ICD-10-CM | POA: Diagnosis not present

## 2018-07-21 DIAGNOSIS — Z7901 Long term (current) use of anticoagulants: Secondary | ICD-10-CM | POA: Diagnosis not present

## 2018-07-21 DIAGNOSIS — D500 Iron deficiency anemia secondary to blood loss (chronic): Secondary | ICD-10-CM | POA: Diagnosis not present

## 2018-07-21 DIAGNOSIS — N200 Calculus of kidney: Secondary | ICD-10-CM | POA: Diagnosis not present

## 2018-07-21 DIAGNOSIS — D72819 Decreased white blood cell count, unspecified: Secondary | ICD-10-CM | POA: Diagnosis not present

## 2018-07-21 DIAGNOSIS — Z79899 Other long term (current) drug therapy: Secondary | ICD-10-CM | POA: Diagnosis not present

## 2018-07-21 DIAGNOSIS — N920 Excessive and frequent menstruation with regular cycle: Secondary | ICD-10-CM | POA: Diagnosis not present

## 2018-07-21 DIAGNOSIS — Z86711 Personal history of pulmonary embolism: Secondary | ICD-10-CM | POA: Diagnosis not present

## 2018-07-21 MED ORDER — SODIUM CHLORIDE 0.9 % IV SOLN
510.0000 mg | Freq: Once | INTRAVENOUS | Status: AC
  Administered 2018-07-21: 09:00:00 510 mg via INTRAVENOUS
  Filled 2018-07-21: qty 510

## 2018-07-21 MED ORDER — SODIUM CHLORIDE 0.9 % IV SOLN
Freq: Once | INTRAVENOUS | Status: AC
  Administered 2018-07-21: 09:00:00 via INTRAVENOUS
  Filled 2018-07-21: qty 250

## 2018-07-21 NOTE — Patient Instructions (Signed)

## 2018-07-28 ENCOUNTER — Other Ambulatory Visit: Payer: Self-pay

## 2018-07-28 ENCOUNTER — Inpatient Hospital Stay: Payer: BC Managed Care – PPO | Attending: Internal Medicine

## 2018-07-28 ENCOUNTER — Encounter: Payer: Self-pay | Admitting: Gastroenterology

## 2018-07-28 ENCOUNTER — Telehealth: Payer: Self-pay | Admitting: Emergency Medicine

## 2018-07-28 ENCOUNTER — Ambulatory Visit (INDEPENDENT_AMBULATORY_CARE_PROVIDER_SITE_OTHER): Payer: BC Managed Care – PPO | Admitting: Gastroenterology

## 2018-07-28 VITALS — Ht 65.0 in | Wt 238.0 lb

## 2018-07-28 VITALS — BP 118/78 | HR 77 | Temp 98.4°F | Resp 16

## 2018-07-28 DIAGNOSIS — N920 Excessive and frequent menstruation with regular cycle: Secondary | ICD-10-CM | POA: Insufficient documentation

## 2018-07-28 DIAGNOSIS — D500 Iron deficiency anemia secondary to blood loss (chronic): Secondary | ICD-10-CM | POA: Insufficient documentation

## 2018-07-28 DIAGNOSIS — D509 Iron deficiency anemia, unspecified: Secondary | ICD-10-CM | POA: Diagnosis not present

## 2018-07-28 MED ORDER — SODIUM CHLORIDE 0.9 % IV SOLN
Freq: Once | INTRAVENOUS | Status: AC
  Administered 2018-07-28: 09:00:00 via INTRAVENOUS
  Filled 2018-07-28: qty 250

## 2018-07-28 MED ORDER — SODIUM CHLORIDE 0.9 % IV SOLN
510.0000 mg | Freq: Once | INTRAVENOUS | Status: AC
  Administered 2018-07-28: 510 mg via INTRAVENOUS
  Filled 2018-07-28: qty 17
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
## 2018-07-28 NOTE — Patient Instructions (Signed)
I have recommended an EGD and colonoscopy to evaluate your iron deficiency anemia.  Thank you for your patience with me and our technology today! Please stay home, safe, and healthy. I look forward to meeting you in person in the future.

## 2018-07-28 NOTE — Patient Instructions (Signed)

## 2018-07-28 NOTE — Telephone Encounter (Signed)
   Marissa [name] May 08, 1970 [id number]  Dear Dr. Walden Field:  We have scheduled the above named patient for a(n) colonoscopy procedure. Our records show that (s)he is on anticoagulation therapy.  Please advise as to whether the patient may come off their therapy of Xarelto 2 days prior to their procedure which is scheduled for TBD.  Please route your response to Tinnie Gens, CMA or fax response to [phone].  Sincerely,    [facility]

## 2018-08-07 ENCOUNTER — Encounter: Payer: Self-pay | Admitting: Gastroenterology

## 2018-08-16 NOTE — Telephone Encounter (Signed)
Noted. Patient not ready to schedule at this time will call back to schedule.

## 2018-09-06 ENCOUNTER — Other Ambulatory Visit: Payer: BLUE CROSS/BLUE SHIELD

## 2018-09-06 ENCOUNTER — Ambulatory Visit: Payer: BLUE CROSS/BLUE SHIELD | Admitting: Internal Medicine

## 2018-09-08 ENCOUNTER — Other Ambulatory Visit: Payer: Self-pay

## 2018-09-08 ENCOUNTER — Inpatient Hospital Stay: Payer: BC Managed Care – PPO | Attending: Internal Medicine

## 2018-09-08 ENCOUNTER — Inpatient Hospital Stay (HOSPITAL_BASED_OUTPATIENT_CLINIC_OR_DEPARTMENT_OTHER): Payer: BC Managed Care – PPO | Admitting: Internal Medicine

## 2018-09-08 VITALS — BP 126/79 | HR 89 | Temp 98.2°F | Resp 18 | Ht 65.0 in | Wt 237.7 lb

## 2018-09-08 DIAGNOSIS — N920 Excessive and frequent menstruation with regular cycle: Secondary | ICD-10-CM

## 2018-09-08 DIAGNOSIS — D500 Iron deficiency anemia secondary to blood loss (chronic): Secondary | ICD-10-CM

## 2018-09-08 DIAGNOSIS — N179 Acute kidney failure, unspecified: Secondary | ICD-10-CM | POA: Diagnosis not present

## 2018-09-08 DIAGNOSIS — Z86711 Personal history of pulmonary embolism: Secondary | ICD-10-CM | POA: Insufficient documentation

## 2018-09-08 DIAGNOSIS — Z79899 Other long term (current) drug therapy: Secondary | ICD-10-CM | POA: Insufficient documentation

## 2018-09-08 DIAGNOSIS — Z7901 Long term (current) use of anticoagulants: Secondary | ICD-10-CM | POA: Diagnosis not present

## 2018-09-08 DIAGNOSIS — D72819 Decreased white blood cell count, unspecified: Secondary | ICD-10-CM | POA: Insufficient documentation

## 2018-09-08 DIAGNOSIS — Z86718 Personal history of other venous thrombosis and embolism: Secondary | ICD-10-CM

## 2018-09-08 LAB — CMP (CANCER CENTER ONLY)
ALT: 28 U/L (ref 0–44)
AST: 19 U/L (ref 15–41)
Albumin: 3.7 g/dL (ref 3.5–5.0)
Alkaline Phosphatase: 98 U/L (ref 38–126)
Anion gap: 10 (ref 5–15)
BUN: 11 mg/dL (ref 6–20)
CO2: 25 mmol/L (ref 22–32)
Calcium: 8.9 mg/dL (ref 8.9–10.3)
Chloride: 106 mmol/L (ref 98–111)
Creatinine: 0.93 mg/dL (ref 0.44–1.00)
GFR, Est AFR Am: >60 mL/min (ref >60)
GFR, Estimated: >60 mL/min (ref >60)
Glucose, Bld: 155 mg/dL — ABNORMAL HIGH (ref 70–99)
Potassium: 3.9 mmol/L (ref 3.5–5.1)
Sodium: 141 mmol/L (ref 135–145)
Total Bilirubin: 0.3 mg/dL (ref 0.3–1.2)
Total Protein: 7 g/dL (ref 6.5–8.1)

## 2018-09-08 LAB — CBC WITH DIFFERENTIAL (CANCER CENTER ONLY)
Abs Immature Granulocytes: 0 10*3/uL (ref 0.00–0.07)
Basophils Absolute: 0 10*3/uL (ref 0.0–0.1)
Basophils Relative: 0 %
Eosinophils Absolute: 0.1 10*3/uL (ref 0.0–0.5)
Eosinophils Relative: 3 %
HCT: 37.5 % (ref 36.0–46.0)
Hemoglobin: 11.8 g/dL — ABNORMAL LOW (ref 12.0–15.0)
Immature Granulocytes: 0 %
Lymphocytes Relative: 34 %
Lymphs Abs: 1.1 10*3/uL (ref 0.7–4.0)
MCH: 28.3 pg (ref 26.0–34.0)
MCHC: 31.5 g/dL (ref 30.0–36.0)
MCV: 89.9 fL (ref 80.0–100.0)
Monocytes Absolute: 0.3 10*3/uL (ref 0.1–1.0)
Monocytes Relative: 9 %
Neutro Abs: 1.7 10*3/uL (ref 1.7–7.7)
Neutrophils Relative %: 54 %
Platelet Count: 222 10*3/uL (ref 150–400)
RBC: 4.17 MIL/uL (ref 3.87–5.11)
RDW: 13.2 % (ref 11.5–15.5)
WBC Count: 3.2 10*3/uL — ABNORMAL LOW (ref 4.0–10.5)
nRBC: 0 % (ref 0.0–0.2)

## 2018-09-08 LAB — LACTATE DEHYDROGENASE: LDH: 196 U/L — ABNORMAL HIGH (ref 98–192)

## 2018-09-08 LAB — FERRITIN: Ferritin: 265 ng/mL (ref 11–307)

## 2018-09-08 MED ORDER — RIVAROXABAN 20 MG PO TABS: 20.0000 mg | ORAL_TABLET | Freq: Every day | ORAL | 3 refills | Status: DC
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
## 2018-09-29 ENCOUNTER — Other Ambulatory Visit: Payer: Self-pay | Admitting: Family Medicine

## 2018-09-29 DIAGNOSIS — Z1231 Encounter for screening mammogram for malignant neoplasm of breast: Secondary | ICD-10-CM

## 2018-11-07 ENCOUNTER — Other Ambulatory Visit: Payer: Self-pay

## 2018-11-08 ENCOUNTER — Encounter: Payer: Self-pay | Admitting: Women's Health

## 2018-11-08 ENCOUNTER — Ambulatory Visit (INDEPENDENT_AMBULATORY_CARE_PROVIDER_SITE_OTHER): Payer: BC Managed Care – PPO | Admitting: Women's Health

## 2018-11-08 VITALS — BP 122/78 | Ht 65.0 in | Wt 240.0 lb

## 2018-11-08 DIAGNOSIS — Z01419 Encounter for gynecological examination (general) (routine) without abnormal findings: Secondary | ICD-10-CM

## 2018-11-08 DIAGNOSIS — Z23 Encounter for immunization: Secondary | ICD-10-CM

## 2018-11-08 DIAGNOSIS — Z113 Encounter for screening for infections with a predominantly sexual mode of transmission: Secondary | ICD-10-CM | POA: Diagnosis not present

## 2018-11-08 MED ORDER — NYSTATIN-TRIAMCINOLONE 100000-0.1 UNIT/GM-% EX OINT: 1.0000 "application " | TOPICAL_OINTMENT | Freq: Two times a day (BID) | CUTANEOUS | 0 refills | Status: DC

## 2018-11-08 NOTE — Patient Instructions (Signed)
Health Maintenance, Female Adopting a healthy lifestyle and getting preventive care are important in promoting health and wellness. Ask your health care provider about:  The right schedule for you to have regular tests and exams.  Things you can do on your own to prevent diseases and keep yourself healthy. What should I know about diet, weight, and exercise? Eat a healthy diet   Eat a diet that includes plenty of vegetables, fruits, low-fat dairy products, and lean protein.  Do not eat a lot of foods that are high in solid fats, added sugars, or sodium. Maintain a healthy weight Body mass index (BMI) is used to identify weight problems. It estimates body fat based on height and weight. Your health care provider can help determine your BMI and help you achieve or maintain a healthy weight. Get regular exercise Get regular exercise. This is one of the most important things you can do for your health. Most adults should:  Exercise for at least 150 minutes each week. The exercise should increase your heart rate and make you sweat (moderate-intensity exercise).  Do strengthening exercises at least twice a week. This is in addition to the moderate-intensity exercise.  Spend less time sitting. Even light physical activity can be beneficial. Watch cholesterol and blood lipids Have your blood tested for lipids and cholesterol at 49 years of age, then have this test every 5 years. Have your cholesterol levels checked more often if:  Your lipid or cholesterol levels are high.  You are older than 49 years of age.  You are at high risk for heart disease. What should I know about cancer screening? Depending on your health history and family history, you may need to have cancer screening at various ages. This may include screening for:  Breast cancer.  Cervical cancer.  Colorectal cancer.  Skin cancer.  Lung cancer. What should I know about heart disease, diabetes, and high blood  pressure? Blood pressure and heart disease  High blood pressure causes heart disease and increases the risk of stroke. This is more likely to develop in people who have high blood pressure readings, are of African descent, or are overweight.  Have your blood pressure checked: ? Every 3-5 years if you are 18-39 years of age. ? Every year if you are 40 years old or older. Diabetes Have regular diabetes screenings. This checks your fasting blood sugar level. Have the screening done:  Once every three years after age 40 if you are at a normal weight and have a low risk for diabetes.  More often and at a younger age if you are overweight or have a high risk for diabetes. What should I know about preventing infection? Hepatitis B If you have a higher risk for hepatitis B, you should be screened for this virus. Talk with your health care provider to find out if you are at risk for hepatitis B infection. Hepatitis C Testing is recommended for:  Everyone born from 1945 through 1965.  Anyone with known risk factors for hepatitis C. Sexually transmitted infections (STIs)  Get screened for STIs, including gonorrhea and chlamydia, if: ? You are sexually active and are younger than 49 years of age. ? You are older than 49 years of age and your health care provider tells you that you are at risk for this type of infection. ? Your sexual activity has changed since you were last screened, and you are at increased risk for chlamydia or gonorrhea. Ask your health care provider if   you are at risk.  Ask your health care provider about whether you are at high risk for HIV. Your health care provider may recommend a prescription medicine to help prevent HIV infection. If you choose to take medicine to prevent HIV, you should first get tested for HIV. You should then be tested every 3 months for as long as you are taking the medicine. Pregnancy  If you are about to stop having your period (premenopausal) and  you may become pregnant, seek counseling before you get pregnant.  Take 400 to 800 micrograms (mcg) of folic acid every day if you become pregnant.  Ask for birth control (contraception) if you want to prevent pregnancy. Osteoporosis and menopause Osteoporosis is a disease in which the bones lose minerals and strength with aging. This can result in bone fractures. If you are 65 years old or older, or if you are at risk for osteoporosis and fractures, ask your health care provider if you should:  Be screened for bone loss.  Take a calcium or vitamin D supplement to lower your risk of fractures.  Be given hormone replacement therapy (HRT) to treat symptoms of menopause. Follow these instructions at home: Lifestyle  Do not use any products that contain nicotine or tobacco, such as cigarettes, e-cigarettes, and chewing tobacco. If you need help quitting, ask your health care provider.  Do not use street drugs.  Do not share needles.  Ask your health care provider for help if you need support or information about quitting drugs. Alcohol use  Do not drink alcohol if: ? Your health care provider tells you not to drink. ? You are pregnant, may be pregnant, or are planning to become pregnant.  If you drink alcohol: ? Limit how much you use to 0-1 drink a day. ? Limit intake if you are breastfeeding.  Be aware of how much alcohol is in your drink. In the [place] one drink equals one 12 oz bottle of beer (355 mL), one 5 oz glass of wine (148 mL), or one 1 oz glass of hard liquor (44 mL). General instructions  Schedule regular health, dental, and eye exams.  Stay current with your vaccines.  Tell your health care provider if: ? You often feel depressed. ? You have ever been abused or do not feel safe at home. Summary  Adopting a healthy lifestyle and getting preventive care are important in promoting health and wellness.  Follow your health care provider's instructions about healthy  diet, exercising, and getting tested or screened for diseases.  Follow your health care provider's instructions on monitoring your cholesterol and blood pressure. This information is not intended to replace advice given to you by your health care provider. Make sure you discuss any questions you have with your health care provider. Document Released: 08/24/2010 Document Revised: 02/01/2018 Document Reviewed: 02/01/2018 Elsevier Patient Education  2020 Elsevier Inc.  

## 2018-11-08 NOTE — Progress Notes (Signed)
Marissa [name] Dec 02, 1970 [id number]    History:    Presents for annual exam.monthly cycles until this year,  7 day cycle in January and June. 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 Normal mammograms. 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 2019 Rt. Kidney stone. Complaint of redness and irritation under lower abd. Skin fold, no open lesions or drainage. Concerned about weight she has gained, but has increased dietary intake and decreased activity since working from home due to [employer]. History of iron deficiency anemia, Iron transfusions January and June. New female partner for past year,  condoms and vasectomy.   Past medical history, past surgical history, family history and social history were all reviewed and documented in the EPIC chart. Works from home for [employer].  Mother hypertension deceased, father diabetes  ROS:  A ROS was performed and pertinent positives and negatives are included.  Exam:  Vitals:   11/08/18 1001  BP: 122/78  Weight: 240 lb (108.9 kg)  Height: 5\' 5"  (1.651 m)   Body mass index is 39.94 kg/m.   General appearance:  Normal Thyroid:  Symmetrical, normal in size, without palpable masses or nodularity. Respiratory  Auscultation:  Clear without wheezing or rhonchi Cardiovascular  Auscultation:  Regular rate, without rubs, murmurs or gallops  Edema/varicosities:  Not grossly evident Abdominal  Soft,nontender, without masses, guarding or rebound.  Liver/spleen:  No organomegaly noted  Hernia:  None appreciated  Skin  Inspection:  Grossly normal red irritated fungal appearing rash no open areas at pannus   Breasts: Examined lying and sitting.     Right: Without masses, retractions, discharge or axillary adenopathy.     Left: Without masses, retractions, discharge or axillary adenopathy. Gentitourinary   Inguinal/mons:  Normal without inguinal adenopathy  External genitalia:  Normal  BUS/Urethra/Skene's glands:  Normal  Vagina:  Normal  Cervix:   Normal  Uterus:  Normal in size, shape and contour.  Midline and mobile  Adnexa/parametria:     Rt: Without masses or tenderness.   Lt: Without masses or tenderness.  Anus and perineum: Normal  Digital rectal exam: Normal sphincter tone without palpated masses or tenderness  Assessment/Plan:  48 y.o. BSF, G1 P0 for annual exam.  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   Plan: Contraception reviewed, condoms encouraged if sexually active.  Instructed to to call if cycles last greater than 7 days.  Menopause reviewed.  SBE's, continue annual screening mammogram, calcium rich foods, vitamin D 1000 daily encouraged.  Reviewed importance of increasing fruits/vegetables,  decreasing carbs, increasing regular cardio type exercise.  Pap normal 2019, , G/C, & RPR/HIV . New pap screening guidelines reviewed.      Huel Cote [facility], 10:17 AM 11/08/2018

## 2018-11-09 ENCOUNTER — Other Ambulatory Visit: Payer: Self-pay

## 2018-11-09 LAB — RPR: RPR Ser Ql: NONREACTIVE

## 2018-11-09 LAB — C. TRACHOMATIS/N. GONORRHOEAE RNA
C. trachomatis RNA, TMA: NOT DETECTED
N. gonorrhoeae RNA, TMA: NOT DETECTED

## 2018-11-09 LAB — HIV ANTIBODY (ROUTINE TESTING W REFLEX): HIV 1&2 Ab, 4th Generation: NONREACTIVE

## 2018-11-09 MED ORDER — NYSTATIN-TRIAMCINOLONE 100000-0.1 UNIT/GM-% EX OINT: 1.0000 "application " | TOPICAL_OINTMENT | Freq: Two times a day (BID) | CUTANEOUS | 0 refills | Status: DC

## 2018-11-21 ENCOUNTER — Telehealth: Payer: Self-pay | Admitting: Oncology

## 2018-11-21 NOTE — Telephone Encounter (Signed)
[facility] transfer to [facility]. Left message re November lab/fu. November per [facility] Schedule mailed. Patient asked to call back directly to confirm appointment details.

## 2018-12-04 ENCOUNTER — Telehealth: Payer: Self-pay

## 2018-12-04 NOTE — Telephone Encounter (Signed)
TC states continues to bleed, has had less bleeding today has only changed a pad twice.  Day 11 of cycle.  Is on Xarelto history of DVT and PE.  Reviewed best not to use any hormonal manipulation due to the increased risk for blood clots.  Will watch at this time and if bleeding persists instructed to call.  Irregular cycles this past year.

## 2018-12-04 NOTE — Telephone Encounter (Signed)
Patient called because she is on Day 11 of her menses. She has problems with anemia and wondered if you could prescribe something to stop the bleeding.

## 2018-12-15 ENCOUNTER — Ambulatory Visit
Admission: RE | Admit: 2018-12-15 | Discharge: 2018-12-15 | Disposition: A | Discharge status: Home / Self Care | Payer: BC Managed Care – PPO | Source: Ambulatory Visit | Attending: Family Medicine | Admitting: Family Medicine

## 2018-12-15 ENCOUNTER — Other Ambulatory Visit: Payer: Self-pay

## 2018-12-15 DIAGNOSIS — Z1231 Encounter for screening mammogram for malignant neoplasm of breast: Secondary | ICD-10-CM

## 2018-12-16 LAB — HM MAMMOGRAPHY

## 2018-12-26 ENCOUNTER — Encounter: Payer: Self-pay | Admitting: Family Medicine

## 2018-12-27 ENCOUNTER — Inpatient Hospital Stay: Payer: BC Managed Care – PPO | Admitting: Oncology

## 2018-12-27 ENCOUNTER — Inpatient Hospital Stay: Payer: BC Managed Care – PPO

## 2019-01-12 ENCOUNTER — Inpatient Hospital Stay: Payer: BC Managed Care – PPO | Attending: Oncology

## 2019-01-12 ENCOUNTER — Inpatient Hospital Stay (HOSPITAL_BASED_OUTPATIENT_CLINIC_OR_DEPARTMENT_OTHER): Payer: BC Managed Care – PPO | Admitting: Oncology

## 2019-01-12 ENCOUNTER — Other Ambulatory Visit: Payer: Self-pay

## 2019-01-12 VITALS — BP 134/78 | HR 92 | Temp 98.2°F | Resp 18 | Ht 65.0 in | Wt 240.8 lb

## 2019-01-12 DIAGNOSIS — D500 Iron deficiency anemia secondary to blood loss (chronic): Secondary | ICD-10-CM

## 2019-01-12 DIAGNOSIS — N920 Excessive and frequent menstruation with regular cycle: Secondary | ICD-10-CM | POA: Insufficient documentation

## 2019-01-12 DIAGNOSIS — Z86711 Personal history of pulmonary embolism: Secondary | ICD-10-CM | POA: Insufficient documentation

## 2019-01-12 DIAGNOSIS — Z7901 Long term (current) use of anticoagulants: Secondary | ICD-10-CM | POA: Insufficient documentation

## 2019-01-12 DIAGNOSIS — D72819 Decreased white blood cell count, unspecified: Secondary | ICD-10-CM | POA: Insufficient documentation

## 2019-01-12 DIAGNOSIS — Z79899 Other long term (current) drug therapy: Secondary | ICD-10-CM | POA: Diagnosis not present

## 2019-01-12 LAB — CBC WITH DIFFERENTIAL (CANCER CENTER ONLY)
Abs Immature Granulocytes: 0 10*3/uL (ref 0.00–0.07)
Basophils Absolute: 0 10*3/uL (ref 0.0–0.1)
Basophils Relative: 1 %
Eosinophils Absolute: 0.1 10*3/uL (ref 0.0–0.5)
Eosinophils Relative: 3 %
HCT: 38.2 % (ref 36.0–46.0)
Hemoglobin: 11.9 g/dL — ABNORMAL LOW (ref 12.0–15.0)
Immature Granulocytes: 0 %
Lymphocytes Relative: 38 %
Lymphs Abs: 1.3 10*3/uL (ref 0.7–4.0)
MCH: 28.2 pg (ref 26.0–34.0)
MCHC: 31.2 g/dL (ref 30.0–36.0)
MCV: 90.5 fL (ref 80.0–100.0)
Monocytes Absolute: 0.3 10*3/uL (ref 0.1–1.0)
Monocytes Relative: 8 %
Neutro Abs: 1.8 10*3/uL (ref 1.7–7.7)
Neutrophils Relative %: 50 %
Platelet Count: 221 10*3/uL (ref 150–400)
RBC: 4.22 MIL/uL (ref 3.87–5.11)
RDW: 12.5 % (ref 11.5–15.5)
WBC Count: 3.5 10*3/uL — ABNORMAL LOW (ref 4.0–10.5)
nRBC: 0 % (ref 0.0–0.2)

## 2019-01-12 LAB — CMP (CANCER CENTER ONLY)
ALT: 20 U/L (ref 0–44)
AST: 15 U/L (ref 15–41)
Albumin: 3.9 g/dL (ref 3.5–5.0)
Alkaline Phosphatase: 99 U/L (ref 38–126)
Anion gap: 10 (ref 5–15)
BUN: 12 mg/dL (ref 6–20)
CO2: 24 mmol/L (ref 22–32)
Calcium: 9.3 mg/dL (ref 8.9–10.3)
Chloride: 106 mmol/L (ref 98–111)
Creatinine: 0.95 mg/dL (ref 0.44–1.00)
GFR, Est AFR Am: >60 mL/min (ref >60)
GFR, Estimated: >60 mL/min (ref >60)
Glucose, Bld: 154 mg/dL — ABNORMAL HIGH (ref 70–99)
Potassium: 4 mmol/L (ref 3.5–5.1)
Sodium: 140 mmol/L (ref 135–145)
Total Bilirubin: 0.3 mg/dL (ref 0.3–1.2)
Total Protein: 7.1 g/dL (ref 6.5–8.1)

## 2019-01-12 LAB — FERRITIN: Ferritin: 142 ng/mL (ref 11–307)

## 2019-01-12 NOTE — Progress Notes (Signed)
Hematology and Oncology Follow Up Visit  Marissa [name] [id number] 07-Apr-1970 48 y.o. 01/12/2019 9:31 AM Marissa [name], MDBanks, Marissa Adie, MD   Principle Diagnosis: 48 year old woman with:  1.  Iron deficiency anemia related to chronic menstrual blood losses anticoagulation diagnosed in December 2019.  2.  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  She is chronically anticoagulated on Xarelto for life.   Prior Therapy: Status post IV iron infusion in January 2020 and repeated in June 2020.  Current therapy: Active surveillance.  Interim History: Marissa [name] returns today for a follow-up visit.  Since the last visit with Marissa [name] she reports feeling reasonably well without any complaints at this time.  She denies any nausea, vomiting or abdominal pain.  She denies any hematochezia or melena.  She continues to tolerate Xarelto without any issues or concerns.  Her performance status and quality of life remains unchanged at this time.  She does report periodic menstrual cycles about once every 3 months and at times they are heavy.   She denied headaches, blurry vision, syncope or seizures.  Denies any fevers, chills or sweats.  Denied chest pain, palpitation, orthopnea or leg edema.  Denied cough, wheezing or hemoptysis.  Denied nausea, vomiting or abdominal pain.  Denies any constipation or diarrhea.  Denies any frequency urgency or hesitancy.  Denies any arthralgias or myalgias.  Denies any skin rashes or lesions.  Denies any bleeding or clotting tendency.  Denies any easy bruising.  Denies any hair or nail changes.  Denies any anxiety or depression.  Remaining review of system is negative.     Medications: I have reviewed the patient's current medications.  Current Outpatient Medications  Medication Sig Dispense Refill  . cholecalciferol (VITAMIN D3) 25 MCG (1000 UT) tablet Take 1,000 Units by mouth daily.    . Multiple Vitamin (MULTIVITAMIN WITH MINERALS) TABS tablet Take 1 tablet  by mouth daily.    [name] Kitchen nystatin-triamcinolone ointment (MYCOLOG) Apply 1 application topically 2 (two) times daily. 30 g 0  . rivaroxaban (XARELTO) 20 MG TABS tablet Take 1 tablet (20 mg total) by mouth daily with supper. 90 tablet 3   No current facility-administered medications for this visit.      Allergies:  Allergies  Allergen Reactions  . Codeine Nausea Only    Past Medical History, Surgical history, Social history, and Family History were reviewed and updated.    Physical Exam: Blood pressure 134/78, pulse 92, temperature 98.2 F (36.8 C), temperature source Temporal, resp. rate 18, height 5\' 5"  (1.651 m), weight 240 lb 12.8 oz (109.2 kg), SpO2 100 %.  ECOG: 0 General appearance: alert and cooperative appeared without distress. Head: Normocephalic, without obvious abnormality Oropharynx: No oral thrush or ulcers. Eyes: No scleral icterus.  Pupils are equal and round reactive to light. Lymph nodes: Cervical, supraclavicular, and axillary nodes normal. Heart:regular rate and rhythm, S1, S2 normal, no murmur, click, rub or gallop Lung:chest clear, no wheezing, rales, normal symmetric air entry Abdomin: soft, non-tender, without masses or organomegaly. Neurological: No motor, sensory deficits.  Intact deep tendon reflexes. Skin: No rashes or lesions.  No ecchymosis or petechiae. Musculoskeletal: No joint deformity or effusion.     Lab Results: Lab Results  Component Value Date   WBC 3.2 (L) 09/08/2018   HGB 11.8 (L) 09/08/2018   HCT 37.5 09/08/2018   MCV 89.9 09/08/2018   PLT 222 09/08/2018     Chemistry      Component Value Date/Time  NA 141 09/08/2018 0912   NA 139 02/23/2017 1004   K 3.9 09/08/2018 0912   K 3.6 02/23/2017 1004   CL 106 09/08/2018 0912   CO2 25 09/08/2018 0912   CO2 24 02/23/2017 1004   BUN 11 09/08/2018 0912   BUN 8.3 02/23/2017 1004   CREATININE 0.93 09/08/2018 0912   CREATININE 1.0 02/23/2017 1004      Component Value Date/Time    CALCIUM 8.9 09/08/2018 0912   CALCIUM 9.1 02/23/2017 1004   ALKPHOS 98 09/08/2018 0912   ALKPHOS 97 02/23/2017 1004   AST 19 09/08/2018 0912   AST 15 02/23/2017 1004   ALT 28 09/08/2018 0912   ALT 15 02/23/2017 1004   BILITOT 0.3 09/08/2018 0912   BILITOT 0.28 02/23/2017 1004      Results for Marissa [name] (MRN [id number]) as of 01/12/2019 09:13  Ref. Range 09/08/2018 09:12  Ferritin Latest Ref Range: 11 - 307 ng/mL 265     Impression and Plan:   48 year old woman with:  1.  Iron deficiency anemia related to chronic menstrual blood losses as well as chronic anticoagulation.  She status post intravenous iron on few occasions although she has not reported any specific issues with oral iron.  Her hemoglobin today is 11.9 and iron studies are currently pending.  I recommended periodic monitoring at this time and oral iron therapy for maintenance purposes pending the results of her iron studies.  Repeat intravenous iron may be needed if she develops worsening anemia in the future.  2.  Recurrent venous thromboembolism.  She had an episode in 2015 and had another one in 2018 off anticoagulation.  Since that time she has been on Xarelto without any recurrent issues.  Risks and benefits of continuing this therapy long-term was reviewed.  Potential complications including bleeding GI and GU sources as well as continue menstrual blood losses and need for recurrent iron infusion.  At this time, she is agreeable to continue given her high risk of recurrent thromboembolism.  3.  Leukocytopenia: Appears to be mild and normal variant rather than a blood disorder.  4.  Follow-up: Will be in 6 months for repeat evaluation.   25  minutes was spent with the patient face-to-face today.  More than 50% of time was spent on reviewing laboratory data, disease status update as well as discussing future treatment plan.    Marissa Button, MD 11/20/20209:31 AM

## 2019-01-15 ENCOUNTER — Telehealth: Payer: Self-pay | Admitting: Oncology

## 2019-01-15 NOTE — Telephone Encounter (Signed)
Scheduled appt per 11/20 los. ° °Left a vm of the appt date and time. °

## 2019-04-18 DIAGNOSIS — M7542 Impingement syndrome of left shoulder: Secondary | ICD-10-CM | POA: Diagnosis not present

## 2019-04-18 DIAGNOSIS — M7581 Other shoulder lesions, right shoulder: Secondary | ICD-10-CM | POA: Diagnosis not present

## 2019-04-18 DIAGNOSIS — M7582 Other shoulder lesions, left shoulder: Secondary | ICD-10-CM | POA: Diagnosis not present

## 2019-04-30 ENCOUNTER — Telehealth: Payer: Self-pay | Admitting: Family Medicine

## 2019-04-30 NOTE — Telephone Encounter (Signed)
Please advise 

## 2019-04-30 NOTE — Telephone Encounter (Signed)
Yes

## 2019-04-30 NOTE — Telephone Encounter (Signed)
Patient wants to know if it is okay for her to have the [place] vaccination since she takes rivaroxaban (XARELTO) 20 MG TABS tablet   Please advise

## 2019-05-01 NOTE — Telephone Encounter (Signed)
Left detailed message for pt advising that per Dr Volanda Napoleon its ok to get the COVID-19 vaccine

## 2019-05-26 ENCOUNTER — Ambulatory Visit: Payer: BC Managed Care – PPO | Attending: Internal Medicine

## 2019-05-26 DIAGNOSIS — Z23 Encounter for immunization: Secondary | ICD-10-CM

## 2019-05-26 NOTE — Progress Notes (Signed)
   Covid-19 Vaccination Clinic  Name:  Marissa [name]    MRN: [id number] DOB: December 10, 1970  05/26/2019  [name] was observed post Covid-19 immunization for 15 minutes without incident. She was provided with Vaccine Information Sheet and instruction to access the V-Safe system.   [name] was instructed to call 911 with any severe reactions post vaccine: [name] Kitchen Difficulty breathing  . Swelling of face and throat  . A fast heartbeat  . A bad rash all over body  . Dizziness and weakness   Immunizations Administered    Name Date Dose VIS Date Route   Pfizer COVID-19 Vaccine 05/26/2019  9:29 AM 0.3 mL 02/02/2019 Intramuscular   Manufacturer: [place]   Lot: [id number]   [place]: [id number]

## 2019-06-20 ENCOUNTER — Ambulatory Visit: Payer: BC Managed Care – PPO | Attending: Internal Medicine

## 2019-06-20 DIAGNOSIS — Z23 Encounter for immunization: Secondary | ICD-10-CM

## 2019-06-20 NOTE — Progress Notes (Signed)
   Covid-19 Vaccination Clinic  Name:  Marissa [name]    MRN: [id number] DOB: 1970-03-10  06/20/2019  [name] was observed post Covid-19 immunization for 15 minutes without incident. She was provided with Vaccine Information Sheet and instruction to access the V-Safe system.   [name] was instructed to call 911 with any severe reactions post vaccine: [name] Kitchen Difficulty breathing  . Swelling of face and throat  . A fast heartbeat  . A bad rash all over body  . Dizziness and weakness   Immunizations Administered    Name Date Dose VIS Date Route   Pfizer COVID-19 Vaccine 06/20/2019  9:29 AM 0.3 mL 04/18/2018 Intramuscular   Manufacturer: [place]   Lot: [id number]   [place]: [id number]

## 2019-07-13 ENCOUNTER — Inpatient Hospital Stay (HOSPITAL_BASED_OUTPATIENT_CLINIC_OR_DEPARTMENT_OTHER): Payer: BC Managed Care – PPO | Admitting: Oncology

## 2019-07-13 ENCOUNTER — Inpatient Hospital Stay: Payer: BC Managed Care – PPO | Attending: Oncology

## 2019-07-13 ENCOUNTER — Other Ambulatory Visit: Payer: Self-pay

## 2019-07-13 VITALS — BP 124/80 | HR 87 | Temp 97.7°F | Resp 18 | Ht 65.0 in | Wt 236.2 lb

## 2019-07-13 DIAGNOSIS — D500 Iron deficiency anemia secondary to blood loss (chronic): Secondary | ICD-10-CM | POA: Diagnosis not present

## 2019-07-13 DIAGNOSIS — Z7901 Long term (current) use of anticoagulants: Secondary | ICD-10-CM | POA: Diagnosis not present

## 2019-07-13 DIAGNOSIS — I2609 Other pulmonary embolism with acute cor pulmonale: Secondary | ICD-10-CM

## 2019-07-13 DIAGNOSIS — Z79899 Other long term (current) drug therapy: Secondary | ICD-10-CM | POA: Insufficient documentation

## 2019-07-13 DIAGNOSIS — Z86718 Personal history of other venous thrombosis and embolism: Secondary | ICD-10-CM | POA: Insufficient documentation

## 2019-07-13 LAB — CBC WITH DIFFERENTIAL (CANCER CENTER ONLY)
Abs Immature Granulocytes: 0.01 10*3/uL (ref 0.00–0.07)
Basophils Absolute: 0 10*3/uL (ref 0.0–0.1)
Basophils Relative: 1 %
Eosinophils Absolute: 0.1 10*3/uL (ref 0.0–0.5)
Eosinophils Relative: 2 %
HCT: 40 % (ref 36.0–46.0)
Hemoglobin: 12.5 g/dL (ref 12.0–15.0)
Immature Granulocytes: 0 %
Lymphocytes Relative: 45 %
Lymphs Abs: 1.7 10*3/uL (ref 0.7–4.0)
MCH: 28 pg (ref 26.0–34.0)
MCHC: 31.3 g/dL (ref 30.0–36.0)
MCV: 89.5 fL (ref 80.0–100.0)
Monocytes Absolute: 0.3 10*3/uL (ref 0.1–1.0)
Monocytes Relative: 9 %
Neutro Abs: 1.6 10*3/uL — ABNORMAL LOW (ref 1.7–7.7)
Neutrophils Relative %: 43 %
Platelet Count: 232 10*3/uL (ref 150–400)
RBC: 4.47 MIL/uL (ref 3.87–5.11)
RDW: 12.2 % (ref 11.5–15.5)
WBC Count: 3.7 10*3/uL — ABNORMAL LOW (ref 4.0–10.5)
nRBC: 0 % (ref 0.0–0.2)

## 2019-07-13 LAB — IRON AND TIBC
Iron: 77 ug/dL (ref 41–142)
Saturation Ratios: 26 % (ref 21–57)
TIBC: 299 ug/dL (ref 236–444)
UIBC: 222 ug/dL (ref 120–384)

## 2019-07-13 LAB — FERRITIN: Ferritin: 165 ng/mL (ref 11–307)

## 2019-07-13 MED ORDER — RIVAROXABAN 20 MG PO TABS: 20.0000 mg | ORAL_TABLET | Freq: Every day | ORAL | 0 refills | Status: DC

## 2019-07-13 NOTE — Progress Notes (Signed)
Hematology and Oncology Follow Up Visit  Marissa [name] [id number] [birth year] 49 y.o. 07/13/2019 1:02 PM Marissa [name], MDBanks, Marissa Adie, MD   Principle Diagnosis: 49 year old woman with:  1.  Anemia related to chronic of blood losses diagnosed in December 2019.  He was found to have iron deficiency exacerbated by chronic anticoagulation.  2.  Recurrent venous thromboembolism  diagnosed in 2015 with relapse in 2018.  .   Prior Therapy: Status post IV iron infusion in January 2020 and repeated in June 2020.  Current therapy: Xarelto 20 mg daily to be given indefinitely.  Interim History: [name] is here for return evaluation.  Since the last visit, she reports no major changes in her health.  She continues to tolerate Xarelto without any recent complaints.  She denies any recent hospitalizations or illnesses.  She denies any hematochezia or melena.  She is reported decrease in her menstrual cycles and currently has a cycle once every 4 months.     Medications: Reviewed without changes Current Outpatient Medications  Medication Sig Dispense Refill  . cholecalciferol (VITAMIN D3) 25 MCG (1000 UT) tablet Take 1,000 Units by mouth daily.    . Multiple Vitamin (MULTIVITAMIN WITH MINERALS) TABS tablet Take 1 tablet by mouth daily.    [name] Kitchen nystatin-triamcinolone ointment (MYCOLOG) Apply 1 application topically 2 (two) times daily. 30 g 0  . rivaroxaban (XARELTO) 20 MG TABS tablet Take 1 tablet (20 mg total) by mouth daily with supper. 90 tablet 3   No current facility-administered medications for this visit.     Allergies:  Allergies  Allergen Reactions  . Codeine Nausea Only        Physical Exam: Blood pressure 124/80, pulse 87, temperature 97.7 F (36.5 C), temperature source Temporal, resp. rate 18, height 5\' 5"  (1.651 m), weight 236 lb 3.2 oz (107.1 kg), SpO2 97 %.   ECOG: 0   General appearance: Alert, awake without any distress. Head: Atraumatic without  abnormalities Oropharynx: Without any thrush or ulcers. Eyes: No scleral icterus. Lymph nodes: No lymphadenopathy noted in the cervical, supraclavicular, or axillary nodes Heart:regular rate and rhythm, without any murmurs or gallops.   Lung: Clear to auscultation without any rhonchi, wheezes or dullness to percussion. Abdomin: Soft, nontender without any shifting dullness or ascites. Musculoskeletal: No clubbing or cyanosis. Neurological: No motor or sensory deficits. Skin: No rashes or lesions.      Lab Results: Lab Results  Component Value Date   WBC 3.5 (L) 01/12/2019   HGB 11.9 (L) 01/12/2019   HCT 38.2 01/12/2019   MCV 90.5 01/12/2019   PLT 221 01/12/2019     Chemistry      Component Value Date/Time   NA 140 01/12/2019 0916   NA 139 02/23/2017 1004   K 4.0 01/12/2019 0916   K 3.6 02/23/2017 1004   CL 106 01/12/2019 0916   CO2 24 01/12/2019 0916   CO2 24 02/23/2017 1004   BUN 12 01/12/2019 0916   BUN 8.3 02/23/2017 1004   CREATININE 0.95 01/12/2019 0916   CREATININE 1.0 02/23/2017 1004      Component Value Date/Time   CALCIUM 9.3 01/12/2019 0916   CALCIUM 9.1 02/23/2017 1004   ALKPHOS 99 01/12/2019 0916   ALKPHOS 97 02/23/2017 1004   AST 15 01/12/2019 0916   AST 15 02/23/2017 1004   ALT 20 01/12/2019 0916   ALT 15 02/23/2017 1004   BILITOT 0.3 01/12/2019 0916   BILITOT 0.28 02/23/2017 1004  Impression and Plan:   49 year old woman with:  1.  Anemia related to chronic menstrual blood losses and anticoagulation diagnosed in 2019 with iron deficiency.   She has received intravenous iron in the past and has tolerated oral iron as well.  Risks and benefits of continuing this approach was reviewed and the role of repeat intravenous iron was discussed.  Her hemoglobin today is normal and does not require any additional intervention.  Iron studies are currently pending however.  At this time, I have recommended no iron replacement unless iron  deficiency develops again.  2.  Recurrent venous thromboembolism.  She is at risk of developing another thrombosis given the history of unprovoked thromboembolism on 2 separate occasions.  She is currently anticoagulated on Xarelto which needs to be continued indefinitely.  Potential complications with this treatment including worsening blood losses and worsening anemia in the future.  I have recommended continuing Xarelto indefinitely which she is agreeable.  3.  Leukocytopenia: Due to benign normal variation with absolute neutrophil count close to normal range.  No intervention is needed at this time.  4.  Follow-up: In 1 year for repeat evaluation.   30  minutes were dedicated to this visit. The time was spent on reviewing laboratory data, discussing treatment options, and answering questions regarding future plan.    Zola Button, MD 5/21/20211:02 PM

## 2019-07-17 ENCOUNTER — Telehealth: Payer: Self-pay | Admitting: Oncology

## 2019-07-17 NOTE — Telephone Encounter (Signed)
Scheduled per 05/20

## 2019-07-17 NOTE — Telephone Encounter (Signed)
Scheduled per 05/21 los, patient has been called and voicemail was left. 

## 2019-07-26 DIAGNOSIS — M7542 Impingement syndrome of left shoulder: Secondary | ICD-10-CM | POA: Diagnosis not present

## 2019-08-07 DIAGNOSIS — M25512 Pain in left shoulder: Secondary | ICD-10-CM | POA: Diagnosis not present

## 2019-08-10 DIAGNOSIS — M25512 Pain in left shoulder: Secondary | ICD-10-CM | POA: Diagnosis not present

## 2019-08-15 ENCOUNTER — Other Ambulatory Visit: Payer: Self-pay

## 2019-08-15 ENCOUNTER — Encounter (HOSPITAL_BASED_OUTPATIENT_CLINIC_OR_DEPARTMENT_OTHER): Payer: Self-pay | Admitting: Orthopaedic Surgery

## 2019-08-15 NOTE — Progress Notes (Addendum)
Spoke w/ via phone for pre-op interview---pt Lab needs dos----   Urine poct          COVID test ------08-18-2019@1220  pm Arrive at -------830 am 08-22-2019 No food after midnight clear liquids from midnight until 730 am no milk, creamer or dairy products then npo Medications to take morning of surgery -----none Diabetic medication -----n/a Patient Special Instructions -----none Pre-Op special Istructions -----none Patient verbalized understanding of instructions that were given at this phone interview. Patient denies shortness of breath, chest pain, fever, cough at pre op phone call.  Patient instructed to stop xarelto 2 days before surgery per dr Alen Blew, hematology  clearance for surgery and stopping xarelto 2 days before surgery note on patient chart. lov dr Alen Blew 07-13-2019 epic.

## 2019-08-18 ENCOUNTER — Other Ambulatory Visit (HOSPITAL_COMMUNITY)
Admission: RE | Admit: 2019-08-18 | Discharge: 2019-08-18 | Disposition: A | Discharge status: Home / Self Care | Payer: BC Managed Care – PPO | Source: Ambulatory Visit | Attending: Orthopaedic Surgery | Admitting: Orthopaedic Surgery

## 2019-08-18 DIAGNOSIS — Z01812 Encounter for preprocedural laboratory examination: Secondary | ICD-10-CM | POA: Insufficient documentation

## 2019-08-18 DIAGNOSIS — Z20822 Contact with and (suspected) exposure to covid-19: Secondary | ICD-10-CM | POA: Insufficient documentation

## 2019-08-18 LAB — SARS CORONAVIRUS 2 (TAT 6-24 HRS): SARS Coronavirus 2: NEGATIVE
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
## 2019-08-22 ENCOUNTER — Other Ambulatory Visit: Payer: Self-pay

## 2019-08-22 ENCOUNTER — Encounter (HOSPITAL_BASED_OUTPATIENT_CLINIC_OR_DEPARTMENT_OTHER): Payer: Self-pay | Admitting: Orthopaedic Surgery

## 2019-08-22 ENCOUNTER — Ambulatory Visit (HOSPITAL_BASED_OUTPATIENT_CLINIC_OR_DEPARTMENT_OTHER): Payer: BC Managed Care – PPO | Admitting: Anesthesiology

## 2019-08-22 ENCOUNTER — Encounter (HOSPITAL_BASED_OUTPATIENT_CLINIC_OR_DEPARTMENT_OTHER)
Admission: RE | Disposition: A | Discharge status: Home / Self Care | Payer: Self-pay | Source: Home / Self Care | Attending: Orthopaedic Surgery

## 2019-08-22 ENCOUNTER — Ambulatory Visit (HOSPITAL_BASED_OUTPATIENT_CLINIC_OR_DEPARTMENT_OTHER)
Admission: RE | Admit: 2019-08-22 | Discharge: 2019-08-22 | Disposition: A | Discharge status: Home / Self Care | Payer: BC Managed Care – PPO | Attending: Orthopaedic Surgery | Admitting: Orthopaedic Surgery

## 2019-08-22 DIAGNOSIS — Z86718 Personal history of other venous thrombosis and embolism: Secondary | ICD-10-CM | POA: Insufficient documentation

## 2019-08-22 DIAGNOSIS — Z6839 Body mass index (BMI) 39.0-39.9, adult: Secondary | ICD-10-CM | POA: Insufficient documentation

## 2019-08-22 DIAGNOSIS — D251 Intramural leiomyoma of uterus: Secondary | ICD-10-CM | POA: Diagnosis not present

## 2019-08-22 DIAGNOSIS — M7542 Impingement syndrome of left shoulder: Secondary | ICD-10-CM | POA: Diagnosis not present

## 2019-08-22 DIAGNOSIS — Z7901 Long term (current) use of anticoagulants: Secondary | ICD-10-CM | POA: Insufficient documentation

## 2019-08-22 DIAGNOSIS — E669 Obesity, unspecified: Secondary | ICD-10-CM | POA: Insufficient documentation

## 2019-08-22 DIAGNOSIS — M7502 Adhesive capsulitis of left shoulder: Secondary | ICD-10-CM | POA: Insufficient documentation

## 2019-08-22 DIAGNOSIS — Z86711 Personal history of pulmonary embolism: Secondary | ICD-10-CM | POA: Insufficient documentation

## 2019-08-22 DIAGNOSIS — D500 Iron deficiency anemia secondary to blood loss (chronic): Secondary | ICD-10-CM | POA: Diagnosis not present

## 2019-08-22 DIAGNOSIS — M25612 Stiffness of left shoulder, not elsewhere classified: Secondary | ICD-10-CM | POA: Diagnosis not present

## 2019-08-22 DIAGNOSIS — G8918 Other acute postprocedural pain: Secondary | ICD-10-CM | POA: Diagnosis not present

## 2019-08-22 DIAGNOSIS — M24112 Other articular cartilage disorders, left shoulder: Secondary | ICD-10-CM | POA: Diagnosis not present

## 2019-08-22 HISTORY — PX: LYSIS OF ADHESION: SHX5961

## 2019-08-22 HISTORY — DX: Personal history of urinary calculi: Z87.442

## 2019-08-22 HISTORY — PX: SHOULDER ARTHROSCOPY WITH DEBRIDEMENT AND BICEP TENDON REPAIR: SHX5690

## 2019-08-22 HISTORY — DX: Other complications of anesthesia, initial encounter: T88.59XA

## 2019-08-22 HISTORY — DX: Adhesive capsulitis of left shoulder: M75.02

## 2019-08-22 LAB — POCT PREGNANCY, URINE: Preg Test, Ur: NEGATIVE

## 2019-08-22 SURGERY — SHOULDER ARTHROSCOPY WITH DEBRIDEMENT AND BICEP TENDON REPAIR
Anesthesia: General | Site: Shoulder | Laterality: Left

## 2019-08-22 MED ORDER — BUPIVACAINE LIPOSOME 1.3 % IJ SUSP
INTRAMUSCULAR | Status: DC | PRN
Start: 2019-08-22 — End: 2019-08-22
  Administered 2019-08-22: 10 mL via PERINEURAL

## 2019-08-22 MED ORDER — EPHEDRINE 5 MG/ML INJ
INTRAVENOUS | Status: AC
  Filled 2019-08-22: qty 10

## 2019-08-22 MED ORDER — OXYCODONE HCL 5 MG PO TABS
ORAL_TABLET | ORAL | Status: AC
  Filled 2019-08-22: qty 1

## 2019-08-22 MED ORDER — FENTANYL CITRATE (PF) 100 MCG/2ML IJ SOLN
100.0000 ug | Freq: Once | INTRAMUSCULAR | Status: AC
  Administered 2019-08-22: 50 ug via INTRAVENOUS

## 2019-08-22 MED ORDER — FENTANYL CITRATE (PF) 100 MCG/2ML IJ SOLN: 25.0000 ug | INTRAMUSCULAR | Status: DC | PRN

## 2019-08-22 MED ORDER — PHENYLEPHRINE 40 MCG/ML (10ML) SYRINGE FOR IV PUSH (FOR BLOOD PRESSURE SUPPORT)
PREFILLED_SYRINGE | INTRAVENOUS | Status: AC
  Filled 2019-08-22: qty 10

## 2019-08-22 MED ORDER — FENTANYL CITRATE (PF) 100 MCG/2ML IJ SOLN
INTRAMUSCULAR | Status: AC
  Filled 2019-08-22: qty 2

## 2019-08-22 MED ORDER — SUGAMMADEX SODIUM 200 MG/2ML IV SOLN
INTRAVENOUS | Status: DC | PRN
  Administered 2019-08-22: 300 mg via INTRAVENOUS

## 2019-08-22 MED ORDER — PHENYLEPHRINE 40 MCG/ML (10ML) SYRINGE FOR IV PUSH (FOR BLOOD PRESSURE SUPPORT)
PREFILLED_SYRINGE | INTRAVENOUS | Status: DC | PRN
  Administered 2019-08-22 (×5): 80 ug via INTRAVENOUS

## 2019-08-22 MED ORDER — MEPERIDINE HCL 25 MG/ML IJ SOLN: 6.2500 mg | INTRAMUSCULAR | Status: DC | PRN

## 2019-08-22 MED ORDER — MIDAZOLAM HCL 2 MG/2ML IJ SOLN
2.0000 mg | Freq: Once | INTRAMUSCULAR | Status: AC
  Administered 2019-08-22: 2 mg via INTRAVENOUS

## 2019-08-22 MED ORDER — CEFAZOLIN SODIUM-DEXTROSE 2-4 GM/100ML-% IV SOLN
2.0000 g | INTRAVENOUS | Status: AC
  Administered 2019-08-22: 2 g via INTRAVENOUS

## 2019-08-22 MED ORDER — CEFAZOLIN SODIUM-DEXTROSE 2-4 GM/100ML-% IV SOLN
INTRAVENOUS | Status: AC
  Filled 2019-08-22: qty 100

## 2019-08-22 MED ORDER — ONDANSETRON HCL 4 MG/2ML IJ SOLN: 4.0000 mg | Freq: Once | INTRAMUSCULAR | Status: DC | PRN

## 2019-08-22 MED ORDER — BUPIVACAINE HCL (PF) 0.25 % IJ SOLN
INTRAMUSCULAR | Status: AC
  Filled 2019-08-22: qty 30

## 2019-08-22 MED ORDER — EPINEPHRINE PF 1 MG/ML IJ SOLN
INTRAMUSCULAR | Status: AC
  Filled 2019-08-22: qty 1

## 2019-08-22 MED ORDER — ONDANSETRON HCL 4 MG/2ML IJ SOLN
INTRAMUSCULAR | Status: AC
  Filled 2019-08-22: qty 2

## 2019-08-22 MED ORDER — LIDOCAINE 2% (20 MG/ML) 5 ML SYRINGE
INTRAMUSCULAR | Status: AC
  Filled 2019-08-22: qty 5

## 2019-08-22 MED ORDER — MIDAZOLAM HCL 2 MG/2ML IJ SOLN
INTRAMUSCULAR | Status: AC
  Filled 2019-08-22: qty 2

## 2019-08-22 MED ORDER — SODIUM CHLORIDE 0.9 % IR SOLN
Status: DC | PRN
  Administered 2019-08-22: 6000 mL

## 2019-08-22 MED ORDER — ROCURONIUM BROMIDE 10 MG/ML (PF) SYRINGE
PREFILLED_SYRINGE | INTRAVENOUS | Status: DC | PRN
  Administered 2019-08-22: 60 mg via INTRAVENOUS

## 2019-08-22 MED ORDER — FENTANYL CITRATE (PF) 100 MCG/2ML IJ SOLN
INTRAMUSCULAR | Status: DC | PRN
  Administered 2019-08-22: 100 ug via INTRAVENOUS

## 2019-08-22 MED ORDER — PROPOFOL 10 MG/ML IV BOLUS
INTRAVENOUS | Status: DC | PRN
  Administered 2019-08-22: 160 mg via INTRAVENOUS

## 2019-08-22 MED ORDER — ONDANSETRON HCL 4 MG PO TABS: 4.0000 mg | ORAL_TABLET | Freq: Three times a day (TID) | ORAL | 1 refills | Status: AC | PRN

## 2019-08-22 MED ORDER — MELOXICAM 7.5 MG PO TABS
7.5000 mg | ORAL_TABLET | Freq: Every day | ORAL | 0 refills | Status: AC
Start: 2019-08-22 — End: 2019-09-05

## 2019-08-22 MED ORDER — EPHEDRINE SULFATE-NACL 50-0.9 MG/10ML-% IV SOSY
PREFILLED_SYRINGE | INTRAVENOUS | Status: DC | PRN
  Administered 2019-08-22: 10 mg via INTRAVENOUS

## 2019-08-22 MED ORDER — ROCURONIUM BROMIDE 10 MG/ML (PF) SYRINGE
PREFILLED_SYRINGE | INTRAVENOUS | Status: AC
  Filled 2019-08-22: qty 10

## 2019-08-22 MED ORDER — BUPIVACAINE-EPINEPHRINE (PF) 0.5% -1:200000 IJ SOLN
INTRAMUSCULAR | Status: DC | PRN
  Administered 2019-08-22: 20 mL via PERINEURAL

## 2019-08-22 MED ORDER — ACETAMINOPHEN 500 MG PO TABS
1000.0000 mg | ORAL_TABLET | Freq: Three times a day (TID) | ORAL | 0 refills | Status: AC
Start: 2019-08-22 — End: 2019-09-05

## 2019-08-22 MED ORDER — LACTATED RINGERS IV SOLN: INTRAVENOUS | Status: DC

## 2019-08-22 MED ORDER — ONDANSETRON HCL 4 MG/2ML IJ SOLN
INTRAMUSCULAR | Status: DC | PRN
  Administered 2019-08-22: 4 mg via INTRAVENOUS

## 2019-08-22 MED ORDER — DEXAMETHASONE SODIUM PHOSPHATE 10 MG/ML IJ SOLN
INTRAMUSCULAR | Status: AC
  Filled 2019-08-22: qty 1

## 2019-08-22 MED ORDER — LIDOCAINE 2% (20 MG/ML) 5 ML SYRINGE
INTRAMUSCULAR | Status: DC | PRN
  Administered 2019-08-22: 80 mg via INTRAVENOUS

## 2019-08-22 MED ORDER — OXYCODONE HCL 5 MG PO TABS: ORAL_TABLET | ORAL | 0 refills | Status: AC

## 2019-08-22 MED ORDER — DEXAMETHASONE SODIUM PHOSPHATE 10 MG/ML IJ SOLN
INTRAMUSCULAR | Status: DC | PRN
  Administered 2019-08-22: 10 mg via INTRAVENOUS

## 2019-08-22 MED ORDER — OXYCODONE HCL 5 MG PO TABS
5.0000 mg | ORAL_TABLET | Freq: Once | ORAL | Status: AC
  Administered 2019-08-22: 5 mg via ORAL

## 2019-08-22 SURGICAL SUPPLY — 42 items
BLADE EXCALIBUR 4.0X13 (MISCELLANEOUS) ×2 IMPLANT
CHLORAPREP W/TINT 26 (MISCELLANEOUS) ×2 IMPLANT
COVER WAND RF STERILE (DRAPES) ×2 IMPLANT
DRAPE IMP U-DRAPE 54X76 (DRAPES) ×4 IMPLANT
DRAPE INCISE IOBAN 66X45 STRL (DRAPES) ×2 IMPLANT
DRAPE SHEET LG 3/4 BI-LAMINATE (DRAPES) ×2 IMPLANT
DRAPE SHOULDER BEACH CHAIR (DRAPES) ×2 IMPLANT
DRSG PAD ABDOMINAL 8X10 ST (GAUZE/BANDAGES/DRESSINGS) ×2 IMPLANT
DW OUTFLOW CASSETTE/TUBE SET (MISCELLANEOUS) ×2 IMPLANT
ELECT REM PT RETURN 9FT ADLT (ELECTROSURGICAL) ×2
ELECTRODE REM PT RTRN 9FT ADLT (ELECTROSURGICAL) ×1 IMPLANT
GAUZE SPONGE 4X4 12PLY STRL (GAUZE/BANDAGES/DRESSINGS) ×2 IMPLANT
GLOVE BIO SURGEON STRL SZ 6.5 (GLOVE) ×6 IMPLANT
GLOVE BIOGEL PI IND STRL 6.5 (GLOVE) ×1 IMPLANT
GLOVE BIOGEL PI IND STRL 7.0 (GLOVE) ×3 IMPLANT
GLOVE BIOGEL PI IND STRL 8 (GLOVE) ×2 IMPLANT
GLOVE BIOGEL PI INDICATOR 6.5 (GLOVE) ×1
GLOVE BIOGEL PI INDICATOR 7.0 (GLOVE) ×3
GLOVE BIOGEL PI INDICATOR 8 (GLOVE) ×2
GLOVE ECLIPSE 8.0 STRL XLNG CF (GLOVE) ×4 IMPLANT
GOWN STRL REUS W/ TWL LRG LVL3 (GOWN DISPOSABLE) ×2 IMPLANT
GOWN STRL REUS W/TWL LRG LVL3 (GOWN DISPOSABLE) ×4
GOWN STRL REUS W/TWL XL LVL3 (GOWN DISPOSABLE) ×2 IMPLANT
KIT STABILIZATION SHOULDER (MISCELLANEOUS) ×2 IMPLANT
MANIFOLD NEPTUNE II (INSTRUMENTS) ×2 IMPLANT
NDL SAFETY ECLIPSE 18X1.5 (NEEDLE) ×1 IMPLANT
NEEDLE HYPO 18GX1.5 SHARP (NEEDLE) ×2
PACK DSU ARTHROSCOPY (CUSTOM PROCEDURE TRAY) ×2 IMPLANT
PORT APPOLLO RF 90DEGREE MULTI (SURGICAL WAND) ×2 IMPLANT
RESTRAINT HEAD UNIVERSAL NS (MISCELLANEOUS) ×2 IMPLANT
SET BASIN DAY SURGERY F.S. (CUSTOM PROCEDURE TRAY) ×2 IMPLANT
SLEEVE SCD COMPRESS KNEE MED (MISCELLANEOUS) ×2 IMPLANT
SLING ARM FOAM STRAP LRG (SOFTGOODS) ×2 IMPLANT
STRIP CLOSURE SKIN 1/2X4 (GAUZE/BANDAGES/DRESSINGS) ×2 IMPLANT
SUT MNCRL AB 4-0 PS2 18 (SUTURE) ×2 IMPLANT
SUT PDS AB 1 CT  36 (SUTURE) ×2
SUT PDS AB 1 CT 36 (SUTURE) ×1 IMPLANT
SYR 5ML LL (SYRINGE) ×2 IMPLANT
TAPE FIBER 2MM 7IN #2 BLUE (SUTURE) IMPLANT
TOWEL OR 17X26 10 PK STRL BLUE (TOWEL DISPOSABLE) ×2 IMPLANT
TUBE CONNECTING 12X1/4 (SUCTIONS) ×2 IMPLANT
TUBING ARTHROSCOPY IRRIG 16FT (MISCELLANEOUS) ×2 IMPLANT

## 2019-08-22 NOTE — Anesthesia Preprocedure Evaluation (Addendum)
Anesthesia Evaluation  Patient identified by MRN, date of birth, ID band Patient awake  General Assessment Comment:Hx/o needing supplemental O2 after surgery 2019  Reviewed: Allergy & Precautions, NPO status , Patient's Chart, lab work & pertinent test results  History of Anesthesia Complications (+) history of anesthetic complications  Airway Mallampati: III  TM Distance: >3 FB Neck ROM: Limited    Dental no notable dental hx. (+) Teeth Intact   Pulmonary PE   Pulmonary exam normal breath sounds clear to auscultation       Cardiovascular + DVT  negative cardio ROS Normal cardiovascular exam Rhythm:Regular Rate:Normal     Neuro/Psych negative neurological ROS  negative psych ROS   GI/Hepatic negative GI ROS, Neg liver ROS,   Endo/Other  Obesity  Renal/GU Renal diseaseHx/o AKI  negative genitourinary   Musculoskeletal  (+) Arthritis , Osteoarthritis,  Adhesive capsulitis left shoulder   Abdominal (+) + obese,   Peds  Hematology  (+) anemia , Xarelto therapy- last dose 6/27    Anesthesia Other Findings   Reproductive/Obstetrics                            Anesthesia Physical Anesthesia Plan  ASA: II  Anesthesia Plan: General   Post-op Pain Management:  Regional for Post-op pain   Induction: Intravenous  PONV Risk Score and Plan: 4 or greater and Ondansetron, Dexamethasone, Midazolam, Scopolamine patch - Pre-op and Treatment may vary due to age or medical condition  Airway Management Planned: Oral ETT  Additional Equipment:   Intra-op Plan:   Post-operative Plan: Extubation in OR  Informed Consent: I have reviewed the patients History and Physical, chart, labs and discussed the procedure including the risks, benefits and alternatives for the proposed anesthesia with the patient or authorized representative who has indicated his/her understanding and acceptance.     Dental  advisory given  Plan Discussed with: CRNA, Anesthesiologist and Surgeon  Anesthesia Plan Comments:        Anesthesia Quick Evaluation

## 2019-08-22 NOTE — Progress Notes (Signed)
Assisted Dr. Foster with left, ultrasound guided, interscalene  block. Side rails up, monitors on throughout procedure. See vital signs in flow sheet. Tolerated Procedure well.  

## 2019-08-22 NOTE — Interval H&P Note (Signed)
All questions answered

## 2019-08-22 NOTE — Transfer of Care (Signed)
Immediate Anesthesia Transfer of Care Note  Patient: Marissa [name]  Procedure(s) Performed: SHOULDER ARTHROSCOPY WITH DEBRIDEMENT EXTENSIVE (Left Shoulder) LYSIS OF ADHESION AND MANIPULATION (Left Shoulder)  Patient Location: PACU  Anesthesia Type:GA combined with regional for post-op pain  Level of Consciousness: awake, alert  and oriented  Airway & Oxygen Therapy: Patient Spontanous Breathing and Patient connected to face mask oxygen  Post-op Assessment: Report given to RN and Post -op Vital signs reviewed and stable  Post vital signs: Reviewed and stable  Last Vitals:  Vitals Value Taken Time  BP 135/92 08/22/19 1206  Temp    Pulse 86 08/22/19 1207  Resp 14   SpO2 96 % 08/22/19 1207  Vitals shown include unvalidated device data.  Last Pain:  Vitals:   08/22/19 0838  TempSrc: Oral         Complications: No complications documented.

## 2019-08-22 NOTE — Discharge Instructions (Signed)
Shoulder Arthroscopy, Care After This sheet gives you information about how to care for yourself after your procedure. Your health care provider may also give you more specific instructions. If you have problems or questions, contact your health care provider. What can I expect after the procedure? After the procedure, it is common to have:  Pain that can be relieved by taking pain medicine.  Swelling.  A small amount of fluid from the incision.  Stiffness that improves over time. Follow these instructions at home: If you have a sling or immobilizer:  Wear the sling or immobilizer as told by your health care provider. Remove it only as told by your health care provider. These devices protect your shoulder and help it heal by keeping it in place.  Loosen the sling or immobilizer if your fingers tingle, become numb, or turn cold and blue.  Keep the sling or immobilizer clean.  Ask if you may remove the sling or immobilizer for bathing. If you need to keep it on while bathing and it is not waterproof: ? Do not let it get wet. ? Cover it with a watertight covering when you take a bath or a shower. Incision care   Follow instructions from your health care provider about how to take care of your incisions. Make sure you: ? Wash your hands with soap and water before you change your bandage (dressing). If soap and water are not available, use hand sanitizer. ? Change your dressing as told by your health care provider. ? Leave stitches (sutures), staples, skin glue, or adhesive strips in place. These skin closures may need to stay in place for 2 weeks or longer. If adhesive strip edges start to loosen and curl up, you may trim the loose edges. Do not remove adhesive strips completely unless your health care provider tells you to do that.  Check your incision areas every day for signs of infection. Check for: ? Redness ? More swelling or pain. ? Blood or more fluid. ? Warmth. ? Pus or a  bad smell. Bathing  Do not take baths, swim, or use a hot tub until your health care provider approves. Ask your health care provider if you may take showers. You may only be allowed to take sponge baths. Activity  Ask your health care provider what activities are safe for you during recovery, and ask what activities you need to avoid.  Do not lift with your affected shoulder until your health care provider approves.  Avoid pulling and pushing with the arm on your affected side.  If physical therapy was prescribed, do exercises as directed. Doing exercises may help to improve shoulder movement and flexibility (range of motion). Driving  Do not drive until your health care provider approves.  Do not drive or use heavy machinery while taking prescription pain medicine. Managing pain, stiffness, and swelling   If lying down flat causes shoulder discomfort, it may help to sleep in a sitting position for a few days after your procedure. Try sleeping in a reclining chair or propping yourself up with extra pillows in bed.  If directed, put ice on the affected area: ? Put ice in a plastic bag or use the icing device (cold therapy unit) that you were given. Follow instructions from your health care provider about how to use the icing device. ? Place a towel between your skin and the bag or between your skin and the icing device. ? Leave the ice on for 20 minutes, 2-3 times  a day.  Move your fingers often to avoid stiffness and to lessen swelling. General instructions  Take over-the-counter and prescription medicines only as told by your health care provider.  If you are taking prescription pain medicine, take actions to prevent or treat constipation. Your health care provider may recommend that you: ? Drink enough fluid to keep your urine pale yellow. ? Eat foods that are high in fiber, such as fresh fruits and vegetables, whole grains, and beans. ? Limit foods that are high in fat and  processed sugars, such as fried or sweet foods. ? Take an over-the-counter or prescription medicine for constipation.  Do not use any products that contain nicotine or tobacco, such as cigarettes and e-cigarettes. These can delay incision or bone healing. If you need help quitting, ask your health care provider.  Keep all follow-up visits as told by your health care provider. This is important. Contact a health care provider if you:  Have a fever.  Have severe pain.  Have redness around an incision.  Have more swelling or pain in an incision area.  Have blood or more fluid coming from an incision.  Notice that an incision feels warm to the touch.  Notice pus or a bad smell coming from an incision.  Notice that an incision has opened up.  Develop a rash. Get help right away if you:  Have difficulty breathing.  Have chest pain.  Notice that your fingers tingle, are numb, or are cold and blue even after you loosen your sling or immobilizer.  Develop pain in your lower leg or at the back of your knee. Summary  If you have a sling or immobilizer, wear it as told by your health care provider. These devices protect your shoulder and help it heal by keeping it in place.  If lying down flat causes shoulder discomfort, it may help to sleep in a sitting position for a few days after your procedure. Try sleeping in a reclining chair, or try propping yourself up with extra pillows in bed.  If physical therapy was prescribed, do exercises as directed. Doing exercises may help to improve shoulder movement and flexibility (range of motion).  Keep all follow-up visits as told by your health care provider. This is important. This information is not intended to replace advice given to you by your health care provider. Make sure you discuss any questions you have with your health care provider. Document Revised: 01/21/2017 Document Reviewed: 12/24/2016 Elsevier Patient Education  [place] Anesthesia Blocks  1. Numbness or the inability to move the "blocked" extremity may last from 3-48 hours after placement. The length of time depends on the medication injected and your individual response to the medication. If the numbness is not going away after 48 hours, call your surgeon.  2. The extremity that is blocked will need to be protected until the numbness is gone and the  Strength has returned. Because you cannot feel it, you will need to take extra care to avoid injury. Because it may be weak, you may have difficulty moving it or using it. You may not know what position it is in without looking at it while the block is in effect.  3. For blocks in the legs and feet, returning to weight bearing and walking needs to be done carefully. You will need to wait until the numbness is entirely gone and the strength has returned. You should be able to move your leg and foot  normally before you try and bear weight or walk. You will need someone to be with you when you first try to ensure you do not fall and possibly risk injury.  4. Bruising and tenderness at the needle site are common side effects and will resolve in a few days.  5. Persistent numbness or new problems with movement should be communicated to the surgeon or the [facility] [phone] [place] [phone]).  Information for Discharge Teaching: EXPAREL (bupivacaine liposome injectable suspension)   Your surgeon or anesthesiologist gave you EXPAREL(bupivacaine) to help control your pain after surgery.   EXPAREL is a local anesthetic that provides pain relief by numbing the tissue around the surgical site.  EXPAREL is designed to release pain medication over time and can control pain for up to 72 hours.  Depending on how you respond to EXPAREL, you may require less pain medication during your recovery.  Possible side effects:  Temporary loss of sensation or ability  to move in the area where bupivacaine was injected.  Nausea, vomiting, constipation  Rarely, numbness and tingling in your mouth or lips, lightheadedness, or anxiety may occur.  Call your doctor right away if you think you may be experiencing any of these sensations, or if you have other questions regarding possible side effects.  Follow all other discharge instructions given to you by your surgeon or nurse. Eat a healthy diet and drink plenty of water or other fluids.  If you return to the hospital for any reason within 96 hours following the administration of EXPAREL, it is important for health care providers to know that you have received this anesthetic. A teal colored band has been placed on your arm with the date, time and amount of EXPAREL you have received in order to alert and inform your health care providers. Please leave this armband in place for the full 96 hours following administration, and then you may remove the band.  Post Anesthesia Home Care Instructions  Activity: Get plenty of rest for the remainder of the day. A responsible individual must stay with you for 24 hours following the procedure.  For the next 24 hours, DO NOT: -Drive a car -Paediatric nurse -Drink alcoholic beverages -Take any medication unless instructed by your physician -Make any legal decisions or sign important papers.  Meals: Start with liquid foods such as gelatin or soup. Progress to regular foods as tolerated. Avoid greasy, spicy, heavy foods. If nausea and/or vomiting occur, drink only clear liquids until the nausea and/or vomiting subsides. Call your physician if vomiting continues.  Special Instructions/Symptoms: Your throat may feel dry or sore from the anesthesia or the breathing tube placed in your throat during surgery. If this causes discomfort, gargle with warm salt water. The discomfort should disappear within 24 hours.

## 2019-08-22 NOTE — Anesthesia Postprocedure Evaluation (Signed)
Anesthesia Post Note  Patient: Marissa [name]  Procedure(s) Performed: SHOULDER ARTHROSCOPY WITH DEBRIDEMENT EXTENSIVE (Left Shoulder) LYSIS OF ADHESION AND MANIPULATION (Left Shoulder)     Patient location during evaluation: PACU Anesthesia Type: General Level of consciousness: awake and alert and oriented Pain management: pain level controlled Vital Signs Assessment: post-procedure vital signs reviewed and stable Respiratory status: spontaneous breathing, nonlabored ventilation and respiratory function stable Cardiovascular status: blood pressure returned to baseline and stable Postop Assessment: no apparent nausea or vomiting Anesthetic complications: no   No complications documented.  Last Vitals:  Vitals:   08/22/19 1215 08/22/19 1230  BP: 137/84 114/75  Pulse: 86 86  Resp: 19 14  Temp:    SpO2: 97% 91%    Last Pain:  Vitals:   08/22/19 1230  TempSrc:   PainSc: 1                  Lamaj Metoyer A.

## 2019-08-22 NOTE — Anesthesia Procedure Notes (Signed)
Anesthesia Regional Block: Interscalene brachial plexus block   Pre-Anesthetic Checklist: ,, timeout performed, Correct Patient, Correct Site, Correct Laterality, Correct Procedure, Correct Position, site marked, Risks and benefits discussed,  Surgical consent,  Pre-op evaluation,  At surgeon's request and post-op pain management  Laterality: Left  Prep: chloraprep       Needles:  Injection technique: Single-shot  Needle Type: Echogenic Stimulator Needle     Needle Length: 9cm  Needle Gauge: 21   Needle insertion depth: 7 cm   Additional Needles:   Procedures:,,,, ultrasound used (permanent image in chart),,,,  Narrative:  Start time: 08/22/2019 9:45 AM End time: 08/22/2019 9:50 AM Injection made incrementally with aspirations every 5 mL.  Performed by: Personally  Anesthesiologist: Josephine Igo, MD  Additional Notes: Timeout performed. Patient sedated. Relevant anatomy ID'd using [place]. Incremental 2-34ml injection of LA with frequent aspiration. Patient tolerated procedure well.        Left Interscalene Block

## 2019-08-22 NOTE — Anesthesia Procedure Notes (Signed)
Procedure Name: Intubation Date/Time: 08/22/2019 11:05 AM Performed by: Genelle Bal, CRNA Pre-anesthesia Checklist: Patient identified, Emergency Drugs available, Suction available and Patient being monitored Patient Re-evaluated:Patient Re-evaluated prior to induction Oxygen Delivery Method: Circle system utilized Preoxygenation: Pre-oxygenation with 100% oxygen Induction Type: IV induction Ventilation: Mask ventilation without difficulty Laryngoscope Size: Miller and 2 Grade View: Grade I Tube type: Oral Tube size: 7.0 mm Number of attempts: 1 Airway Equipment and Method: Stylet and Oral airway Placement Confirmation: ETT inserted through vocal cords under direct vision,  positive ETCO2 and breath sounds checked- equal and bilateral Secured at: 21 cm Tube secured with: Tape Dental Injury: Teeth and Oropharynx as per pre-operative assessment

## 2019-08-23 ENCOUNTER — Encounter (HOSPITAL_BASED_OUTPATIENT_CLINIC_OR_DEPARTMENT_OTHER): Payer: Self-pay | Admitting: Orthopaedic Surgery

## 2019-08-23 DIAGNOSIS — M7502 Adhesive capsulitis of left shoulder: Secondary | ICD-10-CM | POA: Diagnosis not present

## 2019-08-23 DIAGNOSIS — M25612 Stiffness of left shoulder, not elsewhere classified: Secondary | ICD-10-CM | POA: Diagnosis not present

## 2019-08-23 DIAGNOSIS — M7542 Impingement syndrome of left shoulder: Secondary | ICD-10-CM | POA: Diagnosis not present

## 2019-08-28 DIAGNOSIS — M25612 Stiffness of left shoulder, not elsewhere classified: Secondary | ICD-10-CM | POA: Diagnosis not present

## 2019-08-28 DIAGNOSIS — M7542 Impingement syndrome of left shoulder: Secondary | ICD-10-CM | POA: Diagnosis not present

## 2019-08-28 DIAGNOSIS — M7502 Adhesive capsulitis of left shoulder: Secondary | ICD-10-CM | POA: Diagnosis not present

## 2019-08-28 NOTE — Op Note (Signed)
Orthopaedic Surgery Operative Note (CSN: [id number])  Marissa [name]  1971-01-05 Date of Surgery: 08/22/2019   Diagnoses:  ADHESIVE CAPSULITIS OF LEFT SHOULDER  Procedure: Left shoulder lysis of adhesions Left shoulder manipulation under anesthesia Left shoulder extensive debridement   Operative Finding Preoperatively the patient had forward elevation passively to about 90 degrees, rotation to 0 postoperatively is 170 and 90 the joint was otherwise intact, there is significant subacromial bursitis but otherwise no major findings.  Cuff was intact as was the biceps.  Successful completion of the planned procedure.   Post-operative plan: The patient will be non-weightbearing in a sling for 3 days and then aggressive range of motion to start.  The patient will be discharged home.  DVT prophylaxis not indicated in ambulatory upper extremity patient without known risk factors.   Pain control with PRN pain medication preferring oral medicines.  Therapy to start date of surgery Post-Op Diagnosis: Same Surgeons:Primary: Hiram Gash, MD Assistants:Caroline McBane PA-C Location: [facility] OR ROOM 4. Anesthesia: General with Exparel interscalene block Antibiotics: Ancef 2 g Tourniquet time: None Estimated Blood Loss: Minimal Complications: None Specimens: None Implants: * No implants in log *  Indications for Surgery:   Marissa [name] is a 49 y.o. female with continued shoulder pain refractory to nonoperative measures for extended period of time.  She had MRI and clinical findings consistent with adhesive capsulitis.  The risks and benefits were explained at length including but not limited to continued pain, cuff failure, biceps tenodesis failure, stiffness, need for further surgery and infection.   Procedure:   Patient was correctly identified in the preoperative holding area and operative site marked.  Patient brought to OR and positioned beachchair on an [name] table ensuring that all  bony prominences were padded and the head was in an appropriate location.  Anesthesia was induced and the operative shoulder was prepped and draped in the usual sterile fashion.  Timeout was called preincision.  A standard posterior viewing portal was made after localizing the portal with a spinal needle.  An anterior accessory portal was also made.  After clearing the articular space the camera was positioned in the subacromial space.  Findings above.    Extensive debridement was performed of the anterior interval tissue, labral fraying and the bursa.   We performed a capsulectomy using direct visualization and careful precise dissection using an arthroscopic basket and RF ablator.  We completed this 270 degrees.  Were happy with our release and then for manipulation under anesthesia without issue.  Placed the scope back into verify that the cuff was intact.  Went subacromial and performed a bursectomy.  The incisions were closed with absorbable monocryl and steri strips.  A sterile dressing was placed along with a sling. The patient was awoken from general anesthesia and taken to the PACU in stable condition without complication.   Noemi Chapel, PA-C, present and scrubbed throughout the case, critical for completion in a timely fashion, and for retraction, instrumentation, closure.

## 2019-08-29 DIAGNOSIS — M7542 Impingement syndrome of left shoulder: Secondary | ICD-10-CM | POA: Diagnosis not present

## 2019-08-29 DIAGNOSIS — M7502 Adhesive capsulitis of left shoulder: Secondary | ICD-10-CM | POA: Diagnosis not present

## 2019-08-29 DIAGNOSIS — M25612 Stiffness of left shoulder, not elsewhere classified: Secondary | ICD-10-CM | POA: Diagnosis not present

## 2019-08-30 DIAGNOSIS — M7502 Adhesive capsulitis of left shoulder: Secondary | ICD-10-CM | POA: Diagnosis not present

## 2019-08-30 DIAGNOSIS — M25612 Stiffness of left shoulder, not elsewhere classified: Secondary | ICD-10-CM | POA: Diagnosis not present

## 2019-08-30 DIAGNOSIS — M7542 Impingement syndrome of left shoulder: Secondary | ICD-10-CM | POA: Diagnosis not present

## 2019-08-31 DIAGNOSIS — M25612 Stiffness of left shoulder, not elsewhere classified: Secondary | ICD-10-CM | POA: Diagnosis not present

## 2019-08-31 DIAGNOSIS — M7502 Adhesive capsulitis of left shoulder: Secondary | ICD-10-CM | POA: Diagnosis not present

## 2019-08-31 DIAGNOSIS — M7542 Impingement syndrome of left shoulder: Secondary | ICD-10-CM | POA: Diagnosis not present

## 2019-09-03 DIAGNOSIS — M25612 Stiffness of left shoulder, not elsewhere classified: Secondary | ICD-10-CM | POA: Diagnosis not present

## 2019-09-03 DIAGNOSIS — M7542 Impingement syndrome of left shoulder: Secondary | ICD-10-CM | POA: Diagnosis not present

## 2019-09-03 DIAGNOSIS — M7502 Adhesive capsulitis of left shoulder: Secondary | ICD-10-CM | POA: Diagnosis not present

## 2019-09-04 DIAGNOSIS — M25612 Stiffness of left shoulder, not elsewhere classified: Secondary | ICD-10-CM | POA: Diagnosis not present

## 2019-09-04 DIAGNOSIS — M7502 Adhesive capsulitis of left shoulder: Secondary | ICD-10-CM | POA: Diagnosis not present

## 2019-09-04 DIAGNOSIS — M7542 Impingement syndrome of left shoulder: Secondary | ICD-10-CM | POA: Diagnosis not present

## 2019-09-05 DIAGNOSIS — M7542 Impingement syndrome of left shoulder: Secondary | ICD-10-CM | POA: Diagnosis not present

## 2019-09-05 DIAGNOSIS — M25612 Stiffness of left shoulder, not elsewhere classified: Secondary | ICD-10-CM | POA: Diagnosis not present

## 2019-09-05 DIAGNOSIS — M7502 Adhesive capsulitis of left shoulder: Secondary | ICD-10-CM | POA: Diagnosis not present

## 2019-09-06 DIAGNOSIS — M25612 Stiffness of left shoulder, not elsewhere classified: Secondary | ICD-10-CM | POA: Diagnosis not present

## 2019-09-06 DIAGNOSIS — M7542 Impingement syndrome of left shoulder: Secondary | ICD-10-CM | POA: Diagnosis not present

## 2019-09-06 DIAGNOSIS — M7502 Adhesive capsulitis of left shoulder: Secondary | ICD-10-CM | POA: Diagnosis not present

## 2019-09-10 DIAGNOSIS — M7542 Impingement syndrome of left shoulder: Secondary | ICD-10-CM | POA: Diagnosis not present

## 2019-09-10 DIAGNOSIS — M7502 Adhesive capsulitis of left shoulder: Secondary | ICD-10-CM | POA: Diagnosis not present

## 2019-09-10 DIAGNOSIS — M25612 Stiffness of left shoulder, not elsewhere classified: Secondary | ICD-10-CM | POA: Diagnosis not present

## 2019-09-12 DIAGNOSIS — M25612 Stiffness of left shoulder, not elsewhere classified: Secondary | ICD-10-CM | POA: Diagnosis not present

## 2019-09-12 DIAGNOSIS — M7502 Adhesive capsulitis of left shoulder: Secondary | ICD-10-CM | POA: Diagnosis not present

## 2019-09-12 DIAGNOSIS — M7542 Impingement syndrome of left shoulder: Secondary | ICD-10-CM | POA: Diagnosis not present

## 2019-10-01 DIAGNOSIS — M25612 Stiffness of left shoulder, not elsewhere classified: Secondary | ICD-10-CM | POA: Diagnosis not present

## 2019-10-01 DIAGNOSIS — M7502 Adhesive capsulitis of left shoulder: Secondary | ICD-10-CM | POA: Diagnosis not present

## 2019-10-01 DIAGNOSIS — M7542 Impingement syndrome of left shoulder: Secondary | ICD-10-CM | POA: Diagnosis not present

## 2019-10-03 DIAGNOSIS — M25612 Stiffness of left shoulder, not elsewhere classified: Secondary | ICD-10-CM | POA: Diagnosis not present

## 2019-10-03 DIAGNOSIS — M7502 Adhesive capsulitis of left shoulder: Secondary | ICD-10-CM | POA: Diagnosis not present

## 2019-10-03 DIAGNOSIS — M7542 Impingement syndrome of left shoulder: Secondary | ICD-10-CM | POA: Diagnosis not present

## 2019-10-08 DIAGNOSIS — M25612 Stiffness of left shoulder, not elsewhere classified: Secondary | ICD-10-CM | POA: Diagnosis not present

## 2019-10-08 DIAGNOSIS — M7502 Adhesive capsulitis of left shoulder: Secondary | ICD-10-CM | POA: Diagnosis not present

## 2019-10-08 DIAGNOSIS — M7542 Impingement syndrome of left shoulder: Secondary | ICD-10-CM | POA: Diagnosis not present

## 2019-10-10 DIAGNOSIS — M7502 Adhesive capsulitis of left shoulder: Secondary | ICD-10-CM | POA: Diagnosis not present

## 2019-10-10 DIAGNOSIS — M25612 Stiffness of left shoulder, not elsewhere classified: Secondary | ICD-10-CM | POA: Diagnosis not present

## 2019-10-10 DIAGNOSIS — M7542 Impingement syndrome of left shoulder: Secondary | ICD-10-CM | POA: Diagnosis not present

## 2019-10-14 ENCOUNTER — Other Ambulatory Visit: Payer: Self-pay | Admitting: Oncology

## 2019-10-15 ENCOUNTER — Other Ambulatory Visit: Payer: Self-pay | Admitting: Oncology

## 2019-10-17 DIAGNOSIS — M7542 Impingement syndrome of left shoulder: Secondary | ICD-10-CM | POA: Diagnosis not present

## 2019-10-17 DIAGNOSIS — M25612 Stiffness of left shoulder, not elsewhere classified: Secondary | ICD-10-CM | POA: Diagnosis not present

## 2019-10-17 DIAGNOSIS — M7502 Adhesive capsulitis of left shoulder: Secondary | ICD-10-CM | POA: Diagnosis not present

## 2019-10-23 DIAGNOSIS — M7542 Impingement syndrome of left shoulder: Secondary | ICD-10-CM | POA: Diagnosis not present

## 2019-10-23 DIAGNOSIS — M25612 Stiffness of left shoulder, not elsewhere classified: Secondary | ICD-10-CM | POA: Diagnosis not present

## 2019-10-23 DIAGNOSIS — M7502 Adhesive capsulitis of left shoulder: Secondary | ICD-10-CM | POA: Diagnosis not present

## 2019-10-24 DIAGNOSIS — M7542 Impingement syndrome of left shoulder: Secondary | ICD-10-CM | POA: Diagnosis not present

## 2019-10-24 DIAGNOSIS — M7502 Adhesive capsulitis of left shoulder: Secondary | ICD-10-CM | POA: Diagnosis not present

## 2019-10-24 DIAGNOSIS — M25612 Stiffness of left shoulder, not elsewhere classified: Secondary | ICD-10-CM | POA: Diagnosis not present

## 2019-10-26 MED ORDER — RIVAROXABAN 20 MG PO TABS: 20.0000 mg | ORAL_TABLET | Freq: Every day | ORAL | 0 refills | Status: DC

## 2019-11-05 ENCOUNTER — Other Ambulatory Visit: Payer: Self-pay | Admitting: Family Medicine

## 2019-11-05 DIAGNOSIS — Z1231 Encounter for screening mammogram for malignant neoplasm of breast: Secondary | ICD-10-CM

## 2019-11-12 DIAGNOSIS — M25612 Stiffness of left shoulder, not elsewhere classified: Secondary | ICD-10-CM | POA: Diagnosis not present

## 2019-11-12 DIAGNOSIS — M7502 Adhesive capsulitis of left shoulder: Secondary | ICD-10-CM | POA: Diagnosis not present

## 2019-11-12 DIAGNOSIS — M7542 Impingement syndrome of left shoulder: Secondary | ICD-10-CM | POA: Diagnosis not present

## 2019-11-14 ENCOUNTER — Ambulatory Visit (INDEPENDENT_AMBULATORY_CARE_PROVIDER_SITE_OTHER): Payer: BC Managed Care – PPO | Admitting: Nurse Practitioner

## 2019-11-14 ENCOUNTER — Other Ambulatory Visit: Payer: Self-pay

## 2019-11-14 ENCOUNTER — Encounter: Payer: Self-pay | Admitting: Nurse Practitioner

## 2019-11-14 ENCOUNTER — Encounter: Payer: BC Managed Care – PPO | Admitting: Nurse Practitioner

## 2019-11-14 VITALS — BP 124/78 | Ht 66.0 in | Wt 224.0 lb

## 2019-11-14 DIAGNOSIS — Z01419 Encounter for gynecological examination (general) (routine) without abnormal findings: Secondary | ICD-10-CM | POA: Diagnosis not present

## 2019-11-14 DIAGNOSIS — M7542 Impingement syndrome of left shoulder: Secondary | ICD-10-CM | POA: Diagnosis not present

## 2019-11-14 DIAGNOSIS — Z23 Encounter for immunization: Secondary | ICD-10-CM

## 2019-11-14 DIAGNOSIS — N951 Menopausal and female climacteric states: Secondary | ICD-10-CM

## 2019-11-14 DIAGNOSIS — M7502 Adhesive capsulitis of left shoulder: Secondary | ICD-10-CM | POA: Diagnosis not present

## 2019-11-14 DIAGNOSIS — M25612 Stiffness of left shoulder, not elsewhere classified: Secondary | ICD-10-CM | POA: Diagnosis not present

## 2019-11-14 DIAGNOSIS — E785 Hyperlipidemia, unspecified: Secondary | ICD-10-CM | POA: Diagnosis not present

## 2019-11-14 NOTE — Patient Instructions (Addendum)
Schedule colonscopy! [facility] [phone] [place], [place] [address]  Health Maintenance, Female Adopting a healthy lifestyle and getting preventive care are important in promoting health and wellness. Ask your health care provider about:  The right schedule for you to have regular tests and exams.  Things you can do on your own to prevent diseases and keep yourself healthy. What should I know about diet, weight, and exercise? Eat a healthy diet   Eat a diet that includes plenty of vegetables, fruits, low-fat dairy products, and lean protein.  Do not eat a lot of foods that are high in solid fats, added sugars, or sodium. Maintain a healthy weight Body mass index (BMI) is used to identify weight problems. It estimates body fat based on height and weight. Your health care provider can help determine your BMI and help you achieve or maintain a healthy weight. Get regular exercise Get regular exercise. This is one of the most important things you can do for your health. Most adults should:  Exercise for at least 150 minutes each week. The exercise should increase your heart rate and make you sweat (moderate-intensity exercise).  Do strengthening exercises at least twice a week. This is in addition to the moderate-intensity exercise.  Spend less time sitting. Even light physical activity can be beneficial. Watch cholesterol and blood lipids Have your blood tested for lipids and cholesterol at 50 years of age, then have this test every 5 years. Have your cholesterol levels checked more often if:  Your lipid or cholesterol levels are high.  You are older than 50 years of age.  You are at high risk for heart disease. What should I know about cancer screening? Depending on your health history and family history, you may need to have cancer screening at various ages. This may include screening for:  Breast cancer.  Cervical cancer.  Colorectal cancer.  Skin  cancer.  Lung cancer. What should I know about heart disease, diabetes, and high blood pressure? Blood pressure and heart disease  High blood pressure causes heart disease and increases the risk of stroke. This is more likely to develop in people who have high blood pressure readings, are of African descent, or are overweight.  Have your blood pressure checked: ? Every 3-5 years if you are 6-85 years of age. ? Every year if you are 19 years old or older. Diabetes Have regular diabetes screenings. This checks your fasting blood sugar level. Have the screening done:  Once every three years after age 12 if you are at a normal weight and have a low risk for diabetes.  More often and at a younger age if you are overweight or have a high risk for diabetes. What should I know about preventing infection? Hepatitis B If you have a higher risk for hepatitis B, you should be screened for this virus. Talk with your health care provider to find out if you are at risk for hepatitis B infection. Hepatitis C Testing is recommended for:  Everyone born from [birth year] through 1966.  Anyone with known risk factors for hepatitis C. Sexually transmitted infections (STIs)  Get screened for STIs, including gonorrhea and chlamydia, if: ? You are sexually active and are younger than 50 years of age. ? You are older than 50 years of age and your health care provider tells you that you are at risk for this type of infection. ? Your sexual activity has changed since you were last screened, and you  are at increased risk for chlamydia or gonorrhea. Ask your health care provider if you are at risk.  Ask your health care provider about whether you are at high risk for HIV. Your health care provider may recommend a prescription medicine to help prevent HIV infection. If you choose to take medicine to prevent HIV, you should first get tested for HIV. You should then be tested every 3 months for as long as you are taking  the medicine. Pregnancy  If you are about to stop having your period (premenopausal) and you may become pregnant, seek counseling before you get pregnant.  Take 400 to 800 micrograms (mcg) of folic acid every day if you become pregnant.  Ask for birth control (contraception) if you want to prevent pregnancy. Osteoporosis and menopause Osteoporosis is a disease in which the bones lose minerals and strength with aging. This can result in bone fractures. If you are 2 years old or older, or if you are at risk for osteoporosis and fractures, ask your health care provider if you should:  Be screened for bone loss.  Take a calcium or vitamin D supplement to lower your risk of fractures.  Be given hormone replacement therapy (HRT) to treat symptoms of menopause. Follow these instructions at home: Lifestyle  Do not use any products that contain nicotine or tobacco, such as cigarettes, e-cigarettes, and chewing tobacco. If you need help quitting, ask your health care provider.  Do not use street drugs.  Do not share needles.  Ask your health care provider for help if you need support or information about quitting drugs. Alcohol use  Do not drink alcohol if: ? Your health care provider tells you not to drink. ? You are pregnant, may be pregnant, or are planning to become pregnant.  If you drink alcohol: ? Limit how much you use to 0-1 drink a day. ? Limit intake if you are breastfeeding.  Be aware of how much alcohol is in your drink. In the [place] one drink equals one 12 oz bottle of beer (355 mL), one 5 oz glass of wine (148 mL), or one 1 oz glass of hard liquor (44 mL). General instructions  Schedule regular health, dental, and eye exams.  Stay current with your vaccines.  Tell your health care provider if: ? You often feel depressed. ? You have ever been abused or do not feel safe at home. Summary  Adopting a healthy lifestyle and getting preventive care are important in  promoting health and wellness.  Follow your health care provider's instructions about healthy diet, exercising, and getting tested or screened for diseases.  Follow your health care provider's instructions on monitoring your cholesterol and blood pressure. This information is not intended to replace advice given to you by your health care provider. Make sure you discuss any questions you have with your health care provider. Document Revised: 02/01/2018 Document Reviewed: 02/01/2018 Elsevier Patient Education  2020 [employer].  Menopause Menopause is the normal time of life when menstrual periods stop completely. It is usually confirmed by 12 months without a menstrual period. The transition to menopause (perimenopause) most often happens between the ages of 79 and 7. During perimenopause, hormone levels change in your body, which can cause symptoms and affect your health. Menopause may increase your risk for:  Loss of bone (osteoporosis), which causes bone breaks (fractures).  Depression.  Hardening and narrowing of the arteries (atherosclerosis), which can cause heart attacks and strokes. What are the causes? This condition  is usually caused by a natural change in hormone levels that happens as you get older. The condition may also be caused by surgery to remove both ovaries (bilateral oophorectomy). What increases the risk? This condition is more likely to start at an earlier age if you have certain medical conditions or treatments, including:  A tumor of the pituitary gland in the brain.  A disease that affects the ovaries and hormone production.  Radiation treatment for cancer.  Certain cancer treatments, such as chemotherapy or hormone (anti-estrogen) therapy.  Heavy smoking and excessive alcohol use.  Family history of early menopause. This condition is also more likely to develop earlier in women who are very thin. What are the signs or symptoms? Symptoms of this  condition include:  Hot flashes.  Irregular menstrual periods.  Night sweats.  Changes in feelings about sex. This could be a decrease in sex drive or an increased comfort around your sexuality.  Vaginal dryness and thinning of the vaginal walls. This may cause painful intercourse.  Dryness of the skin and development of wrinkles.  Headaches.  Problems sleeping (insomnia).  Mood swings or irritability.  Memory problems.  Weight gain.  Hair growth on the face and chest.  Bladder infections or problems with urinating. How is this diagnosed? This condition is diagnosed based on your medical history, a physical exam, your age, your menstrual history, and your symptoms. Hormone tests may also be done. How is this treated? In some cases, no treatment is needed. You and your health care provider should make a decision together about whether treatment is necessary. Treatment will be based on your individual condition and preferences. Treatment for this condition focuses on managing symptoms. Treatment may include:  Menopausal hormone therapy (MHT).  Medicines to treat specific symptoms or complications.  Acupuncture.  Vitamin or herbal supplements. Before starting treatment, make sure to let your health care provider know if you have a personal or family history of:  Heart disease.  Breast cancer.  Blood clots.  Diabetes.  Osteoporosis. Follow these instructions at home: Lifestyle  Do not use any products that contain nicotine or tobacco, such as cigarettes and e-cigarettes. If you need help quitting, ask your health care provider.  Get at least 30 minutes of physical activity on 5 or more days each week.  Avoid alcoholic and caffeinated beverages, as well as spicy foods. This may help prevent hot flashes.  Get 7-8 hours of sleep each night.  If you have hot flashes, try: ? Dressing in layers. ? Avoiding things that may trigger hot flashes, such as spicy food,  warm places, or stress. ? Taking slow, deep breaths when a hot flash starts. ? Keeping a fan in your home and office.  Find ways to manage stress, such as deep breathing, meditation, or journaling.  Consider going to group therapy with other women who are having menopause symptoms. Ask your health care provider about recommended group therapy meetings. Eating and drinking  Eat a healthy, balanced diet that contains whole grains, lean protein, low-fat dairy, and plenty of fruits and vegetables.  Your health care provider may recommend adding more soy to your diet. Foods that contain soy include tofu, tempeh, and soy milk.  Eat plenty of foods that contain calcium and vitamin D for bone health. Items that are rich in calcium include low-fat milk, yogurt, beans, almonds, sardines, broccoli, and kale. Medicines  Take over-the-counter and prescription medicines only as told by your health care provider.  Talk with your  health care provider before starting any herbal supplements. If prescribed, take vitamins and supplements as told by your health care provider. These may include: ? Calcium. Women age 39 and older should get 1,200 mg (milligrams) of calcium every day. ? Vitamin D. Women need 600-800 International Units of vitamin D each day. ? Vitamins B12 and B6. Aim for 50 micrograms of B12 and 1.5 mg of B6 each day. General instructions  Keep track of your menstrual periods, including: ? When they occur. ? How heavy they are and how long they last. ? How much time passes between periods.  Keep track of your symptoms, noting when they start, how often you have them, and how long they last.  Use vaginal lubricants or moisturizers to help with vaginal dryness and improve comfort during sex.  Keep all follow-up visits as told by your health care provider. This is important. This includes any group therapy or counseling. Contact a health care provider if:  You are still having menstrual  periods after age 88.  You have pain during sex.  You have not had a period for 12 months and you develop vaginal bleeding. Get help right away if:  You have: ? Severe depression. ? Excessive vaginal bleeding. ? Pain when you urinate. ? A fast or irregular heart beat (palpitations). ? Severe headaches. ? Abdomen (abdominal) pain or severe indigestion.  You fell and you think you have a broken bone.  You develop leg or chest pain.  You develop vision problems.  You feel a lump in your breast. Summary  Menopause is the normal time of life when menstrual periods stop completely. It is usually confirmed by 12 months without a menstrual period.  The transition to menopause (perimenopause) most often happens between the ages of 37 and 68.  Symptoms can be managed through medicines, lifestyle changes, and complementary therapies such as acupuncture.  Eat a balanced diet that is rich in nutrients to promote bone health and heart health and to manage symptoms during menopause. This information is not intended to replace advice given to you by your health care provider. Make sure you discuss any questions you have with your health care provider. Document Revised: 01/21/2017 Document Reviewed: 03/13/2016 Elsevier Patient Education  2020 [employer].

## 2019-11-14 NOTE — Progress Notes (Signed)
   Marissa [name] 11/11/1970 [id number]   History:  49 y.o. G1P0 presents for annual exam without GYN complaints. 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 LMP March 2021, denies menopausal symptoms. 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 Iron deficiency anemia managed by hematology, iron transfusions in the past. Not currently sexually active.   Gynecologic History Patient's last menstrual period was 04/23/2019 (approximate).   Contraception: none Last Pap: 11/01/2017. Results were: normal Last mammogram: 12/16/2018. Results were: normal  Last colonoscopy: Never   Past medical history, past surgical history, family history and social history were all reviewed and documented in the EPIC chart.  ROS:  A ROS was performed and pertinent positives and negatives are included.  Exam:  Vitals:   11/14/19 1032  BP: 124/78  Weight: 224 lb (101.6 kg)  Height: 5\' 6"  (1.676 m)   Body mass index is 36.15 kg/m.  General appearance:  Normal Thyroid:  Symmetrical, normal in size, without palpable masses or nodularity. Respiratory  Auscultation:  Clear without wheezing or rhonchi Cardiovascular  Auscultation:  Regular rate, without rubs, murmurs or gallops  Edema/varicosities:  Not grossly evident Abdominal  Soft,nontender, without masses, guarding or rebound.  Liver/spleen:  No organomegaly noted  Hernia:  None appreciated  Skin  Inspection:  Grossly normal   Breasts: Examined lying and sitting.   Right: Without masses, retractions, discharge or axillary adenopathy.   Left: Without masses, retractions, discharge or axillary adenopathy. Gentitourinary   Inguinal/mons:  Normal without inguinal adenopathy  External genitalia:  Normal  BUS/Urethra/Skene's glands:  Normal  Vagina:  Normal  Cervix:  Normal  Uterus:  Difficult to palpate due to body habitus but no gross masses or tenderness  Adnexa/parametria:     Rt: Without masses or tenderness.   Lt: Without masses or  tenderness.  Anus and perineum: Normal, non-bleeding external hemorrhoid   Digital rectal exam: Normal sphincter tone without palpated masses or tenderness  Assessment/Plan:  49 y.o. G1P0 for annual exam.   Well female exam with routine gynecological exam - Plan: CBC with Differential/Platelet, Comprehensive metabolic panel, Lipid panel. Education provided on SBEs, importance of preventative screenings, current guidelines, high calcium diet, regular exercise, and multivitamin daily. Congratulated on 15 pound weight loss.   Need for immunization against influenza - Plan: Flu Vaccine QUAD 36+ mos IM (Fluarix, Quad PF)  Perimenopause - Plan: Follicle stimulating hormone. Education provided on menopause and what to expect. LMP 04/2019, no menopausal symptoms.   Follow up in 1 year for annual     [place], 11:00 AM 11/14/2019

## 2019-11-16 LAB — CBC WITH DIFFERENTIAL/PLATELET
Absolute Monocytes: 240 cells/uL (ref 200–950)
Basophils Absolute: 19 cells/uL (ref 0–200)
Basophils Relative: 0.6 %
Eosinophils Absolute: 51 cells/uL (ref 15–500)
Eosinophils Relative: 1.6 %
HCT: 39.7 % (ref 35.0–45.0)
Hemoglobin: 12.6 g/dL (ref 11.7–15.5)
Lymphs Abs: 1398 cells/uL (ref 850–3900)
MCH: 27 pg (ref 27.0–33.0)
MCHC: 31.7 g/dL — ABNORMAL LOW (ref 32.0–36.0)
MCV: 85.2 fL (ref 80.0–100.0)
MPV: 10.7 fL (ref 7.5–12.5)
Monocytes Relative: 7.5 %
Neutro Abs: 1491 cells/uL — ABNORMAL LOW (ref 1500–7800)
Neutrophils Relative %: 46.6 %
Platelets: 237 10*3/uL (ref 140–400)
RBC: 4.66 10*6/uL (ref 3.80–5.10)
RDW: 12.5 % (ref 11.0–15.0)
Total Lymphocyte: 43.7 %
WBC: 3.2 10*3/uL — ABNORMAL LOW (ref 3.8–10.8)

## 2019-11-16 LAB — HEMOGLOBIN A1C W/OUT EAG: Hgb A1c MFr Bld: 13 % of total Hgb — ABNORMAL HIGH (ref <5.7)

## 2019-11-16 LAB — COMPREHENSIVE METABOLIC PANEL
AG Ratio: 1.5 (calc) (ref 1.0–2.5)
ALT: 16 U/L (ref 6–29)
AST: 13 U/L (ref 10–35)
Albumin: 4.1 g/dL (ref 3.6–5.1)
Alkaline phosphatase (APISO): 115 U/L (ref 37–153)
BUN: 11 mg/dL (ref 7–25)
CO2: 25 mmol/L (ref 20–32)
Calcium: 9.8 mg/dL (ref 8.6–10.4)
Chloride: 102 mmol/L (ref 98–110)
Creat: 0.92 mg/dL (ref 0.50–1.05)
Globulin: 2.7 g/dL (calc) (ref 1.9–3.7)
Glucose, Bld: 280 mg/dL — ABNORMAL HIGH (ref 65–99)
Potassium: 3.8 mmol/L (ref 3.5–5.3)
Sodium: 139 mmol/L (ref 135–146)
Total Bilirubin: 0.4 mg/dL (ref 0.2–1.2)
Total Protein: 6.8 g/dL (ref 6.1–8.1)

## 2019-11-16 LAB — LIPID PANEL
Cholesterol: 183 mg/dL (ref <200)
HDL: 53 mg/dL (ref >=50)
LDL Cholesterol (Calc): 104 mg/dL (calc) — ABNORMAL HIGH
Non-HDL Cholesterol (Calc): 130 mg/dL (calc) — ABNORMAL HIGH (ref <130)
Total CHOL/HDL Ratio: 3.5 (calc) (ref <5.0)
Triglycerides: 138 mg/dL (ref <150)

## 2019-11-16 LAB — FOLLICLE STIMULATING HORMONE: FSH: 96.6 m[IU]/mL

## 2019-11-19 ENCOUNTER — Telehealth: Payer: Self-pay

## 2019-11-19 ENCOUNTER — Other Ambulatory Visit: Payer: Self-pay | Admitting: Nurse Practitioner

## 2019-11-19 ENCOUNTER — Telehealth: Payer: Self-pay | Admitting: *Deleted

## 2019-11-19 DIAGNOSIS — M7502 Adhesive capsulitis of left shoulder: Secondary | ICD-10-CM | POA: Diagnosis not present

## 2019-11-19 DIAGNOSIS — M25612 Stiffness of left shoulder, not elsewhere classified: Secondary | ICD-10-CM | POA: Diagnosis not present

## 2019-11-19 DIAGNOSIS — R7309 Other abnormal glucose: Secondary | ICD-10-CM

## 2019-11-19 DIAGNOSIS — M7542 Impingement syndrome of left shoulder: Secondary | ICD-10-CM | POA: Diagnosis not present

## 2019-11-19 MED ORDER — GLIPIZIDE 5 MG PO TABS: 5.0000 mg | ORAL_TABLET | Freq: Every day | ORAL | 2 refills | Status: DC

## 2019-11-19 MED ORDER — BLOOD GLUCOSE MONITOR KIT: PACK | 0 refills | Status: DC

## 2019-11-19 MED ORDER — METFORMIN HCL 500 MG PO TABS: 500.0000 mg | ORAL_TABLET | Freq: Two times a day (BID) | ORAL | 1 refills | Status: DC

## 2019-11-19 NOTE — Progress Notes (Signed)
A1C 13.0. Metformin, Glipizide and glucometer kit ordered. Will send urgent referral to endocrinology.

## 2019-11-19 NOTE — Telephone Encounter (Signed)
Yes I would like her to start them right away and there are no interactions between them and Xarelto. Thank you

## 2019-11-19 NOTE — Telephone Encounter (Signed)
-----   Message from Tamela Gammon, NP sent at 11/19/2019  8:06 AM EDT ----- Please send urgent referral to endocrinology for elevated A1C/new diabetes consult.

## 2019-11-19 NOTE — Telephone Encounter (Signed)
Referral placed at [facility] they will call to schedule.

## 2019-11-19 NOTE — Telephone Encounter (Signed)
Patient was checking to see if okay to take the medications you sent to pharmacy for her this morning. She takes Zarelto and wanted to make sure that is okay.

## 2019-11-19 NOTE — Telephone Encounter (Signed)
Spoke with patient and informed her. °

## 2019-11-21 DIAGNOSIS — M7542 Impingement syndrome of left shoulder: Secondary | ICD-10-CM | POA: Diagnosis not present

## 2019-11-21 DIAGNOSIS — M7502 Adhesive capsulitis of left shoulder: Secondary | ICD-10-CM | POA: Diagnosis not present

## 2019-11-21 DIAGNOSIS — M25612 Stiffness of left shoulder, not elsewhere classified: Secondary | ICD-10-CM | POA: Diagnosis not present

## 2019-11-23 NOTE — Telephone Encounter (Signed)
Appointment scheduled on 12/04/19 with Shamleffer, Melanie Crazier, MD

## 2019-11-26 DIAGNOSIS — M7502 Adhesive capsulitis of left shoulder: Secondary | ICD-10-CM | POA: Diagnosis not present

## 2019-11-26 DIAGNOSIS — M7542 Impingement syndrome of left shoulder: Secondary | ICD-10-CM | POA: Diagnosis not present

## 2019-11-26 DIAGNOSIS — M25612 Stiffness of left shoulder, not elsewhere classified: Secondary | ICD-10-CM | POA: Diagnosis not present

## 2019-12-04 ENCOUNTER — Ambulatory Visit: Payer: BC Managed Care – PPO | Admitting: Internal Medicine

## 2019-12-17 ENCOUNTER — Ambulatory Visit
Admission: RE | Admit: 2019-12-17 | Discharge: 2019-12-17 | Disposition: A | Discharge status: Home / Self Care | Payer: BC Managed Care – PPO | Source: Ambulatory Visit | Attending: Family Medicine | Admitting: Family Medicine

## 2019-12-17 ENCOUNTER — Other Ambulatory Visit: Payer: Self-pay

## 2019-12-17 DIAGNOSIS — Z1231 Encounter for screening mammogram for malignant neoplasm of breast: Secondary | ICD-10-CM | POA: Diagnosis not present

## 2020-01-31 ENCOUNTER — Encounter: Payer: Self-pay | Admitting: Internal Medicine

## 2020-01-31 ENCOUNTER — Telehealth: Payer: Self-pay | Admitting: Internal Medicine

## 2020-01-31 ENCOUNTER — Other Ambulatory Visit: Payer: Self-pay

## 2020-01-31 ENCOUNTER — Ambulatory Visit (INDEPENDENT_AMBULATORY_CARE_PROVIDER_SITE_OTHER): Payer: BC Managed Care – PPO | Admitting: Internal Medicine

## 2020-01-31 VITALS — BP 124/74 | HR 69 | Ht 66.0 in | Wt 226.1 lb

## 2020-01-31 DIAGNOSIS — E1165 Type 2 diabetes mellitus with hyperglycemia: Secondary | ICD-10-CM | POA: Diagnosis not present

## 2020-01-31 DIAGNOSIS — R7309 Other abnormal glucose: Secondary | ICD-10-CM

## 2020-01-31 DIAGNOSIS — E785 Hyperlipidemia, unspecified: Secondary | ICD-10-CM

## 2020-01-31 LAB — GLUCOSE, POCT (MANUAL RESULT ENTRY): POC Glucose: 262 mg/dl — AB (ref 70–99)

## 2020-01-31 LAB — POCT GLYCOSYLATED HEMOGLOBIN (HGB A1C): Hemoglobin A1C: 11 % — AB (ref 4.0–5.6)

## 2020-01-31 MED ORDER — LANTUS SOLOSTAR 100 UNIT/ML ~~LOC~~ SOPN: 16.0000 [IU] | PEN_INJECTOR | Freq: Every day | SUBCUTANEOUS | 11 refills | Status: DC

## 2020-01-31 MED ORDER — INSULIN PEN NEEDLE 32G X 4 MM MISC: 1.0000 | Freq: Every day | 6 refills | Status: DC

## 2020-01-31 MED ORDER — DAPAGLIFLOZIN PROPANEDIOL 5 MG PO TABS
5.0000 mg | ORAL_TABLET | Freq: Every day | ORAL | 4 refills | Status: DC
Start: 2020-01-31 — End: 2020-04-30

## 2020-01-31 MED ORDER — ACCU-CHEK SOFTCLIX LANCETS MISC
1.0000 | Freq: Four times a day (QID) | 12 refills | Status: DC
Start: 2020-01-31 — End: 2022-10-05

## 2020-01-31 MED ORDER — ACCU-CHEK GUIDE VI STRP
ORAL_STRIP | 12 refills | Status: DC
Start: 2020-01-31 — End: 2022-04-02

## 2020-01-31 NOTE — Patient Instructions (Addendum)
-   Start Farxiga 5  Mg , 1 Tablet daily before Breakfast  - Start Lantus 16 units daily   - Check sugar fasting    HOW TO TREAT LOW BLOOD SUGARS (Blood sugar LESS THAN 70 MG/DL)  Please follow the RULE OF 15 for the treatment of hypoglycemia treatment (when your (blood sugars are less than 70 mg/dL)    STEP 1: Take 15 grams of carbohydrates when your blood sugar is low, which includes:   3-4 GLUCOSE TABS  OR  3-4 OZ OF JUICE OR REGULAR SODA OR  ONE TUBE OF GLUCOSE GEL     STEP 2: RECHECK blood sugar in 15 MINUTES STEP 3: If your blood sugar is still low at the 15 minute recheck --> then, go back to STEP 1 and treat AGAIN with another 15 grams of carbohydrates.

## 2020-01-31 NOTE — Telephone Encounter (Signed)
Refills sent
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
## 2020-01-31 NOTE — Telephone Encounter (Signed)
Patient called to advise that she has an Accu Chek Guide Me meter and needs an RX sent for Accu Chek Guide Me test strips and lancets  [facility] on [address]

## 2020-02-01 ENCOUNTER — Telehealth: Payer: Self-pay | Admitting: Internal Medicine

## 2020-02-01 MED ORDER — DEXCOM G6 SENSOR MISC: 1.0000 | 11 refills | Status: DC

## 2020-02-01 MED ORDER — DEXCOM G6 RECEIVER DEVI: 1.0000 | 0 refills | Status: DC

## 2020-02-01 MED ORDER — DEXCOM G6 TRANSMITTER MISC: 1.0000 | 3 refills | Status: DC

## 2020-02-01 NOTE — Telephone Encounter (Signed)
Patient called to request a RX for Dexcom Continuous Glucose Monitor be sent to  [facility] ([address]), [address] Phone:  [phone]  Fax:  [phone]

## 2020-02-01 NOTE — Telephone Encounter (Signed)
done

## 2020-02-01 NOTE — Telephone Encounter (Signed)
Please advise if patient needs to have the Dexcom Meter as she is requesting.

## 2020-03-17 ENCOUNTER — Other Ambulatory Visit: Payer: Self-pay

## 2020-03-17 ENCOUNTER — Encounter: Payer: Self-pay | Admitting: Dietician

## 2020-03-17 ENCOUNTER — Encounter: Payer: BC Managed Care – PPO | Attending: Internal Medicine | Admitting: Dietician

## 2020-03-17 DIAGNOSIS — E1165 Type 2 diabetes mellitus with hyperglycemia: Secondary | ICD-10-CM | POA: Diagnosis not present

## 2020-03-17 NOTE — Progress Notes (Signed)
Diabetes Self-Management Education  Visit Type: First/Initial  Appt. Start Time: 0945 Appt. End Time: 1215  03/17/2020  [name], identified by name and date of birth, is a 50 y.o. female with a diagnosis of Diabetes: Type 2.   ASSESSMENT Patient is here today alone.  She would like to learn more about diabetes and how to control her blood sugar.  She states as her blood sugar is improving, she is has more energy and her sight has improved.  History includes newly diagnosed diabetes, history of PE. A1C 11% 01/31/2020 decreased from 11% 11/14/2019 Medications include Lantus 16 units q HS, Farxiga.  She was unable to tolerate Metformin or Glipizide. She uses a Dexcom as she was unable to prick her finger.  Weoght hx: 221 lbs 03/17/2020 226 lbs 01/31/2020 224 lbs 10/2019 240 lbs 02/2019  Highest adult weight. She states that she started to gain increased weight by drinking more sugar drinks and snacking on candy after she started to work from home in 2020. 225 lbs prior to 2020 181 lowest adult weight  Patient lives alone.  She works from home for [employer]. She recently purchased a stationary bike that she can fit under her desk. She is now cooking at home most of the times. She would like to learn more about meal planning at her next visit. Height 5\' 6"  (1.676 m), weight 221 lb (100.2 kg). Body mass index is 35.67 kg/m.   Diabetes Self-Management Education - 03/17/20 1000      Visit Information   Visit Type First/Initial      Initial Visit   Diabetes Type Type 2    Are you currently following a meal plan? No    Are you taking your medications as prescribed? Yes    Date Diagnosed 10/2019      Health Coping   How would you rate your overall health? Good      Psychosocial Assessment   Patient Belief/Attitude about Diabetes Motivated to manage diabetes    Self-care barriers None    Self-management support Doctor's office    Other persons present Patient     Patient Concerns Nutrition/Meal planning;Glycemic Control;Weight Control    Special Needs None    Preferred Learning Style No preference indicated    Learning Readiness Not Ready    How often do you need to have someone help you when you read instructions, pamphlets, or other written materials from your doctor or pharmacy? 1 - Never    What is the last grade level you completed in school? 4 years college      Pre-Education Assessment   Patient understands the diabetes disease and treatment process. Needs Instruction    Patient understands incorporating nutritional management into lifestyle. Needs Instruction    Patient undertands incorporating physical activity into lifestyle. Needs Instruction    Patient understands using medications safely. Needs Instruction    Patient understands monitoring blood glucose, interpreting and using results Needs Instruction    Patient understands prevention, detection, and treatment of acute complications. Needs Instruction    Patient understands prevention, detection, and treatment of chronic complications. Needs Instruction    Patient understands how to develop strategies to address psychosocial issues. Needs Instruction    Patient understands how to develop strategies to promote health/change behavior. Needs Instruction      Complications   Last HgB A1C per patient/outside source 11 %   01/2020 decreased from 10/2019   How often do you check your blood sugar? > 4  times/day    Fasting Blood glucose range (mg/dL) 70-129    Postprandial Blood glucose range (mg/dL) 130-179    Number of hypoglycemic episodes per month 1    Can you tell when your blood sugar is low? Yes    What do you do if your blood sugar is low? drink regular soda    Number of hyperglycemic episodes per week 0    Have you had a dilated eye exam in the past 12 months? No    Have you had a dental exam in the past 12 months? No    Are you checking your feet? Yes    How many days per week are  you checking your feet? 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 Malachi Bonds occasionally, sprite zero      Exercise   Exercise Type Light (walking / raking leaves)    How many days per week to you exercise? 4    How many minutes per day do you exercise? 15    Total minutes per week of exercise 60      Patient Education   Previous Diabetes Education No    Disease state  Definition of diabetes, type 1 and 2, and the diagnosis of diabetes    Nutrition management  Role of diet in the treatment of diabetes and the relationship between the three main macronutrients and blood glucose level;Food label reading, portion sizes and measuring food.;Carbohydrate counting;Meal options for control of blood glucose level and chronic complications.    Physical activity and exercise  Role of exercise on diabetes management, blood pressure control and cardiac health.    Medications Reviewed patients medication for diabetes, action, purpose, timing of dose and side effects.    Monitoring Yearly dilated eye exam;Daily foot exams;Taught/discussed recording of test results and interpretation of SMBG.;Identified appropriate SMBG and/or A1C goals.    Acute complications Taught treatment of hypoglycemia - the 15 rule.;Discussed and identified patients' treatment of hyperglycemia.    Chronic complications Relationship between chronic complications and blood glucose control    Psychosocial adjustment Role of stress on diabetes;Identified and addressed patients feelings and  concerns about diabetes      Individualized Goals (developed by patient)   Nutrition Follow meal plan discussed    Physical Activity Exercise 5-7 days per week;30 minutes per day    Medications take my medication as prescribed    Monitoring  test my blood glucose as discussed    Reducing Risk increase portions of healthy fats;examine blood glucose patterns;do foot checks daily      Post-Education Assessment   Patient understands the diabetes disease and treatment process. Demonstrates understanding / competency    Patient understands incorporating nutritional management into lifestyle. Needs Review    Patient undertands incorporating physical activity into lifestyle. Demonstrates understanding / competency    Patient understands using medications safely. Needs Review    Patient understands monitoring blood glucose, interpreting and  using results Needs Review    Patient understands prevention, detection, and treatment of acute complications. Demonstrates understanding / competency    Patient understands prevention, detection, and treatment of chronic complications. Demonstrates understanding / competency    Patient understands how to develop strategies to address psychosocial issues. Demonstrates understanding / competency    Patient understands how to develop strategies to promote health/change behavior. Needs Review      Outcomes   Expected Outcomes Demonstrated interest in learning. Expect positive outcomes    Future DMSE 4-6 wks    Program Status Completed           Individualized Plan for Diabetes Self-Management Training:   Learning Objective:  Patient will have a greater understanding of diabetes self-management. Patient education plan is to attend individual and/or group sessions per assessed needs and concerns.   Plan:   Patient Instructions  Doristine Devoid job on the changes that you have made!  Aim for 3 Carb Choices per meal (45 grams) +/- 1 either way  Aim for 0-1 Carbs per  snack if hungry.  Aim for snacks that have no carbohydrates most often if hungry.  Include protein in moderation with your meals and snacks Consider reading food labels for Total Carbohydrate of foods Consider  increasing your activity level by biking/walking for 30 minutes daily as tolerated Continue checking glucose and see the effect of food and exercise. Continue taking medication as directed by MD      Expected Outcomes:  Demonstrated interest in learning. Expect positive outcomes  Education material provided: ADA - How to Thrive: A Guide for Your Journey with Diabetes, Food label handouts, Meal plan card and Snack sheet  If problems or questions, patient to contact team via:  Phone  Future DSME appointment: 4-6 wks

## 2020-03-17 NOTE — Patient Instructions (Signed)
Great job on the changes that you have made!  Aim for 3 Carb Choices per meal (45 grams) +/- 1 either way  Aim for 0-1 Carbs per snack if hungry.  Aim for snacks that have no carbohydrates most often if hungry.  Include protein in moderation with your meals and snacks Consider reading food labels for Total Carbohydrate of foods Consider  increasing your activity level by biking/walking for 30 minutes daily as tolerated Continue checking glucose and see the effect of food and exercise. Continue taking medication as directed by MD

## 2020-04-04 ENCOUNTER — Encounter: Payer: Self-pay | Admitting: Internal Medicine

## 2020-04-07 ENCOUNTER — Telehealth: Payer: Self-pay | Admitting: Internal Medicine

## 2020-04-07 MED ORDER — INSULIN GLARGINE-YFGN 100 UNIT/ML ~~LOC~~ SOLN: 16.0000 [IU] | Freq: Every day | SUBCUTANEOUS | 6 refills | Status: DC

## 2020-04-07 NOTE — Telephone Encounter (Signed)
done

## 2020-04-07 NOTE — Telephone Encounter (Signed)
Noted  

## 2020-04-07 NOTE — Telephone Encounter (Signed)
Pt called in regards to her prescription. She wanted to leave a message for the nurse that her [place] PA isn't due until April 1st and asked if the nurse could just keep the prescription for her Lantus sent and she will pick it up tomorrow from the drugstore.

## 2020-04-18 ENCOUNTER — Other Ambulatory Visit: Payer: Self-pay

## 2020-04-18 ENCOUNTER — Telehealth: Payer: Self-pay | Admitting: Dietician

## 2020-04-18 ENCOUNTER — Encounter: Payer: BC Managed Care – PPO | Attending: Internal Medicine | Admitting: Dietician

## 2020-04-18 ENCOUNTER — Encounter: Payer: Self-pay | Admitting: Dietician

## 2020-04-18 DIAGNOSIS — E1165 Type 2 diabetes mellitus with hyperglycemia: Secondary | ICD-10-CM

## 2020-04-18 NOTE — Patient Instructions (Addendum)
Great job with all of the changes that you have made!  Continue to stay active.  Aim for 30 minutes most days.  Stand up every 30 minutes.  Continue to read labels.  Consider meal planning.  http://schroeder.biz/ would be one site to look for diabetic recipes.

## 2020-04-18 NOTE — Progress Notes (Signed)
Diabetes Self-Management Education  Visit Type: Follow-up  Appt. Start Time: 0955 Appt. End Time: 1030  04/18/2020  [name], identified by name and date of birth, is a 50 y.o. female with a diagnosis of Diabetes:  .   ASSESSMENT Patient is here today for follow up. She has lost 7 lbs since her last visit 1 month ago. She avoids fast food, prepares her meals at home and is mindful about her meal choices. She has had an eye appointment and states that her eyes are healthy. She is monitoring her glucose on her Dexcom which help her be aware of choices that she is making. She has been more active and  Patient is here today alone.  She would like to learn more about diabetes and how to control her blood sugar.  She states as her blood sugar is improving, she is has more energy and her sight has improved.  History includes newly diagnosed diabetes, history of PE. A1C 11% 01/31/2020 decreased from 11% 11/14/2019 Medications include Lantus 16 units q HS, Farxiga.  Lantus is to change to insulin glargine yfgn in April due to Lantus cost.  She was unable to tolerate Metformin or Glipizide. She uses a Dexcom as she was unable to prick her finger. Fasting sensor reading 129-138  Weoght hx: 214 lbs 04/18/2020 221 lbs 03/17/2020 226 lbs 01/31/2020 224 lbs 10/2019 240 lbs 02/2019  Highest adult weight. She states that she started to gain increased weight by drinking more sugar drinks and snacking on candy after she started to work from home in 2020. 225 lbs prior to 2020 181 lowest adult weight  Patient lives alone.  She works from home for [employer]. She recently purchased a stationary bike that she can fit under her desk. She is now cooking at home most of the times.  Weight 214 lb (97.1 kg). Body mass index is 34.54 kg/m.   Diabetes Self-Management Education - 04/18/20 1405      Visit Information   Visit Type Follow-up      Initial Visit   Are you taking your medications  as prescribed? Yes      Psychosocial Assessment   Other persons present Patient      Pre-Education Assessment   Patient understands the diabetes disease and treatment process. Needs Review    Patient understands incorporating nutritional management into lifestyle. Needs Review    Patient undertands incorporating physical activity into lifestyle. Needs Review    Patient understands using medications safely. Needs Review    Patient understands monitoring blood glucose, interpreting and using results Needs Review    Patient understands prevention, detection, and treatment of acute complications. Needs Review    Patient understands prevention, detection, and treatment of chronic complications. Needs Review    Patient understands how to develop strategies to address psychosocial issues. Needs Review    Patient understands how to develop strategies to promote health/change behavior. Needs Review      Complications   How often do you check your blood sugar? > 4 times/day    Fasting Blood glucose range (mg/dL) 70-129;130-179   129-139   Number of hypoglycemic episodes per month 0    Number of hyperglycemic episodes per week 0    Have you had a dilated eye exam in the past 12 months? Yes      Dietary Intake   Breakfast 2 boiled eggs, 1 slice [employer] toast and yogurt or apple    Snack (morning) none    Lunch  lean cuisine    Snack (afternoon) none    Dinner salad with grilled chicken, occasional spaghetti    Snack (evening) occasional sugar free ice cream or SF chocolate    Beverage(s) lemon water, occasional sprite zero or Diet Dr. Malachi Bonds      Exercise   Exercise Type Light (walking / raking leaves)    How many days per week to you exercise? 6    How many minutes per day do you exercise? 30    Total minutes per week of exercise 180      Patient Education   Previous Diabetes Education Yes (please comment)   1 month ago with this RD   Nutrition management  Meal options for control of blood  glucose level and chronic complications.;Information on hints to eating out and maintain blood glucose control.;Food label reading, portion sizes and measuring food.;Reviewed blood glucose goals for pre and post meals and how to evaluate the patients' food intake on their blood glucose level.    Physical activity and exercise  Role of exercise on diabetes management, blood pressure control and cardiac health.    Medications Reviewed patients medication for diabetes, action, purpose, timing of dose and side effects.    Monitoring Identified appropriate SMBG and/or A1C goals.      Individualized Goals (developed by patient)   Nutrition Follow meal plan discussed    Physical Activity Exercise 5-7 days per week;30 minutes per day    Medications take my medication as prescribed    Monitoring  test my blood glucose as discussed    Reducing Risk increase portions of healthy fats;examine blood glucose patterns;do foot checks daily    Health Coping discuss diabetes with (comment)   MD, RD, CDCES     Patient Self-Evaluation of Goals - Patient rates self as meeting previously set goals (% of time)   Nutrition >75%    Physical Activity >75%    Medications >75%    Monitoring >75%    Problem Solving >75%    Reducing Risk >75%    Health Coping >75%      Post-Education Assessment   Patient understands the diabetes disease and treatment process. Demonstrates understanding / competency    Patient understands incorporating nutritional management into lifestyle. Needs Review    Patient undertands incorporating physical activity into lifestyle. Demonstrates understanding / competency    Patient understands using medications safely. Demonstrates understanding / competency    Patient understands monitoring blood glucose, interpreting and using results Demonstrates understanding / competency    Patient understands prevention, detection, and treatment of acute complications. Demonstrates understanding /  competency    Patient understands prevention, detection, and treatment of chronic complications. Demonstrates understanding / competency    Patient understands how to develop strategies to address psychosocial issues. Demonstrates understanding / competency    Patient understands how to develop strategies to promote health/change behavior. Needs Review      Outcomes   Expected Outcomes Demonstrated interest in learning. Expect positive outcomes    Future DMSE 6 months    Program Status Not Completed      Subsequent Visit   Since your last visit have you continued or begun to take your medications as prescribed? Yes    Since your last visit have you experienced any weight changes? Loss    Weight Loss (lbs) 7           Individualized Plan for Diabetes Self-Management Training:   Learning Objective:  Patient will have a greater understanding  of diabetes self-management. Patient education plan is to attend individual and/or group sessions per assessed needs and concerns.   Plan:   Patient Instructions  Doristine Dev[employer] job with all of the changes that you have made!  Continue to stay active.  Aim for 30 minutes most days.  Stand up every 30 minutes.  Continue to read labels.  Consider meal planning.  [email or web address].[email or web address] would be one site to look for diabetic recipes.     Expected Outcomes:  Demonstrated interest in learning. Expect positive outcomes  Education material provided:   If problems or questions, patient to contact team via:  Phone  Future DSME appointment: 6 months

## 2020-04-22 DIAGNOSIS — E119 Type 2 diabetes mellitus without complications: Secondary | ICD-10-CM | POA: Diagnosis not present

## 2020-04-30 ENCOUNTER — Ambulatory Visit (INDEPENDENT_AMBULATORY_CARE_PROVIDER_SITE_OTHER): Payer: BC Managed Care – PPO | Admitting: Internal Medicine

## 2020-04-30 ENCOUNTER — Encounter: Payer: Self-pay | Admitting: Internal Medicine

## 2020-04-30 ENCOUNTER — Other Ambulatory Visit: Payer: Self-pay

## 2020-04-30 VITALS — BP 128/82 | HR 62 | Ht 66.0 in | Wt 219.5 lb

## 2020-04-30 DIAGNOSIS — E785 Hyperlipidemia, unspecified: Secondary | ICD-10-CM | POA: Diagnosis not present

## 2020-04-30 DIAGNOSIS — E1165 Type 2 diabetes mellitus with hyperglycemia: Secondary | ICD-10-CM

## 2020-04-30 DIAGNOSIS — E119 Type 2 diabetes mellitus without complications: Secondary | ICD-10-CM | POA: Diagnosis not present

## 2020-04-30 LAB — POCT GLYCOSYLATED HEMOGLOBIN (HGB A1C): Hemoglobin A1C: 6.8 % — AB (ref 4.0–5.6)

## 2020-04-30 LAB — MICROALBUMIN / CREATININE URINE RATIO
Creatinine,U: 46.3 mg/dL
Microalb Creat Ratio: 1.5 mg/g (ref 0.0–30.0)
Microalb, Ur: <0.7 mg/dL (ref 0.0–1.9)

## 2020-04-30 LAB — BASIC METABOLIC PANEL
BUN: 16 mg/dL (ref 6–23)
CO2: 28 mEq/L (ref 19–32)
Calcium: 10 mg/dL (ref 8.4–10.5)
Chloride: 105 mEq/L (ref 96–112)
Creatinine, Ser: 1.02 mg/dL (ref 0.40–1.20)
GFR: 63.99 mL/min (ref >60.00)
Glucose, Bld: 123 mg/dL — ABNORMAL HIGH (ref 70–99)
Potassium: 3.9 mEq/L (ref 3.5–5.1)
Sodium: 140 mEq/L (ref 135–145)

## 2020-04-30 MED ORDER — INSULIN GLARGINE-YFGN 100 UNIT/ML ~~LOC~~ SOLN: 14.0000 [IU] | Freq: Every day | SUBCUTANEOUS | 6 refills | Status: DC

## 2020-04-30 MED ORDER — ATORVASTATIN CALCIUM 10 MG PO TABS: 10.0000 mg | ORAL_TABLET | Freq: Every day | ORAL | 3 refills | Status: DC

## 2020-04-30 MED ORDER — DAPAGLIFLOZIN PROPANEDIOL 10 MG PO TABS: 10.0000 mg | ORAL_TABLET | Freq: Every day | ORAL | 3 refills | Status: DC
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
## 2020-04-30 NOTE — Patient Instructions (Signed)
-   Keep up the good Work ! - Increase  Farxiga to 10 Mg , 1 Tablet daily before Breakfast  - Decrease  Lantus to  12 units daily      HOW TO TREAT LOW BLOOD SUGARS (Blood sugar LESS THAN 70 MG/DL)  Please follow the RULE OF 15 for the treatment of hypoglycemia treatment (when your (blood sugars are less than 70 mg/dL)    STEP 1: Take 15 grams of carbohydrates when your blood sugar is low, which includes:   3-4 GLUCOSE TABS  OR  3-4 OZ OF JUICE OR REGULAR SODA OR  ONE TUBE OF GLUCOSE GEL     STEP 2: RECHECK blood sugar in 15 MINUTES STEP 3: If your blood sugar is still low at the 15 minute recheck --> then, go back to STEP 1 and treat AGAIN with another 15 grams of carbohydrates.

## 2020-05-23 DIAGNOSIS — E119 Type 2 diabetes mellitus without complications: Secondary | ICD-10-CM | POA: Diagnosis not present

## 2020-06-05 ENCOUNTER — Telehealth: Payer: Self-pay | Admitting: *Deleted

## 2020-06-05 DIAGNOSIS — N950 Postmenopausal bleeding: Secondary | ICD-10-CM

## 2020-06-05 NOTE — Telephone Encounter (Signed)
Patient called stating last week she noticed breast tenderness, yesterday she went to bathroom and noticed pink discharge when wiping only. Today the discharge is more so dark red with a light flow, last [place] checked in 10/2019. LMP:04/2019, patient said she is very confused she thought she was in menopause. Please advise

## 2020-06-05 NOTE — Telephone Encounter (Signed)
Patient informed, ultrasound order placed. Will route to appointments to schedule.

## 2020-06-05 NOTE — Telephone Encounter (Signed)
Her [place] in September was in the postmenopausal range, so I recommend she been seen for an ultrasound for further evaluation of her bleeding.

## 2020-06-05 NOTE — Telephone Encounter (Signed)
Left message for patient to call and schedule appointment.

## 2020-06-22 DIAGNOSIS — E119 Type 2 diabetes mellitus without complications: Secondary | ICD-10-CM | POA: Diagnosis not present

## 2020-07-10 ENCOUNTER — Other Ambulatory Visit: Payer: BC Managed Care – PPO

## 2020-07-10 ENCOUNTER — Other Ambulatory Visit: Payer: BC Managed Care – PPO | Admitting: Obstetrics & Gynecology

## 2020-07-11 ENCOUNTER — Other Ambulatory Visit: Payer: Self-pay

## 2020-07-11 ENCOUNTER — Inpatient Hospital Stay: Payer: BC Managed Care – PPO | Attending: Oncology

## 2020-07-11 ENCOUNTER — Inpatient Hospital Stay (HOSPITAL_BASED_OUTPATIENT_CLINIC_OR_DEPARTMENT_OTHER): Payer: BC Managed Care – PPO | Admitting: Oncology

## 2020-07-11 VITALS — BP 127/81 | HR 79 | Temp 97.4°F | Resp 19 | Wt 218.4 lb

## 2020-07-11 DIAGNOSIS — D509 Iron deficiency anemia, unspecified: Secondary | ICD-10-CM | POA: Diagnosis not present

## 2020-07-11 DIAGNOSIS — Z79899 Other long term (current) drug therapy: Secondary | ICD-10-CM | POA: Diagnosis not present

## 2020-07-11 DIAGNOSIS — Z86711 Personal history of pulmonary embolism: Secondary | ICD-10-CM | POA: Diagnosis not present

## 2020-07-11 DIAGNOSIS — Z885 Allergy status to narcotic agent status: Secondary | ICD-10-CM | POA: Insufficient documentation

## 2020-07-11 DIAGNOSIS — I2699 Other pulmonary embolism without acute cor pulmonale: Secondary | ICD-10-CM | POA: Insufficient documentation

## 2020-07-11 DIAGNOSIS — D500 Iron deficiency anemia secondary to blood loss (chronic): Secondary | ICD-10-CM

## 2020-07-11 DIAGNOSIS — Z7901 Long term (current) use of anticoagulants: Secondary | ICD-10-CM | POA: Insufficient documentation

## 2020-07-11 DIAGNOSIS — D72819 Decreased white blood cell count, unspecified: Secondary | ICD-10-CM | POA: Insufficient documentation

## 2020-07-11 LAB — CBC WITH DIFFERENTIAL (CANCER CENTER ONLY)
Abs Immature Granulocytes: 0 10*3/uL (ref 0.00–0.07)
Basophils Absolute: 0 10*3/uL (ref 0.0–0.1)
Basophils Relative: 1 %
Eosinophils Absolute: 0.1 10*3/uL (ref 0.0–0.5)
Eosinophils Relative: 3 %
HCT: 40.9 % (ref 36.0–46.0)
Hemoglobin: 12.8 g/dL (ref 12.0–15.0)
Immature Granulocytes: 0 %
Lymphocytes Relative: 39 %
Lymphs Abs: 1.5 10*3/uL (ref 0.7–4.0)
MCH: 27.4 pg (ref 26.0–34.0)
MCHC: 31.3 g/dL (ref 30.0–36.0)
MCV: 87.6 fL (ref 80.0–100.0)
Monocytes Absolute: 0.3 10*3/uL (ref 0.1–1.0)
Monocytes Relative: 9 %
Neutro Abs: 1.8 10*3/uL (ref 1.7–7.7)
Neutrophils Relative %: 48 %
Platelet Count: 230 10*3/uL (ref 150–400)
RBC: 4.67 MIL/uL (ref 3.87–5.11)
RDW: 13 % (ref 11.5–15.5)
WBC Count: 3.8 10*3/uL — ABNORMAL LOW (ref 4.0–10.5)
nRBC: 0 % (ref 0.0–0.2)

## 2020-07-11 LAB — IRON AND TIBC
Iron: 69 ug/dL (ref 41–142)
Saturation Ratios: 24 % (ref 21–57)
TIBC: 291 ug/dL (ref 236–444)
UIBC: 223 ug/dL (ref 120–384)

## 2020-07-11 LAB — FERRITIN: Ferritin: 111 ng/mL (ref 11–307)

## 2020-07-11 NOTE — Progress Notes (Signed)
Hematology and Oncology Follow Up Visit  Marissa [name] [id number] 02-25-1970 50 y.o. 07/11/2020 9:54 AM Marissa [name], MDBanks, Langley Adie, MD   Principle Diagnosis: 50 year old woman with recurrent venous thromboembolism  diagnosed in 2015 with relapse in 2018.  She was found to have pulmonary embolism in 2018.  Secondary diagnosis: Iron deficiency anemia related to chronic of blood losses diagnosed in December 2019.     Prior Therapy: Status post IV iron infusion in January 2020 and repeated in June 2020.  Current therapy: Xarelto 20 mg daily continued indefinitely.  Interim History: Marissa [name] returns today for a follow-up visit.  Since her last visit, she reports no major changes in her health.  She continues to tolerate Xarelto without any major complications.  She denies any hematochezia, melena or hemoptysis.  She denies any hospitalization or illnesses.  She reports very little to no menstrual cycles at this time.  She had not had a cycle for a year although she did have 1 episode of bleeding that has resolved.     Medications: Updated on review. Current Outpatient Medications  Medication Sig Dispense Refill  . Accu-Chek Softclix Lancets lancets 1 each by Other route 4 (four) times daily. (Patient not taking: No sig reported) 100 each 12  . atorvastatin (LIPITOR) 10 MG tablet Take 1 tablet (10 mg total) by mouth daily. 90 tablet 3  . blood glucose meter kit and supplies KIT Dispense based on patient and insurance preference. Check fasting each morning (FOR ICD-9 250.00, 250.01). (Patient not taking: No sig reported) 1 each 0  . cholecalciferol (VITAMIN D3) 25 MCG (1000 UT) tablet Take 1,000 Units by mouth daily. Takes 400 units per day    . Continuous Blood Gluc Receiver (DEXCOM G6 RECEIVER) DEVI 1 Device by Does not apply route as directed. 1 each 0  . Continuous Blood Gluc Sensor (DEXCOM G6 SENSOR) MISC 1 Device by Does not apply route as directed. 3 each 11  .  Continuous Blood Gluc Transmit (DEXCOM G6 TRANSMITTER) MISC 1 Device by Does not apply route as directed. 1 each 3  . dapagliflozin propanediol (FARXIGA) 10 MG TABS tablet Take 1 tablet (10 mg total) by mouth daily. 90 tablet 3  . glucose blood (ACCU-CHEK GUIDE) test strip Use as instructed 100 each 12  . Insulin Glargine-yfgn (SEMGLEE, YFGN,) 100 UNIT/ML SOLN Inject 14 Units into the skin daily. 15 mL 6  . Insulin Pen Needle 32G X 4 MM MISC 1 Device by Does not apply route daily. 50 each 6  . rivaroxaban (XARELTO) 20 MG TABS tablet Take 1 tablet (20 mg total) by mouth daily with supper. 360 tablet 0   No current facility-administered medications for this visit.     Allergies:  Allergies  Allergen Reactions  . Nuvaring [Etonogestrel-Ethinyl Estradiol]     Caused pulmonary embolus in 2015  . Codeine Nausea Only        Physical Exam: Blood pressure 127/81, pulse 79, temperature (!) 97.4 F (36.3 C), temperature source Tympanic, resp. rate 19, weight 218 lb 6.4 oz (99.1 kg), SpO2 99 %.    ECOG: 0    General appearance: Comfortable appearing without any discomfort Head: Normocephalic without any trauma Oropharynx: Mucous membranes are moist and pink without any thrush or ulcers. Eyes: Pupils are equal and round reactive to light. Lymph nodes: No cervical, supraclavicular, inguinal or axillary lymphadenopathy.   Heart:regular rate and rhythm.  S1 and S2 without leg edema. Lung: Clear without any rhonchi or  wheezes.  No dullness to percussion. Abdomin: Soft, nontender, nondistended with good bowel sounds.  No hepatosplenomegaly. Musculoskeletal: No joint deformity or effusion.  Full range of motion noted. Neurological: No deficits noted on motor, sensory and deep tendon reflex exam. Skin: No petechial rash or dryness.  Appeared moist.        Lab Results: Lab Results  Component Value Date   WBC 3.2 (L) 11/14/2019   HGB 12.6 11/14/2019   HCT 39.7 11/14/2019   MCV 85.2  11/14/2019   PLT 237 11/14/2019     Chemistry      Component Value Date/Time   NA 140 04/30/2020 1051   NA 139 02/23/2017 1004   K 3.9 04/30/2020 1051   K 3.6 02/23/2017 1004   CL 105 04/30/2020 1051   CO2 28 04/30/2020 1051   CO2 24 02/23/2017 1004   BUN 16 04/30/2020 1051   BUN 8.3 02/23/2017 1004   CREATININE 1.02 04/30/2020 1051   CREATININE 0.92 11/14/2019 1102   CREATININE 1.0 02/23/2017 1004      Component Value Date/Time   CALCIUM 10.0 04/30/2020 1051   CALCIUM 9.1 02/23/2017 1004   ALKPHOS 99 01/12/2019 0916   ALKPHOS 97 02/23/2017 1004   AST 13 11/14/2019 1102   AST 15 01/12/2019 0916   AST 15 02/23/2017 1004   ALT 16 11/14/2019 1102   ALT 20 01/12/2019 0916   ALT 15 02/23/2017 1004   BILITOT 0.4 11/14/2019 1102   BILITOT 0.3 01/12/2019 0916   BILITOT 0.28 02/23/2017 1004          Impression and Plan:   50 year old woman with:    1.  Pulmonary embolism in the setting of recurrent venous thromboembolism diagnosed in 2018.  She is chronically anticoagulated on Xarelto.   Her disease status was updated at this time and risks and benefits of continuing anticoagulation were discussed.  She continues to have no issues related to Xarelto which I recommended continuing indefinitely.  Risk of bleeding was assessed and so far has not had any major issues.  After discussion today, she is agreeable to continue lifetime anticoagulation.   1.  Iron deficiency anemia related to chronic menstrual blood losses and anticoagulation diagnosed in 2019.  She has received intravenous iron infusion in the past and hemoglobin is corrected.  Hemoglobin and September 21 was within normal range with iron studies in May 2021 is also normalized.  Her hemoglobin today is 12.8 and continues to improve at this time.   3.  Leukocytopenia: Continues to be fluctuating and consistent with benign etiology.  White cell count today is close to normal range at 3.8 with absolute neutrophil  count that is normal.  4.  Follow-up: In 1 year for repeat follow-up.   30  minutes were spent on this encounter.  The time was dedicated to reviewing disease status, discussing laboratory data results and outlining future plan of care.    Zola Button, MD 5/20/20229:54 AM

## 2020-07-15 ENCOUNTER — Encounter: Payer: Self-pay | Admitting: Internal Medicine

## 2020-07-21 NOTE — Telephone Encounter (Signed)
PA was submitted for Lantus and Semeglee

## 2020-07-23 DIAGNOSIS — E119 Type 2 diabetes mellitus without complications: Secondary | ICD-10-CM | POA: Diagnosis not present

## 2020-08-22 DIAGNOSIS — E119 Type 2 diabetes mellitus without complications: Secondary | ICD-10-CM | POA: Diagnosis not present

## 2020-08-29 ENCOUNTER — Encounter: Payer: Self-pay | Admitting: Internal Medicine

## 2020-09-11 ENCOUNTER — Encounter: Payer: Self-pay | Admitting: Internal Medicine

## 2020-09-11 ENCOUNTER — Other Ambulatory Visit: Payer: Self-pay

## 2020-09-11 ENCOUNTER — Ambulatory Visit (INDEPENDENT_AMBULATORY_CARE_PROVIDER_SITE_OTHER): Payer: BC Managed Care – PPO | Admitting: Internal Medicine

## 2020-09-11 VITALS — BP 124/82 | HR 88 | Wt 222.0 lb

## 2020-09-11 DIAGNOSIS — E119 Type 2 diabetes mellitus without complications: Secondary | ICD-10-CM

## 2020-09-11 DIAGNOSIS — E785 Hyperlipidemia, unspecified: Secondary | ICD-10-CM | POA: Diagnosis not present

## 2020-09-11 DIAGNOSIS — E1165 Type 2 diabetes mellitus with hyperglycemia: Secondary | ICD-10-CM

## 2020-09-11 LAB — POCT GLYCOSYLATED HEMOGLOBIN (HGB A1C): Hemoglobin A1C: 7 % — AB (ref 4.0–5.6)
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
## 2020-09-11 NOTE — Patient Instructions (Addendum)
-   Continue   Farxiga  10 Mg , 1 Tablet daily before Breakfast  - Decrease  Lantus to  12 units daily     HOW TO TREAT LOW BLOOD SUGARS (Blood sugar LESS THAN 70 MG/DL) Please follow the RULE OF 15 for the treatment of hypoglycemia treatment (when your (blood sugars are less than 70 mg/dL)   STEP 1: Take 15 grams of carbohydrates when your blood sugar is low, which includes:  3-4 GLUCOSE TABS  OR 3-4 OZ OF JUICE OR REGULAR SODA OR ONE TUBE OF GLUCOSE GEL    STEP 2: RECHECK blood sugar in 15 MINUTES STEP 3: If your blood sugar is still low at the 15 minute recheck --> then, go back to STEP 1 and treat AGAIN with another 15 grams of carbohydrates.

## 2020-09-22 DIAGNOSIS — E119 Type 2 diabetes mellitus without complications: Secondary | ICD-10-CM | POA: Diagnosis not present

## 2020-09-30 ENCOUNTER — Telehealth: Payer: Self-pay | Admitting: Gastroenterology

## 2020-09-30 NOTE — Telephone Encounter (Signed)
Patient called states she is not ready to schedule a colonoscopy

## 2020-10-13 ENCOUNTER — Other Ambulatory Visit: Payer: Self-pay | Admitting: Oncology

## 2020-10-14 ENCOUNTER — Encounter: Payer: Self-pay | Admitting: Internal Medicine

## 2020-10-23 DIAGNOSIS — E119 Type 2 diabetes mellitus without complications: Secondary | ICD-10-CM | POA: Diagnosis not present

## 2020-10-31 ENCOUNTER — Ambulatory Visit: Payer: BC Managed Care – PPO | Admitting: Dietician

## 2020-11-05 DIAGNOSIS — Z20822 Contact with and (suspected) exposure to covid-19: Secondary | ICD-10-CM | POA: Diagnosis not present

## 2020-11-07 ENCOUNTER — Telehealth: Payer: Self-pay | Admitting: Family Medicine

## 2020-11-07 NOTE — Telephone Encounter (Signed)
Patient called today to let Dr. Volanda Napoleon know that she tested positive for COVID on September 14th. Patient says she was told to let her PCP know.

## 2020-11-11 ENCOUNTER — Other Ambulatory Visit: Payer: Self-pay | Admitting: Family Medicine

## 2020-11-11 DIAGNOSIS — Z1231 Encounter for screening mammogram for malignant neoplasm of breast: Secondary | ICD-10-CM

## 2020-11-17 ENCOUNTER — Ambulatory Visit: Payer: Self-pay | Admitting: Nurse Practitioner

## 2020-11-19 ENCOUNTER — Other Ambulatory Visit: Payer: Self-pay

## 2020-11-19 ENCOUNTER — Encounter: Payer: Self-pay | Admitting: Nurse Practitioner

## 2020-11-19 ENCOUNTER — Ambulatory Visit (INDEPENDENT_AMBULATORY_CARE_PROVIDER_SITE_OTHER): Payer: BC Managed Care – PPO | Admitting: Nurse Practitioner

## 2020-11-19 VITALS — BP 124/80 | Ht 65.5 in | Wt 223.0 lb

## 2020-11-19 DIAGNOSIS — Z780 Asymptomatic menopausal state: Secondary | ICD-10-CM | POA: Diagnosis not present

## 2020-11-19 DIAGNOSIS — Z01419 Encounter for gynecological examination (general) (routine) without abnormal findings: Secondary | ICD-10-CM

## 2020-11-19 NOTE — Progress Notes (Signed)
   Marissa [name] April 03, 1970 [id number]   History:  50 y.o. G1P0 presents for annual exam without GYN complaints. 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 Postmenopausal - no HRT, denies symptoms. Anemia managed by hematology, iron transfusions in the past, seems to have improved since cycles stopped. On Xarelto for DVT/PE. T2DM managed by endocrinology.   Gynecologic History No LMP recorded. (Menstrual status: Postmenopausal).   Contraception: abstinence and post menopausal status  Health maintenance Last Pap: 11/01/2017. Results were: normal. 5-year repeat Last mammogram: 12/17/2019. Results were: normal  Last colonoscopy: Never Last Dexa: Not indicated  Past medical history, past surgical history, family history and social history were all reviewed and documented in the EPIC chart. Single. Works from home for [employer].   ROS:  A ROS was performed and pertinent positives and negatives are included.  Exam:  Vitals:   11/19/20 0822  BP: 124/80  Weight: 223 lb (101.2 kg)  Height: 5' 5.5" (1.664 m)   Body mass index is 36.54 kg/m.  General appearance:  Normal Thyroid:  Symmetrical, normal in size, without palpable masses or nodularity. Respiratory  Auscultation:  Clear without wheezing or rhonchi Cardiovascular  Auscultation:  Regular rate, without rubs, murmurs or gallops  Edema/varicosities:  Not grossly evident Abdominal  Soft,nontender, without masses, guarding or rebound.  Liver/spleen:  No organomegaly noted  Hernia:  None appreciated  Skin  Inspection:  Grossly normal   Breasts: Examined lying and sitting.   Right: Without masses, retractions, discharge or axillary adenopathy.   Left: Without masses, retractions, discharge or axillary adenopathy. Gentitourinary   Inguinal/mons:  Normal without inguinal adenopathy  External genitalia:  Normal  BUS/Urethra/Skene's glands:  Normal  Vagina:  Normal  Cervix:  Normal  Uterus:  Difficult to palpate due to body habitus  but no gross masses or tenderness  Adnexa/parametria:     Rt: Without masses or tenderness.   Lt: Without masses or tenderness.  Anus and perineum: Normal, non-bleeding external hemorrhoid   Digital rectal exam: Normal sphincter tone without palpated masses or tenderness  Assessment/Plan:  50 y.o. G1P0 for annual exam.   Well female exam with routine gynecological exam - Education provided on SBEs, importance of preventative screenings, current guidelines, high calcium diet, regular exercise, and multivitamin daily.  Labs with endocrinology/hematology.   Postmenopausal - denies menopausal symptoms, no HRT.   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 Will repeat at 5-year interval per guidelines.  Screening for breast cancer - Normal mammogram history.  Continue annual screenings.  Normal breast exam today.  Screening for colon cancer - Has not had screening colonoscopy. Discussed current guidelines and importance of preventative screenings. Information provided on [facility].   Follow up in 1 year for annual     [place], 8:45 AM 11/19/2020

## 2020-11-19 NOTE — Patient Instructions (Signed)
[facility] [id number] [place], [place] [address]

## 2020-11-22 DIAGNOSIS — Z7189 Other specified counseling: Secondary | ICD-10-CM | POA: Diagnosis not present

## 2020-11-22 DIAGNOSIS — E119 Type 2 diabetes mellitus without complications: Secondary | ICD-10-CM | POA: Diagnosis not present

## 2020-11-26 DIAGNOSIS — Z1231 Encounter for screening mammogram for malignant neoplasm of breast: Secondary | ICD-10-CM

## 2020-12-23 DIAGNOSIS — E119 Type 2 diabetes mellitus without complications: Secondary | ICD-10-CM | POA: Diagnosis not present

## 2020-12-23 DIAGNOSIS — Z7189 Other specified counseling: Secondary | ICD-10-CM | POA: Diagnosis not present

## 2020-12-24 ENCOUNTER — Ambulatory Visit
Admission: RE | Admit: 2020-12-24 | Discharge: 2020-12-24 | Disposition: A | Discharge status: Home / Self Care | Payer: BC Managed Care – PPO | Source: Ambulatory Visit | Attending: Family Medicine | Admitting: Family Medicine

## 2020-12-24 ENCOUNTER — Other Ambulatory Visit: Payer: Self-pay

## 2020-12-24 DIAGNOSIS — Z1231 Encounter for screening mammogram for malignant neoplasm of breast: Secondary | ICD-10-CM

## 2021-01-12 ENCOUNTER — Encounter: Payer: Self-pay | Admitting: Internal Medicine

## 2021-01-12 ENCOUNTER — Other Ambulatory Visit: Payer: Self-pay

## 2021-01-12 ENCOUNTER — Ambulatory Visit (INDEPENDENT_AMBULATORY_CARE_PROVIDER_SITE_OTHER): Payer: BC Managed Care – PPO | Admitting: Internal Medicine

## 2021-01-12 VITALS — BP 128/84 | HR 87 | Ht 65.5 in | Wt 225.4 lb

## 2021-01-12 DIAGNOSIS — Z23 Encounter for immunization: Secondary | ICD-10-CM | POA: Diagnosis not present

## 2021-01-12 DIAGNOSIS — E1165 Type 2 diabetes mellitus with hyperglycemia: Secondary | ICD-10-CM

## 2021-01-12 DIAGNOSIS — E785 Hyperlipidemia, unspecified: Secondary | ICD-10-CM

## 2021-01-12 LAB — BASIC METABOLIC PANEL
BUN: 17 mg/dL (ref 6–23)
CO2: 28 mEq/L (ref 19–32)
Calcium: 9.4 mg/dL (ref 8.4–10.5)
Chloride: 106 mEq/L (ref 96–112)
Creatinine, Ser: 1.03 mg/dL (ref 0.40–1.20)
GFR: 62.93 mL/min (ref >60.00)
Glucose, Bld: 118 mg/dL — ABNORMAL HIGH (ref 70–99)
Potassium: 4 mEq/L (ref 3.5–5.1)
Sodium: 140 mEq/L (ref 135–145)

## 2021-01-12 LAB — LIPID PANEL
Cholesterol: 121 mg/dL (ref 0–200)
HDL: 52.3 mg/dL (ref >39.00)
LDL Cholesterol: 54 mg/dL (ref 0–99)
NonHDL: 68.79
Total CHOL/HDL Ratio: 2
Triglycerides: 75 mg/dL (ref 0.0–149.0)
VLDL: 15 mg/dL (ref 0.0–40.0)

## 2021-01-12 LAB — POCT GLYCOSYLATED HEMOGLOBIN (HGB A1C): Hemoglobin A1C: 7.4 % — AB (ref 4.0–5.6)

## 2021-01-12 LAB — MICROALBUMIN / CREATININE URINE RATIO
Creatinine,U: 53.9 mg/dL
Microalb Creat Ratio: 1.3 mg/g (ref 0.0–30.0)
Microalb, Ur: <0.7 mg/dL (ref 0.0–1.9)
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
## 2021-01-12 NOTE — Patient Instructions (Addendum)
-   Continue Farxiga  10 Mg , 1 Tablet daily before Breakfast  - Increase Semglee to 16 units daily     HOW TO TREAT LOW BLOOD SUGARS (Blood sugar LESS THAN 70 MG/DL) Please follow the RULE OF 15 for the treatment of hypoglycemia treatment (when your (blood sugars are less than 70 mg/dL)   STEP 1: Take 15 grams of carbohydrates when your blood sugar is low, which includes:  3-4 GLUCOSE TABS  OR 3-4 OZ OF JUICE OR REGULAR SODA OR ONE TUBE OF GLUCOSE GEL    STEP 2: RECHECK blood sugar in 15 MINUTES STEP 3: If your blood sugar is still low at the 15 minute recheck --> then, go back to STEP 1 and treat AGAIN with another 15 grams of carbohydrates.

## 2021-01-13 ENCOUNTER — Encounter: Payer: Self-pay | Admitting: Internal Medicine

## 2021-01-13 MED ORDER — ATORVASTATIN CALCIUM 10 MG PO TABS: 10.0000 mg | ORAL_TABLET | Freq: Every day | ORAL | 3 refills | Status: DC

## 2021-01-21 ENCOUNTER — Other Ambulatory Visit: Payer: Self-pay | Admitting: Internal Medicine

## 2021-01-22 DIAGNOSIS — E119 Type 2 diabetes mellitus without complications: Secondary | ICD-10-CM | POA: Diagnosis not present

## 2021-01-22 DIAGNOSIS — Z7189 Other specified counseling: Secondary | ICD-10-CM | POA: Diagnosis not present

## 2021-02-02 ENCOUNTER — Other Ambulatory Visit: Payer: Self-pay | Admitting: Internal Medicine

## 2021-02-02 DIAGNOSIS — E1165 Type 2 diabetes mellitus with hyperglycemia: Secondary | ICD-10-CM

## 2021-02-22 DIAGNOSIS — E119 Type 2 diabetes mellitus without complications: Secondary | ICD-10-CM | POA: Diagnosis not present

## 2021-03-25 DIAGNOSIS — E119 Type 2 diabetes mellitus without complications: Secondary | ICD-10-CM | POA: Diagnosis not present

## 2021-04-06 ENCOUNTER — Other Ambulatory Visit: Payer: Self-pay | Admitting: Oncology

## 2021-04-07 ENCOUNTER — Encounter: Payer: Self-pay | Admitting: Internal Medicine

## 2021-04-20 ENCOUNTER — Other Ambulatory Visit: Payer: Self-pay | Admitting: Internal Medicine

## 2021-04-22 DIAGNOSIS — E119 Type 2 diabetes mellitus without complications: Secondary | ICD-10-CM | POA: Diagnosis not present

## 2021-05-13 ENCOUNTER — Ambulatory Visit (INDEPENDENT_AMBULATORY_CARE_PROVIDER_SITE_OTHER): Payer: BC Managed Care – PPO | Admitting: Internal Medicine

## 2021-05-13 ENCOUNTER — Other Ambulatory Visit: Payer: Self-pay

## 2021-05-13 ENCOUNTER — Encounter: Payer: Self-pay | Admitting: Internal Medicine

## 2021-05-13 VITALS — BP 122/80 | HR 78 | Ht 65.5 in | Wt 224.6 lb

## 2021-05-13 DIAGNOSIS — E119 Type 2 diabetes mellitus without complications: Secondary | ICD-10-CM

## 2021-05-13 LAB — POCT GLYCOSYLATED HEMOGLOBIN (HGB A1C): Hemoglobin A1C: 6.9 % — AB (ref 4.0–5.6)

## 2021-05-13 NOTE — Patient Instructions (Addendum)
?-   Continue Farxiga  10 Mg , 1 Tablet daily before Breakfast  ?- Continue Semglee to 16 units daily  ? ? ? ?HOW TO TREAT LOW BLOOD SUGARS (Blood sugar LESS THAN 70 MG/DL) ?Please follow the RULE OF 15 for the treatment of hypoglycemia treatment (when your (blood sugars are less than 70 mg/dL)  ? ?STEP 1: Take 15 grams of carbohydrates when your blood sugar is low, which includes:  ?3-4 GLUCOSE TABS  OR ?3-4 OZ OF JUICE OR REGULAR SODA OR ?ONE TUBE OF GLUCOSE GEL   ? ?STEP 2: RECHECK blood sugar in 15 MINUTES ?STEP 3: If your blood sugar is still low at the 15 minute recheck --> then, go back to STEP 1 and treat AGAIN with another 15 grams of carbohydrates. ? ?
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
## 2021-05-14 ENCOUNTER — Encounter: Payer: Self-pay | Admitting: Internal Medicine

## 2021-05-23 DIAGNOSIS — E119 Type 2 diabetes mellitus without complications: Secondary | ICD-10-CM | POA: Diagnosis not present

## 2021-06-22 DIAGNOSIS — E119 Type 2 diabetes mellitus without complications: Secondary | ICD-10-CM | POA: Diagnosis not present

## 2021-06-25 IMAGING — MG DIGITAL SCREENING BILAT W/ TOMO W/ CAD
8 series · 8 of 24 positions shown · non-contrast
Comparison: Previous exam(s).

CLINICAL DATA: Screening.

EXAM:
DIGITAL SCREENING BILATERAL MAMMOGRAM WITH TOMO AND CAD

[R CC synth-2D]
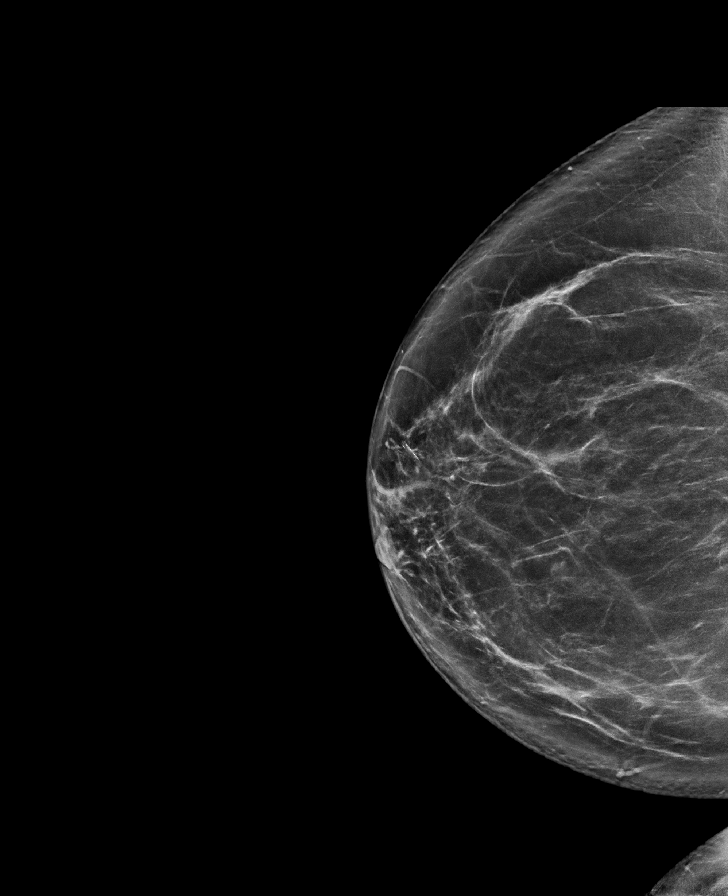

[R MLO synth-2D]
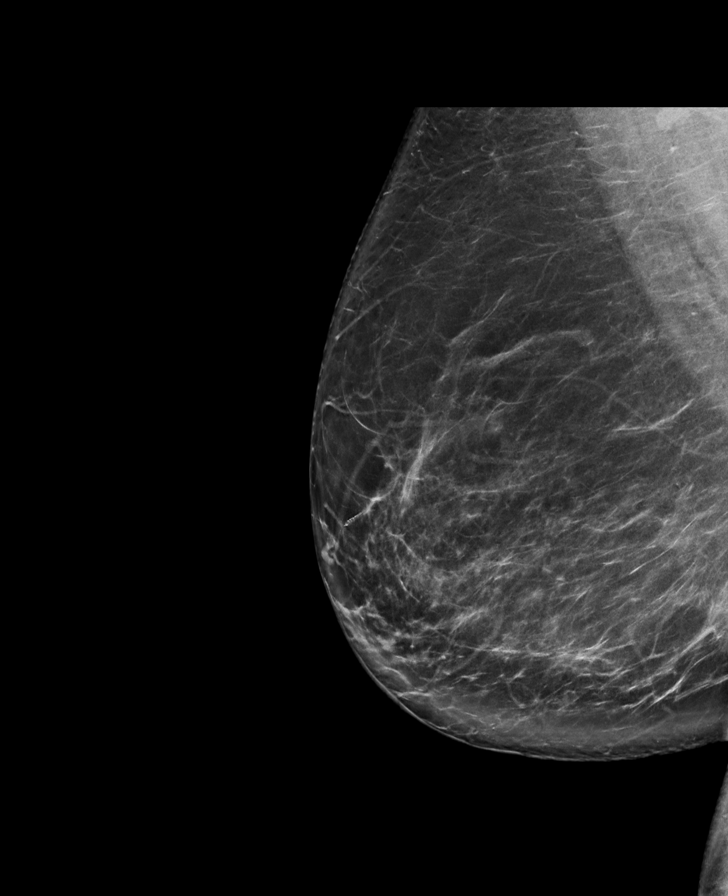

[L CC synth-2D]
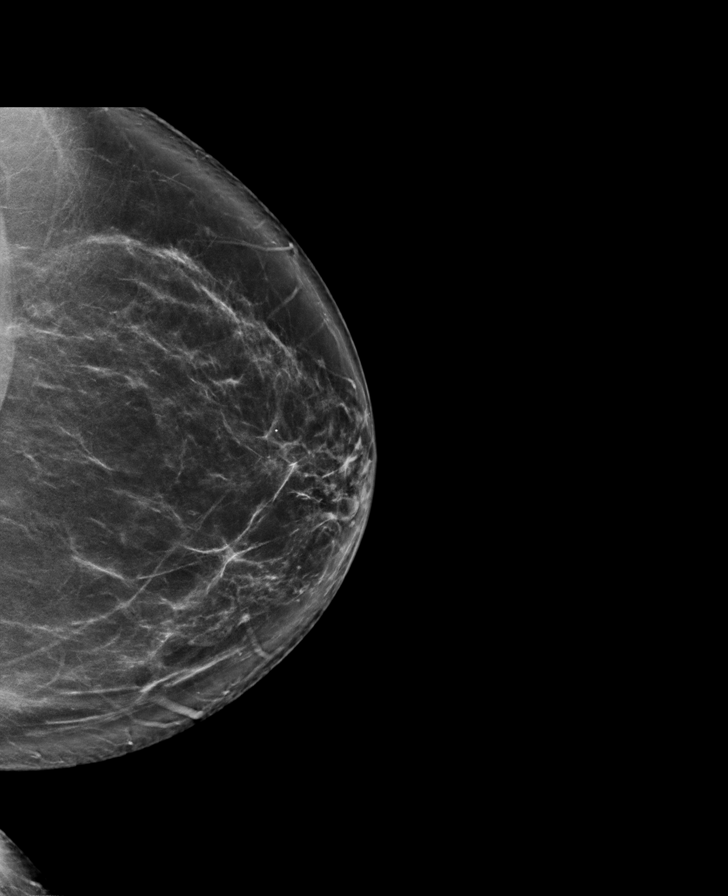

[L MLO synth-2D]
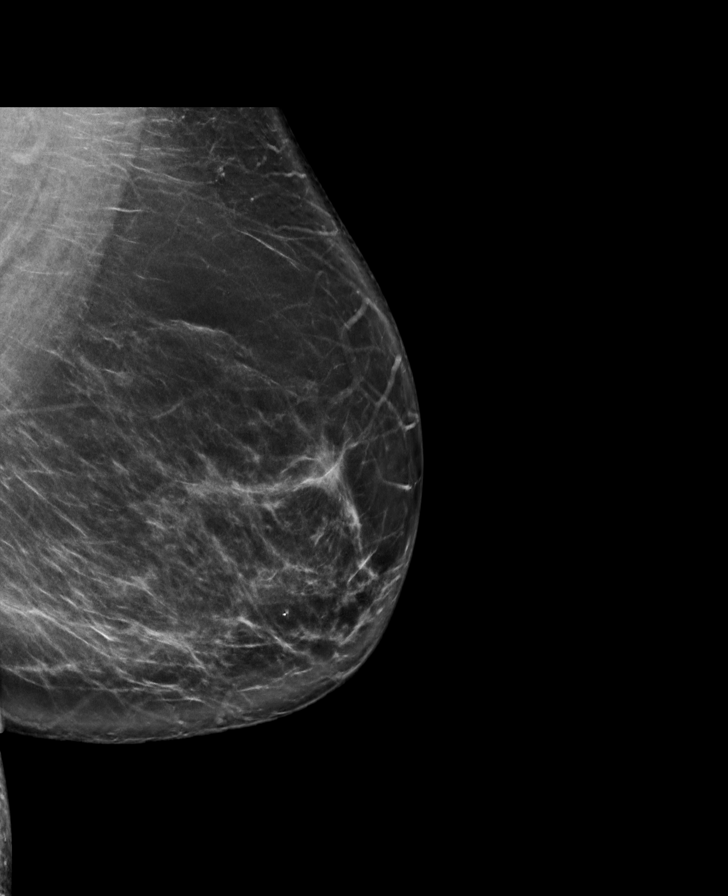

[R MLO tomo · tomo slice 50/99.0]
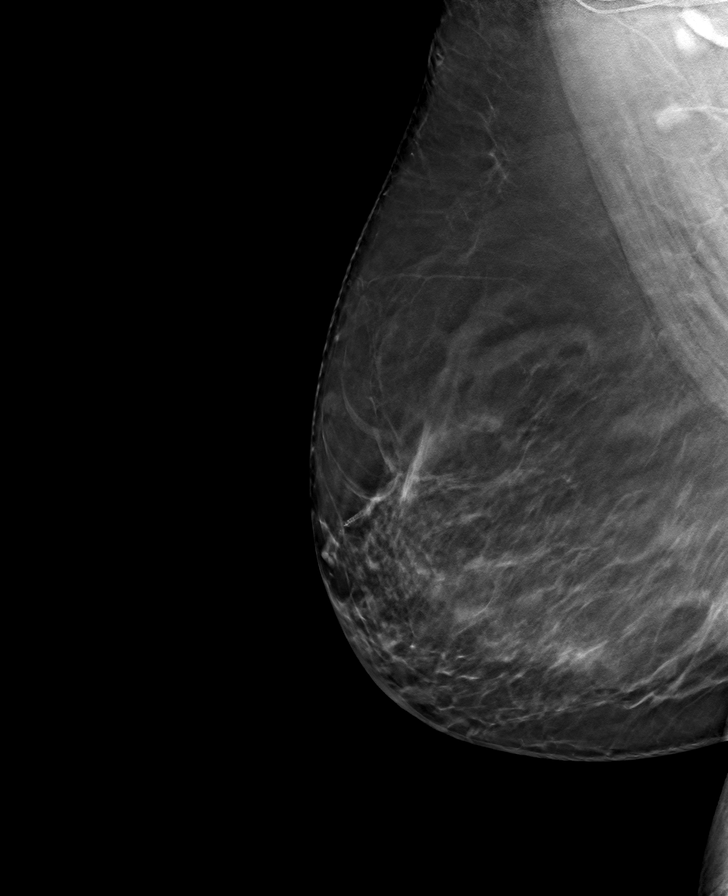

[L MLO tomo · tomo slice 49/97.0]
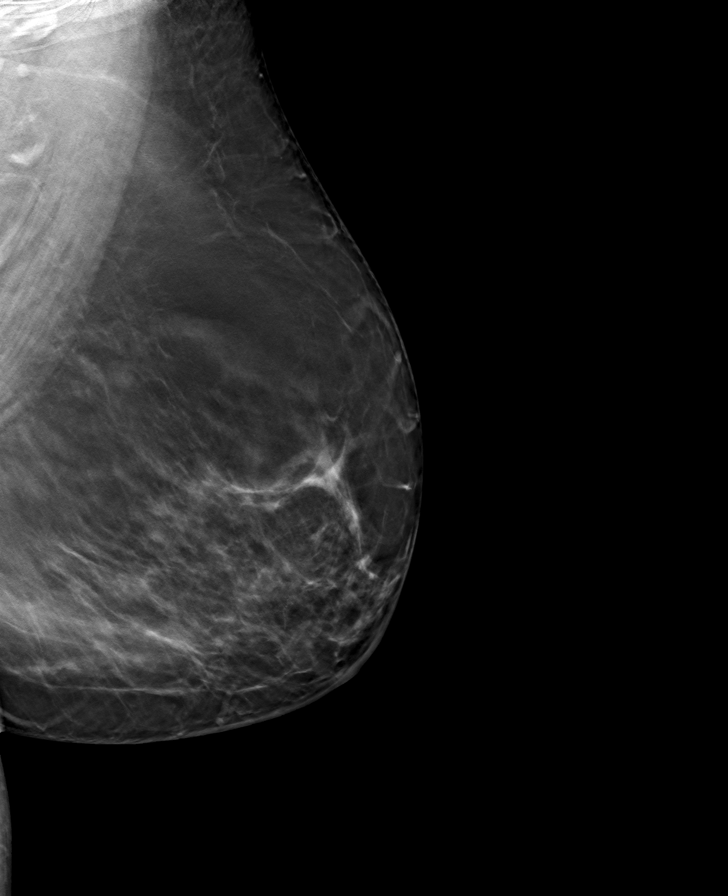

[L CC tomo · tomo slice 49/96.0]
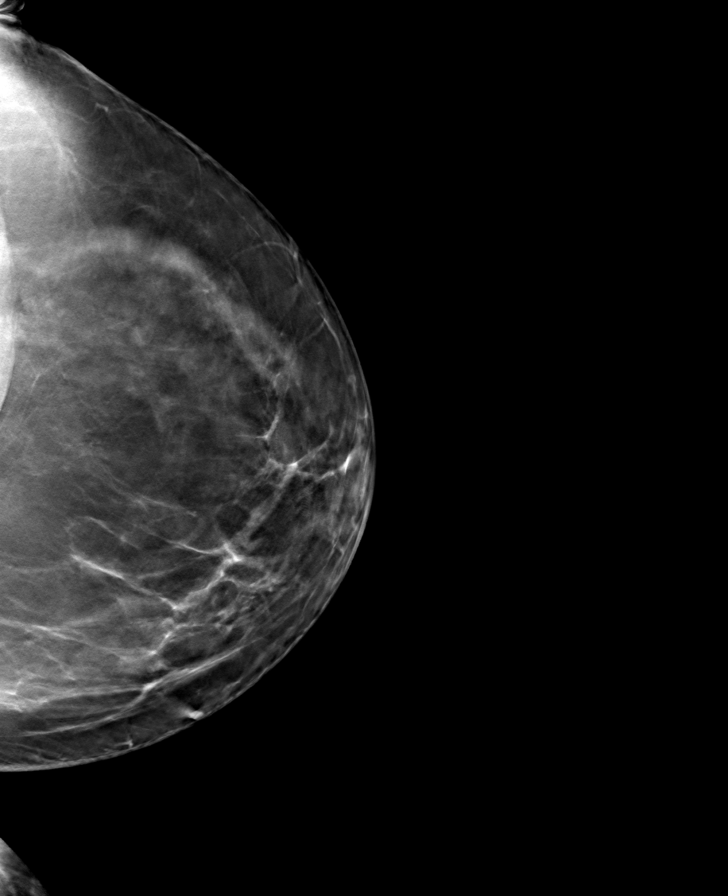

[R CC tomo · tomo slice 47/94.0]
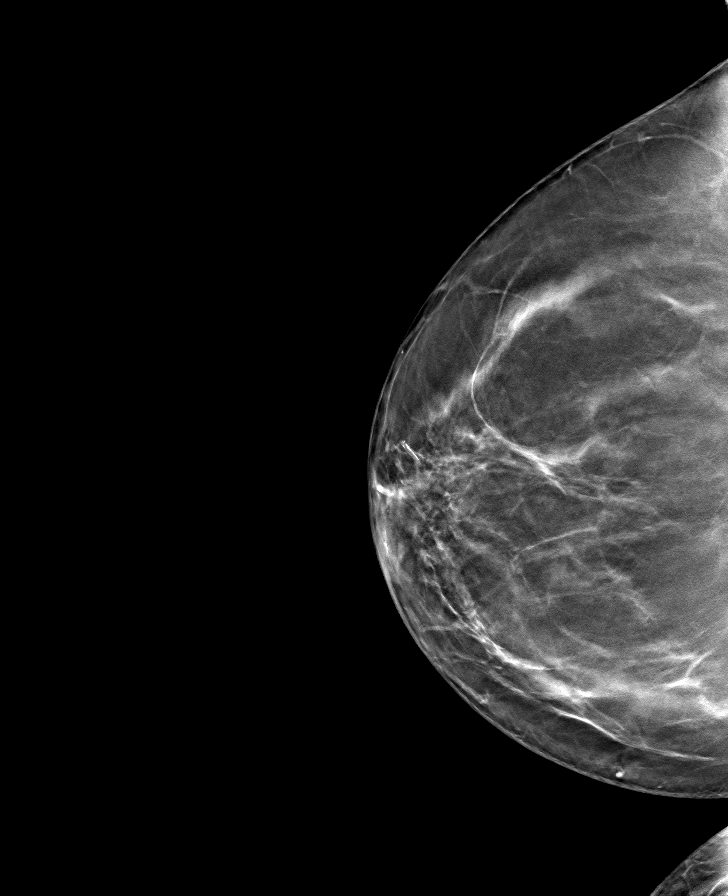

[8 of 24 positions shown; findings below may reference images not displayed]

ACR Breast Density Category b: There are scattered areas of
fibroglandular density.
FINDINGS: There are no findings suspicious for malignancy. Images were
processed with CAD.
IMPRESSION: No mammographic evidence of malignancy. A result letter of this
screening mammogram will be mailed directly to the patient.

RECOMMENDATION:
Screening mammogram in one year. (Code:[id number])

BI-RADS CATEGORY  1: Negative.

## 2021-07-01 ENCOUNTER — Other Ambulatory Visit: Payer: Self-pay | Admitting: Oncology

## 2021-07-01 DIAGNOSIS — I2609 Other pulmonary embolism with acute cor pulmonale: Secondary | ICD-10-CM

## 2021-07-01 MED ORDER — RIVAROXABAN 20 MG PO TABS: 20.0000 mg | ORAL_TABLET | Freq: Every day | ORAL | 0 refills | Status: DC

## 2021-07-08 ENCOUNTER — Other Ambulatory Visit: Payer: Self-pay

## 2021-07-08 DIAGNOSIS — D500 Iron deficiency anemia secondary to blood loss (chronic): Secondary | ICD-10-CM

## 2021-07-09 ENCOUNTER — Telehealth: Payer: Self-pay | Admitting: Oncology

## 2021-07-09 NOTE — Telephone Encounter (Signed)
Called patient regarding upcoming appointment, patient is notified. °

## 2021-07-10 ENCOUNTER — Inpatient Hospital Stay: Payer: BC Managed Care – PPO | Attending: Oncology

## 2021-07-10 ENCOUNTER — Other Ambulatory Visit: Payer: Self-pay

## 2021-07-10 ENCOUNTER — Inpatient Hospital Stay (HOSPITAL_BASED_OUTPATIENT_CLINIC_OR_DEPARTMENT_OTHER): Payer: BC Managed Care – PPO | Admitting: Oncology

## 2021-07-10 VITALS — BP 116/81 | HR 89 | Temp 97.6°F | Resp 18 | Ht 65.5 in | Wt 224.3 lb

## 2021-07-10 DIAGNOSIS — Z7901 Long term (current) use of anticoagulants: Secondary | ICD-10-CM | POA: Diagnosis not present

## 2021-07-10 DIAGNOSIS — D72819 Decreased white blood cell count, unspecified: Secondary | ICD-10-CM | POA: Diagnosis not present

## 2021-07-10 DIAGNOSIS — D500 Iron deficiency anemia secondary to blood loss (chronic): Secondary | ICD-10-CM | POA: Diagnosis not present

## 2021-07-10 DIAGNOSIS — Z86711 Personal history of pulmonary embolism: Secondary | ICD-10-CM | POA: Insufficient documentation

## 2021-07-10 DIAGNOSIS — I2609 Other pulmonary embolism with acute cor pulmonale: Secondary | ICD-10-CM

## 2021-07-10 LAB — CBC WITH DIFFERENTIAL (CANCER CENTER ONLY)
Abs Immature Granulocytes: 0 10*3/uL (ref 0.00–0.07)
Basophils Absolute: 0 10*3/uL (ref 0.0–0.1)
Basophils Relative: 1 %
Eosinophils Absolute: 0.1 10*3/uL (ref 0.0–0.5)
Eosinophils Relative: 2 %
HCT: 41.3 % (ref 36.0–46.0)
Hemoglobin: 12.9 g/dL (ref 12.0–15.0)
Immature Granulocytes: 0 %
Lymphocytes Relative: 38 %
Lymphs Abs: 1.3 10*3/uL (ref 0.7–4.0)
MCH: 27.2 pg (ref 26.0–34.0)
MCHC: 31.2 g/dL (ref 30.0–36.0)
MCV: 87.1 fL (ref 80.0–100.0)
Monocytes Absolute: 0.3 10*3/uL (ref 0.1–1.0)
Monocytes Relative: 9 %
Neutro Abs: 1.7 10*3/uL (ref 1.7–7.7)
Neutrophils Relative %: 50 %
Platelet Count: 244 10*3/uL (ref 150–400)
RBC: 4.74 MIL/uL (ref 3.87–5.11)
RDW: 12.9 % (ref 11.5–15.5)
WBC Count: 3.4 10*3/uL — ABNORMAL LOW (ref 4.0–10.5)
nRBC: 0 % (ref 0.0–0.2)

## 2021-07-10 LAB — CMP (CANCER CENTER ONLY)
ALT: 28 U/L (ref 0–44)
AST: 18 U/L (ref 15–41)
Albumin: 4.2 g/dL (ref 3.5–5.0)
Alkaline Phosphatase: 117 U/L (ref 38–126)
Anion gap: 8 (ref 5–15)
BUN: 16 mg/dL (ref 6–20)
CO2: 26 mmol/L (ref 22–32)
Calcium: 9.7 mg/dL (ref 8.9–10.3)
Chloride: 107 mmol/L (ref 98–111)
Creatinine: 1.01 mg/dL — ABNORMAL HIGH (ref 0.44–1.00)
GFR, Estimated: >60 mL/min (ref >60)
Glucose, Bld: 161 mg/dL — ABNORMAL HIGH (ref 70–99)
Potassium: 3.9 mmol/L (ref 3.5–5.1)
Sodium: 141 mmol/L (ref 135–145)
Total Bilirubin: 0.4 mg/dL (ref 0.3–1.2)
Total Protein: 7.5 g/dL (ref 6.5–8.1)

## 2021-07-10 LAB — IRON AND IRON BINDING CAPACITY (CC-WL,HP ONLY)
Iron: 74 ug/dL (ref 28–170)
Saturation Ratios: 23 % (ref 10.4–31.8)
TIBC: 316 ug/dL (ref 250–450)
UIBC: 242 ug/dL (ref 148–442)

## 2021-07-10 LAB — FERRITIN: Ferritin: 71 ng/mL (ref 11–307)

## 2021-07-10 NOTE — Progress Notes (Signed)
Hematology and Oncology Follow Up Visit  Marissa [name] [id number] [birth year] 51 y.o. 07/10/2021 10:14 AM Marissa [name], MDBanks, Langley Adie, MD   Principle Diagnosis: 51 year old woman with pulmonary embolism diagnosed in 2015 and recurrence in 2018.    Secondary diagnosis: Iron deficiency anemia diagnosed in 2019 related to chronic menstrual blood losses.      Prior Therapy: Status post IV iron infusion in January 2020 and repeated in June 2020.  Current therapy: Xarelto 20 mg daily.  This will be continued indefinitely.  Interim History: Marissa [name] is here for repeat evaluation.  Since the last visit, she reports no major complaints.  She continues to tolerate Xarelto without any concerns.  She denies any hematochezia, melena or hemoptysis.  She denies any hospitalizations or illnesses.    Medications: Reviewed without changes. Current Outpatient Medications  Medication Sig Dispense Refill   Accu-Chek Softclix Lancets lancets 1 each by Other route 4 (four) times daily. 100 each 12   atorvastatin (LIPITOR) 10 MG tablet Take 1 tablet by mouth once daily 90 tablet 0   blood glucose meter kit and supplies KIT Dispense based on patient and insurance preference. Check fasting each morning (FOR ICD-9 250.00, 250.01). 1 each 0   cholecalciferol (VITAMIN D3) 25 MCG (1000 UT) tablet Take 1,000 Units by mouth daily. Takes 400 units per day     Continuous Blood Gluc Receiver (DEXCOM G6 RECEIVER) DEVI 1 Device by Does not apply route as directed. 1 each 0   Continuous Blood Gluc Sensor (DEXCOM G6 SENSOR) MISC AS DIRECTED 3 each 0   Continuous Blood Gluc Transmit (DEXCOM G6 TRANSMITTER) MISC USE AS DIRECTED 1 each 0   FARXIGA 10 MG TABS tablet Take 1 tablet by mouth once daily 90 tablet 0   glucose blood (ACCU-CHEK GUIDE) test strip Use as instructed 100 each 12   Insulin Glargine-yfgn (SEMGLEE, YFGN,) 100 UNIT/ML SOLN Inject 14 Units into the skin daily. 15 mL 6   RELION PEN NEEDLES  32G X 4 MM MISC USE 1  ONCE DAILY 50 each 0   rivaroxaban (XARELTO) 20 MG TABS tablet Take 1 tablet (20 mg total) by mouth daily with supper. 90 tablet 0   No current facility-administered medications for this visit.     Allergies:  Allergies  Allergen Reactions   Nuvaring [Etonogestrel-Ethinyl Estradiol]     Caused pulmonary embolus in 2015   Codeine Nausea Only        Physical Exam:   Blood pressure 116/81, pulse 89, temperature 97.6 F (36.4 C), resp. rate 18, height 5' 5.5" (1.664 m), weight 224 lb 4.8 oz (101.7 kg), SpO2 95 %.   ECOG: 0    General appearance: Alert, awake without any distress. Head: Atraumatic without abnormalities Oropharynx: Without any thrush or ulcers. Eyes: No scleral icterus. Lymph nodes: No lymphadenopathy noted in the cervical, supraclavicular, or axillary nodes Heart:regular rate and rhythm, without any murmurs or gallops.   Lung: Clear to auscultation without any rhonchi, wheezes or dullness to percussion. Abdomin: Soft, nontender without any shifting dullness or ascites. Musculoskeletal: No clubbing or cyanosis. Neurological: No motor or sensory deficits. Skin: No rashes or lesions.       Lab Results: Lab Results  Component Value Date   WBC 3.4 (L) 07/10/2021   HGB 12.9 07/10/2021   HCT 41.3 07/10/2021   MCV 87.1 07/10/2021   PLT 244 07/10/2021     Chemistry      Component Value Date/Time   NA 140  01/12/2021 1104   NA 139 02/23/2017 1004   K 4.0 01/12/2021 1104   K 3.6 02/23/2017 1004   CL 106 01/12/2021 1104   CO2 28 01/12/2021 1104   CO2 24 02/23/2017 1004   BUN 17 01/12/2021 1104   BUN 8.3 02/23/2017 1004   CREATININE 1.03 01/12/2021 1104   CREATININE 0.92 11/14/2019 1102   CREATININE 1.0 02/23/2017 1004      Component Value Date/Time   CALCIUM 9.4 01/12/2021 1104   CALCIUM 9.1 02/23/2017 1004   ALKPHOS 99 01/12/2019 0916   ALKPHOS 97 02/23/2017 1004   AST 13 11/14/2019 1102   AST 15 01/12/2019 0916    AST 15 02/23/2017 1004   ALT 16 11/14/2019 1102   ALT 20 01/12/2019 0916   ALT 15 02/23/2017 1004   BILITOT 0.4 11/14/2019 1102   BILITOT 0.3 01/12/2019 0916   BILITOT 0.28 02/23/2017 1004          Impression and Plan:   51 year old woman with:    1.  Recurrent venous thromboembolism diagnosed in 2018.     Continues to be on Xarelto without any major complications.  Risks and benefits of continuing this treatment long-term were reviewed.  Potential issues that include bleeding complications were reiterated.  We also discussed the the need for therapy interruption if she has to have a procedure in the future.  At this time the risk of thrombosis exceeds the risk of bleeding and I recommended continuing it.  He is in agreement.   1.  Iron deficiency anemia diagnosed in 2019 related to chronic menstrual blood losses and anticoagulation.  CBC from today showed a normal hemoglobin at this time.  She is no longer having any menstrual bleeding.     3.  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  White cell count is 3.4 with a normal differential.  No need for intervention.  4.  Age-appropriate cancer screening: She is up-to-date on her mammography.  I urged her to schedule her colonoscopy in the near future.   5.  Follow-up: She will return in 12 months for a follow-up visit.  30  minutes were dedicated to this visit.  The time was spent on chart data, disease status update and outlining future plan of care discussion.    Zola Button, MD 5/19/202310:14 AM

## 2021-07-17 ENCOUNTER — Other Ambulatory Visit: Payer: Self-pay | Admitting: Internal Medicine

## 2021-07-19 ENCOUNTER — Other Ambulatory Visit: Payer: Self-pay | Admitting: Internal Medicine

## 2021-07-21 MED ORDER — ATORVASTATIN CALCIUM 10 MG PO TABS: 10.0000 mg | ORAL_TABLET | Freq: Every day | ORAL | 0 refills | Status: DC

## 2021-07-23 DIAGNOSIS — E119 Type 2 diabetes mellitus without complications: Secondary | ICD-10-CM | POA: Diagnosis not present

## 2021-07-29 ENCOUNTER — Other Ambulatory Visit: Payer: Self-pay | Admitting: Internal Medicine

## 2021-07-31 ENCOUNTER — Telehealth: Payer: Self-pay | Admitting: Oncology

## 2021-07-31 NOTE — Telephone Encounter (Signed)
.  Called patient to schedule appointment per 5/26  los, patient is aware of date and time.

## 2021-08-04 ENCOUNTER — Encounter: Payer: Self-pay | Admitting: Gastroenterology

## 2021-08-04 ENCOUNTER — Telehealth: Payer: Self-pay | Admitting: *Deleted

## 2021-08-04 ENCOUNTER — Ambulatory Visit (INDEPENDENT_AMBULATORY_CARE_PROVIDER_SITE_OTHER): Payer: BC Managed Care – PPO | Admitting: Gastroenterology

## 2021-08-04 VITALS — BP 140/110 | HR 76 | Ht 65.0 in | Wt 227.0 lb

## 2021-08-04 DIAGNOSIS — D509 Iron deficiency anemia, unspecified: Secondary | ICD-10-CM

## 2021-08-04 DIAGNOSIS — Z1211 Encounter for screening for malignant neoplasm of colon: Secondary | ICD-10-CM

## 2021-08-04 NOTE — Telephone Encounter (Signed)
Faxed letter to Dr. Alen Blew and sent letter via [place].

## 2021-08-04 NOTE — Telephone Encounter (Signed)
-----   Message from Wyatt Portela, MD sent at 08/04/2021 11:37 AM EDT ----- Her anticoagulation can be withheld for 2 days prior to procedure. ----- Message ----- From: Horris Latino, CMA Sent: 08/04/2021  10:24 AM EDT To: Wyatt Portela, MD
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
## 2021-08-04 NOTE — Patient Instructions (Signed)
It was my pleasure to provide care to you today. Based on our discussion, I am providing you with my recommendations below:  RECOMMENDATION(S):   COLONOSCOPY/UPPER ENDOSCOPY:   You have been scheduled for a colonoscopy/upper endoscopy. Please follow written instructions given to you at your visit today.   PREP:   Please pick up your prep supplies at the pharmacy within the next 1-3 days.  INHALERS:   If you use inhalers (even only as needed), please bring them with you on the day of your procedure.  MEDICATIONS TO HOLD:  We will contact your provider to request permission for you to hold Xarelto. Once we receive a response, you will be contacted by our office. If you do not hear from our office 1 week prior to your scheduled procedure, please contact our office at [phone].   COLONOSCOPY TIPS:  To reduce nausea and dehydration, stay well hydrated for 3-4 days prior to the exam.  To prevent skin/hemorrhoid irritation - prior to wiping, put A&Dointment or vaseline on the toilet paper. Keep a towel or pad on the bed.  BEFORE STARTING YOUR PREP, drink  64oz of clear liquids in the morning. This will help to flush the colon and will ensure you are well hydrated!!!!  NOTE - This is in addition to the fluids required for to complete your prep. Use of a flavored hard candy, such as grape Anise Salvo, can counteract some of the flavor of the prep and may prevent some nausea.    FOLLOW UP:  After your procedure, you will receive a call from my office staff regarding my recommendation for follow up.  BMI:  If you are age 42 or older, your body mass index should be between 23-30. Your Body mass index is 37.77 kg/m. If this is out of the aforementioned range listed, please consider follow up with your Primary Care Provider.  If you are age 34 or younger, your body mass index should be between 19-25. Your Body mass index is 37.77 kg/m. If this is out of the aformentioned range  listed, please consider follow up with your Primary Care Provider.   MY CHART:  The [facility] providers would like to encourage you to use [facility] to communicate with providers for non-urgent requests or questions.  Due to long hold times on the telephone, sending your provider a message by [facility] may be a faster and more efficient way to get a response.  Please allow 48 business hours for a response.  Please remember that this is for non-urgent requests.   Thank you for trusting me with your gastrointestinal care!    Thornton Park, MD, MPH

## 2021-08-05 NOTE — Telephone Encounter (Signed)
Patient informed she is able to hold her Xarelto. Patient voiced understanding.

## 2021-08-11 ENCOUNTER — Other Ambulatory Visit: Payer: Self-pay | Admitting: Internal Medicine

## 2021-08-22 DIAGNOSIS — E119 Type 2 diabetes mellitus without complications: Secondary | ICD-10-CM | POA: Diagnosis not present

## 2021-09-20 ENCOUNTER — Encounter: Payer: Self-pay | Admitting: Certified Registered Nurse Anesthetist

## 2021-09-21 ENCOUNTER — Other Ambulatory Visit: Payer: Self-pay | Admitting: Oncology

## 2021-09-21 DIAGNOSIS — I2609 Other pulmonary embolism with acute cor pulmonale: Secondary | ICD-10-CM

## 2021-09-22 DIAGNOSIS — E119 Type 2 diabetes mellitus without complications: Secondary | ICD-10-CM | POA: Diagnosis not present

## 2021-09-24 ENCOUNTER — Encounter: Payer: Self-pay | Admitting: Gastroenterology

## 2021-09-24 ENCOUNTER — Ambulatory Visit (AMBULATORY_SURGERY_CENTER): Payer: BC Managed Care – PPO | Admitting: Gastroenterology

## 2021-09-24 VITALS — BP 111/77 | HR 81 | Temp 97.3°F | Resp 12 | Ht 65.0 in | Wt 227.0 lb

## 2021-09-24 DIAGNOSIS — K635 Polyp of colon: Secondary | ICD-10-CM

## 2021-09-24 DIAGNOSIS — D509 Iron deficiency anemia, unspecified: Secondary | ICD-10-CM | POA: Diagnosis not present

## 2021-09-24 DIAGNOSIS — K259 Gastric ulcer, unspecified as acute or chronic, without hemorrhage or perforation: Secondary | ICD-10-CM

## 2021-09-24 DIAGNOSIS — K621 Rectal polyp: Secondary | ICD-10-CM | POA: Diagnosis not present

## 2021-09-24 DIAGNOSIS — Z1211 Encounter for screening for malignant neoplasm of colon: Secondary | ICD-10-CM

## 2021-09-24 DIAGNOSIS — K2970 Gastritis, unspecified, without bleeding: Secondary | ICD-10-CM

## 2021-09-24 DIAGNOSIS — D122 Benign neoplasm of ascending colon: Secondary | ICD-10-CM

## 2021-09-24 DIAGNOSIS — D125 Benign neoplasm of sigmoid colon: Secondary | ICD-10-CM

## 2021-09-24 DIAGNOSIS — D124 Benign neoplasm of descending colon: Secondary | ICD-10-CM

## 2021-09-24 DIAGNOSIS — K2960 Other gastritis without bleeding: Secondary | ICD-10-CM | POA: Diagnosis not present

## 2021-09-24 DIAGNOSIS — D123 Benign neoplasm of transverse colon: Secondary | ICD-10-CM

## 2021-09-24 DIAGNOSIS — D128 Benign neoplasm of rectum: Secondary | ICD-10-CM

## 2021-09-24 MED ORDER — PANTOPRAZOLE SODIUM 40 MG PO TBEC: 40.0000 mg | DELAYED_RELEASE_TABLET | Freq: Every morning | ORAL | 1 refills | Status: DC

## 2021-09-24 MED ORDER — SODIUM CHLORIDE 0.9 % IV SOLN: 500.0000 mL | Freq: Once | INTRAVENOUS | Status: DC
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
## 2021-09-24 NOTE — Op Note (Addendum)
[facility] Patient Name: Marissa [name] Procedure Date: 09/24/2021 1:11 PM MRN: [id number] Endoscopist: Thornton Park MD, MD Age: 51 Referring MD:  Date of Birth: 09/07/1970 Gender: Female Account #: [id number] Procedure:                Colonoscopy Indications:              Screening for colorectal malignant neoplasm, This                            is the patient's first colonoscopy Medicines:                Monitored Anesthesia Care Procedure:                Pre-Anesthesia Assessment:                           - Prior to the procedure, a History and Physical                            was performed, and patient medications and                            allergies were reviewed. The patient's tolerance of                            previous anesthesia was also reviewed. The risks                            and benefits of the procedure and the sedation                            options and risks were discussed with the patient.                            All questions were answered, and informed consent                            was obtained. Prior Anticoagulants: The patient has                            taken Xarelto (rivaroxaban), last dose was 2 days                            prior to procedure. ASA Grade Assessment: II - A                            patient with mild systemic disease. After reviewing                            the risks and benefits, the patient was deemed in                            satisfactory condition to undergo the procedure.  After obtaining informed consent, the colonoscope                            was passed under direct vision. Throughout the                            procedure, the patient's blood pressure, pulse, and                            oxygen saturations were monitored continuously. The                            CF [id number] #[id number] was introduced through the anus                            and  advanced to the 3 cm into the ileum. A second                            forward view of the right colon was performed. The                            colonoscopy was performed without difficulty. The                            patient tolerated the procedure well. The quality                            of the bowel preparation was good. The terminal                            ileum, ileocecal valve, appendiceal orifice, and                            rectum were photographed. Scope In: 1:44:08 PM Scope Out: 2:02:39 PM Scope Withdrawal Time: 0 hours 16 minutes 29 seconds  Total Procedure Duration: 0 hours 18 minutes 31 seconds  Findings:                 Non-bleeding external and internal hemorrhoids were                            found.                           A few small-mouthed diverticula were found in the                            sigmoid colon.                           Two sessile polyps were found in the rectum and                            sigmoid colon. The polyps were 2 to 3 mm in size.  These polyps were removed with a cold snare.                            Resection and retrieval were complete. Estimated                            blood loss was minimal.                           A 2 mm polyp was found in the descending colon. The                            polyp was sessile. The polyp was removed with a                            cold snare. Resection and retrieval were complete.                            Estimated blood loss was minimal.                           A 2 mm polyp was found in the transverse colon. The                            polyp was sessile. The polyp was removed with a                            cold snare. Resection and retrieval were complete.                            Estimated blood loss was minimal.                           A 10 mm polyp was found in the distal ascending                            colon. The polyp  was flat. The polyp was removed                            with a piecemeal technique using a cold snare.                            Resection and retrieval were complete. Estimated                            blood loss was minimal.                           A 4 mm polyp was found in the proximal ascending                            colon. The polyp was sessile. The polyp was removed  with a cold snare. Resection and retrieval were                            complete. Estimated blood loss was minimal.                           The exam was otherwise without abnormality on                            direct and retroflexion views. Complications:            No immediate complications. Estimated Blood Loss:     Estimated blood loss was minimal. Impression:               - Non-bleeding external and internal hemorrhoids.                           - Diverticulosis in the sigmoid colon.                           - Two 2 to 3 mm polyps in the rectum and in the                            sigmoid colon, removed with a cold snare. Resected                            and retrieved.                           - One 2 mm polyp in the descending colon, removed                            with a cold snare. Resected and retrieved.                           - One 2 mm polyp in the transverse colon, removed                            with a cold snare. Resected and retrieved.                           - One 10 mm polyp in the distal ascending colon,                            removed piecemeal using a cold snare. Resected and                            retrieved.                           - One 4 mm polyp in the proximal ascending colon,                            removed with a cold snare. Resected and retrieved.                           -  The examination was otherwise normal on direct                            and retroflexion views. Recommendation:           - Patient has a  contact number available for                            emergencies. The signs and symptoms of potential                            delayed complications were discussed with the                            patient. Return to normal activities tomorrow.                            Written discharge instructions were provided to the                            patient.                           - High fiber diet.                           - Continue present medications.                           - Await pathology results.                           - Repeat colonoscopy date to be determined after                            pending pathology results are reviewed for                            surveillance.                           - Resume Xarelto (rivaroxaban) at prior dose 09/26/21.                           - Emerging evidence supports eating a diet of                            fruits, vegetables, grains, calcium, and yogurt                            while reducing red meat and alcohol may reduce the                            risk of colon cancer.                           - Thank  you for allowing me to be involved in your                            colon cancer prevention. Thornton Park MD, MD 09/24/2021 2:16:39 PM This report has been signed electronically.

## 2021-09-24 NOTE — Progress Notes (Signed)
Report given to PACU, vss 

## 2021-09-24 NOTE — Patient Instructions (Addendum)
Await pathology results.  Resume Xarelto at prior dose on 09/26/21.  No aspirin, ibuprofen, naproxen, or other non-steroidal anti-inflammatory drugs.  YOU HAD AN ENDOSCOPIC PROCEDURE TODAY AT [facility]:   Refer to the procedure report that was given to you for any specific questions about what was found during the examination.  If the procedure report does not answer your questions, please call your gastroenterologist to clarify.  If you requested that your care partner not be given the details of your procedure findings, then the procedure report has been included in a sealed envelope for you to review at your convenience later.  YOU SHOULD EXPECT: Some feelings of bloating in the abdomen. Passage of more gas than usual.  Walking can help get rid of the air that was put into your GI tract during the procedure and reduce the bloating. If you had a lower endoscopy (such as a colonoscopy or flexible sigmoidoscopy) you may notice spotting of blood in your stool or on the toilet paper. If you underwent a bowel prep for your procedure, you may not have a normal bowel movement for a few days.  Please Note:  You might notice some irritation and congestion in your nose or some drainage.  This is from the oxygen used during your procedure.  There is no need for concern and it should clear up in a day or so.  SYMPTOMS TO REPORT IMMEDIATELY:  Following lower endoscopy (colonoscopy or flexible sigmoidoscopy):  Excessive amounts of blood in the stool  Significant tenderness or worsening of abdominal pains  Swelling of the abdomen that is new, acute  Fever of 100F or higher  Following upper endoscopy (EGD)  Vomiting of blood or coffee ground material  New chest pain or pain under the shoulder blades  Painful or persistently difficult swallowing  New shortness of breath  Fever of 100F or higher  Black, tarry-looking stools  For urgent or emergent issues, a gastroenterologist can be  reached at any hour by calling [phone]. Do not use MyChart messaging for urgent concerns.    DIET:  We do recommend a small meal at first, but then you may proceed to your regular diet.  Drink plenty of fluids but you should avoid alcoholic beverages for 24 hours.  ACTIVITY:  You should plan to take it easy for the rest of today and you should NOT DRIVE or use heavy machinery until tomorrow (because of the sedation medicines used during the test).    FOLLOW UP: Our staff will call the number listed on your records the next business day following your procedure.  We will call around 7:15- 8:00 am to check on you and address any questions or concerns that you may have regarding the information given to you following your procedure. If we do not reach you, we will leave a message.  If you develop any symptoms (ie: fever, flu-like symptoms, shortness of breath, cough etc.) before then, please call [phone].  If you test positive for Covid 19 in the 2 weeks post procedure, please call and report this information to [place].    If any biopsies were taken you will be contacted by phone or by letter within the next 1-3 weeks.  Please call us at [phone] if you have not heard about the biopsies in 3 weeks.    SIGNATURES/CONFIDENTIALITY: You and/or your care partner have signed paperwork which will be entered into your electronic medical record.  These signatures attest to the fact  that that the information above on your After Visit Summary has been reviewed and is understood.  Full responsibility of the confidentiality of this discharge information lies with you and/or your care-partner.

## 2021-09-24 NOTE — Progress Notes (Signed)
1330 Robinul 0.1 mg IV given due large amount of secretions upon assessment.  MD made aware, vss  °

## 2021-09-24 NOTE — Progress Notes (Signed)
Pt informed nurse she will have to check her calendar prior to scheduling a repeat EGD. SChaplin, RN,BSN

## 2021-09-24 NOTE — Op Note (Signed)
[facility] Patient Name: Marissa [name] Procedure Date: 09/24/2021 1:11 PM MRN: [id number] Endoscopist: Thornton Park MD, MD Age: 51 Referring MD:  Date of Birth: [birth year] Gender: Female Account #: [id number] Procedure:                Upper GI endoscopy Indications:              Iron deficiency anemia due to suspected upper                            gastrointestinal bleeding Medicines:                Monitored Anesthesia Care Procedure:                Pre-Anesthesia Assessment:                           - Prior to the procedure, a History and Physical                            was performed, and patient medications and                            allergies were reviewed. The patient's tolerance of                            previous anesthesia was also reviewed. The risks                            and benefits of the procedure and the sedation                            options and risks were discussed with the patient.                            All questions were answered, and informed consent                            was obtained. Prior Anticoagulants: The patient has                            taken Xarelto (rivaroxaban), last dose was 5 days                            prior to procedure. ASA Grade Assessment: II - A                            patient with mild systemic disease. After reviewing                            the risks and benefits, the patient was deemed in                            satisfactory condition to undergo the procedure.  After obtaining informed consent, the endoscope was                            passed under direct vision. Throughout the                            procedure, the patient's blood pressure, pulse, and                            oxygen saturations were monitored continuously. The                            GIF [id number] #[id number] was introduced through the                            mouth, and advanced  to the third part of duodenum.                            The upper GI endoscopy was accomplished without                            difficulty. The patient tolerated the procedure                            well. Scope In: Scope Out: Findings:                 The esophagus was normal.                           Two non-bleeding cratered gastric ulcers with                            pigmented material were found in the gastric                            antrum. The largest lesion was 5 mm in largest                            dimension. Biopsies were taken from the antrum,                            body, and fundus with a cold forceps for histology.                            Estimated blood loss was minimal.                           The examined duodenum was normal. Biopsies were                            taken with a cold forceps for histology. Estimated                            blood loss was  minimal.                           The cardia and gastric fundus were normal on                            retroflexion.                           The exam was otherwise without abnormality. Complications:            No immediate complications. Estimated Blood Loss:     Estimated blood loss was minimal. Impression:               - Normal esophagus.                           - Non-bleeding gastric ulcers with pigmented                            material. Biopsied.                           - Normal examined duodenum. Biopsied.                           - The examination was otherwise normal. Recommendation:           - Patient has a contact number available for                            emergencies. The signs and symptoms of potential                            delayed complications were discussed with the                            patient. Return to normal activities tomorrow.                            Written discharge instructions were provided to the                             patient.                           - Resume previous diet.                           - Continue present medications.                           - No aspirin, ibuprofen, naproxen, or other                            non-steroidal anti-inflammatory drugs.                           - Await pathology results.                           -  Pantoprazole 40 mg QAM x 8 weeks.                           - Consider repeat endoscopy in 8-10 weeks to assess                            for resolution of ulcers. Thornton Park MD, MD 09/24/2021 2:10:54 PM This report has been signed electronically.

## 2021-09-25 ENCOUNTER — Telehealth: Payer: Self-pay | Admitting: *Deleted

## 2021-09-25 NOTE — Telephone Encounter (Signed)
Post procedure follow up call placed, no answer and VM placed.

## 2021-10-20 ENCOUNTER — Encounter: Payer: Self-pay | Admitting: Gastroenterology

## 2021-10-22 ENCOUNTER — Other Ambulatory Visit: Payer: Self-pay | Admitting: Internal Medicine

## 2021-10-23 DIAGNOSIS — E119 Type 2 diabetes mellitus without complications: Secondary | ICD-10-CM | POA: Diagnosis not present

## 2021-10-26 ENCOUNTER — Other Ambulatory Visit: Payer: Self-pay | Admitting: Internal Medicine
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
## 2021-11-18 ENCOUNTER — Ambulatory Visit (INDEPENDENT_AMBULATORY_CARE_PROVIDER_SITE_OTHER): Payer: BC Managed Care – PPO | Admitting: Internal Medicine

## 2021-11-18 ENCOUNTER — Encounter: Payer: Self-pay | Admitting: Internal Medicine

## 2021-11-18 VITALS — BP 124/80 | HR 83 | Ht 65.0 in | Wt 225.0 lb

## 2021-11-18 DIAGNOSIS — E1165 Type 2 diabetes mellitus with hyperglycemia: Secondary | ICD-10-CM

## 2021-11-18 LAB — POCT GLYCOSYLATED HEMOGLOBIN (HGB A1C): Hemoglobin A1C: 7.7 % — AB (ref 4.0–5.6)

## 2021-11-18 MED ORDER — GLIPIZIDE 5 MG PO TABS: 5.0000 mg | ORAL_TABLET | Freq: Two times a day (BID) | ORAL | 1 refills | Status: DC

## 2021-11-18 MED ORDER — OZEMPIC (0.25 OR 0.5 MG/DOSE) 2 MG/3ML ~~LOC~~ SOPN: 0.5000 mg | PEN_INJECTOR | SUBCUTANEOUS | 3 refills | Status: DC

## 2021-11-18 MED ORDER — DAPAGLIFLOZIN PROPANEDIOL 10 MG PO TABS: 10.0000 mg | ORAL_TABLET | Freq: Every day | ORAL | 3 refills | Status: DC

## 2021-11-18 NOTE — Patient Instructions (Signed)
-   STOP Semglee  - Start Ozempic 0.25 mg once weekly for 6 weeks, than increase to 0.5 mg once weekly  - Start Glipizide 5 mg, 1 tablet before Breakfast, if fasting sugars are consistently over 150 , please Increase to 1 tablet before Breakfast and 1 tablet before supper  - Continue Farxiga 10 mg, 1 tablet every morning     HOW TO TREAT LOW BLOOD SUGARS (Blood sugar LESS THAN 70 MG/DL) Please follow the RULE OF 15 for the treatment of hypoglycemia treatment (when your (blood sugars are less than 70 mg/dL)   STEP 1: Take 15 grams of carbohydrates when your blood sugar is low, which includes:  3-4 GLUCOSE TABS  OR 3-4 OZ OF JUICE OR REGULAR SODA OR ONE TUBE OF GLUCOSE GEL    STEP 2: RECHECK blood sugar in 15 MINUTES STEP 3: If your blood sugar is still low at the 15 minute recheck --> then, go back to STEP 1 and treat AGAIN with another 15 grams of carbohydrates.

## 2021-11-22 DIAGNOSIS — E119 Type 2 diabetes mellitus without complications: Secondary | ICD-10-CM | POA: Diagnosis not present

## 2021-11-23 ENCOUNTER — Other Ambulatory Visit (HOSPITAL_COMMUNITY)
Admission: RE | Admit: 2021-11-23 | Discharge: 2021-11-23 | Disposition: A | Discharge status: Home / Self Care | Payer: BC Managed Care – PPO | Source: Ambulatory Visit | Attending: Nurse Practitioner | Admitting: Nurse Practitioner

## 2021-11-23 ENCOUNTER — Encounter: Payer: Self-pay | Admitting: Nurse Practitioner

## 2021-11-23 ENCOUNTER — Ambulatory Visit (INDEPENDENT_AMBULATORY_CARE_PROVIDER_SITE_OTHER): Payer: BC Managed Care – PPO | Admitting: Nurse Practitioner

## 2021-11-23 ENCOUNTER — Other Ambulatory Visit: Payer: Self-pay | Admitting: Family Medicine

## 2021-11-23 VITALS — BP 112/72 | HR 76 | Ht 66.25 in | Wt 225.0 lb

## 2021-11-23 DIAGNOSIS — Z01419 Encounter for gynecological examination (general) (routine) without abnormal findings: Secondary | ICD-10-CM | POA: Diagnosis not present

## 2021-11-23 DIAGNOSIS — Z23 Encounter for immunization: Secondary | ICD-10-CM

## 2021-11-23 DIAGNOSIS — Z780 Asymptomatic menopausal state: Secondary | ICD-10-CM

## 2021-11-23 DIAGNOSIS — Z1231 Encounter for screening mammogram for malignant neoplasm of breast: Secondary | ICD-10-CM

## 2021-11-23 DIAGNOSIS — N920 Excessive and frequent menstruation with regular cycle: Secondary | ICD-10-CM | POA: Diagnosis not present

## 2021-11-23 NOTE — Progress Notes (Signed)
Marissa [name] 03/10/1970 [id number]   History:  51 y.o. G2P0010 presents for annual exam. Started exercising in June and has had 2 episodes of spotting. LMP about 1 year ago. [place] 96 in Sept 2021. 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 Postmenopausal - no HRT, denies symptoms. Anemia managed by hematology, iron transfusions in the past, seems to have improved since cycles stopped. On Xarelto for DVT/PE. T2DM managed by endocrinology.   Gynecologic History No LMP recorded. (Menstrual status: Postmenopausal). LMP about 1 year ago.    Contraception: abstinence and post menopausal status Sexually active: No  Health maintenance Last Pap: 11/01/2017. Results were: normal. 5-year repeat Last mammogram: 12/24/2020. Results were: Normal  Last colonoscopy: 09/24/2021, 3-year recall Last Dexa: Not indicated  Past medical history, past surgical history, family history and social history were all reviewed and documented in the EPIC chart. Single. Works from home for [employer].   ROS:  A ROS was performed and pertinent positives and negatives are included.  Exam:  Vitals:   11/23/21 0925  BP: 112/72  Pulse: 76  SpO2: 98%  Weight: 225 lb (102.1 kg)  Height: 5' 6.25" (1.683 m)    Body mass index is 36.04 kg/m.  General appearance:  Normal Thyroid:  Symmetrical, normal in size, without palpable masses or nodularity. Respiratory  Auscultation:  Clear without wheezing or rhonchi Cardiovascular  Auscultation:  Regular rate, without rubs, murmurs or gallops  Edema/varicosities:  Not grossly evident Abdominal  Soft,nontender, without masses, guarding or rebound.  Liver/spleen:  No organomegaly noted  Hernia:  None appreciated  Skin  Inspection:  Grossly normal   Breasts: Examined lying and sitting.   Right: Without masses, retractions, discharge or axillary adenopathy.   Left: Without masses, retractions, discharge or axillary adenopathy. Genitourinary   Inguinal/mons:  Normal without  inguinal adenopathy  External genitalia:  Normal appearing vulva with no masses, tenderness, or lesions  BUS/Urethra/Skene's glands:  Normal  Vagina:  Normal appearing with normal color and discharge, no lesions  Cervix:  Normal appearing without discharge or lesions  Uterus:  Difficult to palpate due to body habitus but no gross masses or tenderness  Adnexa/parametria:     Rt: Normal in size, without masses or tenderness.   Lt: Normal in size, without masses or tenderness.  Anus and perineum: Normal  Digital rectal exam: Deferred  Patient informed chaperone available to be present for breast and pelvic exam. Patient has requested no chaperone to be present. Patient has been advised what will be completed during breast and pelvic exam.   Assessment/Plan:  51 y.o. G2P0010 for annual exam.   Well female exam with routine gynecological exam - Plan: Cytology - PAP( [place]). Education provided on SBEs, importance of preventative screenings, current guidelines, high calcium diet, regular exercise, and multivitamin daily.  Labs with endocrinology/hematology.   Postmenopausal - denies menopausal symptoms, no HRT. Had 2 episodes of very light spotting since June. Feels it was around time menses would start. Thought it was related to exercise since she started around that time. LMP about 1 year ago. Discussed PMB. If bleeding occurs again, ultrasound recommended and she is agreeable.   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 Pap today.   Screening for breast cancer - Normal mammogram history.  Continue annual screenings.  Normal breast exam today.  Screening for colon cancer - 09/2021 colonoscopy. Will repeat at GI's recommended interval.   Follow up in 1 year for annual.     Shawne Eskelson A  Marissa [name] [facility], 9:37 AM 11/23/2021

## 2021-11-24 LAB — CYTOLOGY - PAP
Adequacy: ABSENT
Comment: NEGATIVE
Diagnosis: NEGATIVE
High risk HPV: NEGATIVE

## 2021-12-22 ENCOUNTER — Other Ambulatory Visit: Payer: Self-pay | Admitting: Oncology

## 2021-12-22 DIAGNOSIS — I2609 Other pulmonary embolism with acute cor pulmonale: Secondary | ICD-10-CM

## 2021-12-22 NOTE — Telephone Encounter (Signed)
Refilled per most recent MD OV note to continue med

## 2021-12-23 DIAGNOSIS — E119 Type 2 diabetes mellitus without complications: Secondary | ICD-10-CM | POA: Diagnosis not present

## 2021-12-25 ENCOUNTER — Ambulatory Visit
Admission: RE | Admit: 2021-12-25 | Discharge: 2021-12-25 | Disposition: A | Discharge status: Home / Self Care | Payer: BC Managed Care – PPO | Source: Ambulatory Visit | Attending: Family Medicine | Admitting: Family Medicine

## 2021-12-25 DIAGNOSIS — Z1231 Encounter for screening mammogram for malignant neoplasm of breast: Secondary | ICD-10-CM

## 2022-01-19 ENCOUNTER — Encounter: Payer: Self-pay | Admitting: Gastroenterology

## 2022-01-22 DIAGNOSIS — E119 Type 2 diabetes mellitus without complications: Secondary | ICD-10-CM | POA: Diagnosis not present

## 2022-02-04 ENCOUNTER — Telehealth: Payer: Self-pay | Admitting: Oncology

## 2022-02-04 NOTE — Telephone Encounter (Signed)
Called patient per dr. Alen Blew transition. Patient notified and r/s with new provider

## 2022-03-29 ENCOUNTER — Other Ambulatory Visit: Payer: Self-pay | Admitting: Oncology

## 2022-03-29 DIAGNOSIS — I2609 Other pulmonary embolism with acute cor pulmonale: Secondary | ICD-10-CM

## 2022-03-30 ENCOUNTER — Other Ambulatory Visit: Payer: Self-pay

## 2022-04-02 ENCOUNTER — Encounter: Payer: Self-pay | Admitting: Internal Medicine

## 2022-04-02 ENCOUNTER — Ambulatory Visit (INDEPENDENT_AMBULATORY_CARE_PROVIDER_SITE_OTHER): Payer: BC Managed Care – PPO | Admitting: Internal Medicine

## 2022-04-02 VITALS — BP 122/80 | HR 88 | Ht 66.25 in | Wt 223.0 lb

## 2022-04-02 DIAGNOSIS — E119 Type 2 diabetes mellitus without complications: Secondary | ICD-10-CM | POA: Diagnosis not present

## 2022-04-02 DIAGNOSIS — E785 Hyperlipidemia, unspecified: Secondary | ICD-10-CM | POA: Diagnosis not present

## 2022-04-02 LAB — COMPREHENSIVE METABOLIC PANEL
ALT: 18 U/L (ref 0–35)
AST: 17 U/L (ref 0–37)
Albumin: 4.1 g/dL (ref 3.5–5.2)
Alkaline Phosphatase: 93 U/L (ref 39–117)
BUN: 16 mg/dL (ref 6–23)
CO2: 27 mEq/L (ref 19–32)
Calcium: 9.5 mg/dL (ref 8.4–10.5)
Chloride: 105 mEq/L (ref 96–112)
Creatinine, Ser: 1.03 mg/dL (ref 0.40–1.20)
GFR: 62.4 mL/min (ref >60.00)
Glucose, Bld: 132 mg/dL — ABNORMAL HIGH (ref 70–99)
Potassium: 3.5 mEq/L (ref 3.5–5.1)
Sodium: 141 mEq/L (ref 135–145)
Total Bilirubin: 0.4 mg/dL (ref 0.2–1.2)
Total Protein: 6.9 g/dL (ref 6.0–8.3)

## 2022-04-02 LAB — LIPID PANEL
Cholesterol: 155 mg/dL (ref 0–200)
HDL: 51.7 mg/dL (ref >39.00)
LDL Cholesterol: 77 mg/dL (ref 0–99)
NonHDL: 103.11
Total CHOL/HDL Ratio: 3
Triglycerides: 130 mg/dL (ref 0.0–149.0)
VLDL: 26 mg/dL (ref 0.0–40.0)

## 2022-04-02 LAB — POCT GLYCOSYLATED HEMOGLOBIN (HGB A1C): Hemoglobin A1C: 5.5 % (ref 4.0–5.6)

## 2022-04-02 LAB — MICROALBUMIN / CREATININE URINE RATIO
Creatinine,U: 68.2 mg/dL
Microalb Creat Ratio: 1 mg/g (ref 0.0–30.0)
Microalb, Ur: <0.7 mg/dL (ref 0.0–1.9)

## 2022-04-02 LAB — POCT GLUCOSE (DEVICE FOR HOME USE): POC Glucose: 123 mg/dl — AB (ref 70–99)

## 2022-04-02 LAB — TSH: TSH: 0.81 u[IU]/mL (ref 0.35–5.50)

## 2022-04-02 MED ORDER — DAPAGLIFLOZIN PROPANEDIOL 10 MG PO TABS: 10.0000 mg | ORAL_TABLET | Freq: Every day | ORAL | 3 refills | Status: DC

## 2022-04-02 MED ORDER — ATORVASTATIN CALCIUM 10 MG PO TABS: 10.0000 mg | ORAL_TABLET | Freq: Every day | ORAL | 3 refills | Status: DC

## 2022-04-02 MED ORDER — SEMAGLUTIDE (1 MG/DOSE) 4 MG/3ML ~~LOC~~ SOPN: 1.0000 mg | PEN_INJECTOR | SUBCUTANEOUS | 3 refills | Status: DC
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
## 2022-04-02 NOTE — Patient Instructions (Signed)
-   Stop Glipizide  - Increase Ozempic 1 mg once weekly  - Continue Farxiga 10 mg, 1 tablet every morning     HOW TO TREAT LOW BLOOD SUGARS (Blood sugar LESS THAN 70 MG/DL) Please follow the RULE OF 15 for the treatment of hypoglycemia treatment (when your (blood sugars are less than 70 mg/dL)   STEP 1: Take 15 grams of carbohydrates when your blood sugar is low, which includes:  3-4 GLUCOSE TABS  OR 3-4 OZ OF JUICE OR REGULAR SODA OR ONE TUBE OF GLUCOSE GEL    STEP 2: RECHECK blood sugar in 15 MINUTES STEP 3: If your blood sugar is still low at the 15 minute recheck --> then, go back to STEP 1 and treat AGAIN with another 15 grams of carbohydrates.

## 2022-04-08 ENCOUNTER — Telehealth: Payer: Self-pay

## 2022-04-08 NOTE — Telephone Encounter (Signed)
Ozempic needs a PA.

## 2022-04-23 ENCOUNTER — Telehealth: Payer: Self-pay

## 2022-04-23 ENCOUNTER — Encounter: Payer: Self-pay | Admitting: Internal Medicine

## 2022-04-23 ENCOUNTER — Other Ambulatory Visit (HOSPITAL_COMMUNITY): Payer: Self-pay

## 2022-04-23 NOTE — Telephone Encounter (Signed)
PA request received via provider for Ozempic (1 MG/DOSE) '4MG'$ /3ML pen-injectors  PA has been submitted via CMM to [facility] and is pending determination.  Key: [id number]

## 2022-04-23 NOTE — Telephone Encounter (Signed)
PA has been submitted and is pending determination, will be updated in additional encounter created.

## 2022-04-26 NOTE — Telephone Encounter (Signed)
PA has been APPROVED from 04/23/2022-04/23/2023. Approval letter has been attached in patients documents.

## 2022-06-21 DIAGNOSIS — E119 Type 2 diabetes mellitus without complications: Secondary | ICD-10-CM | POA: Diagnosis not present

## 2022-06-21 DIAGNOSIS — H524 Presbyopia: Secondary | ICD-10-CM | POA: Diagnosis not present

## 2022-06-27 ENCOUNTER — Other Ambulatory Visit: Payer: Self-pay | Admitting: Adult Health

## 2022-06-27 DIAGNOSIS — I2609 Other pulmonary embolism with acute cor pulmonale: Secondary | ICD-10-CM

## 2022-07-07 ENCOUNTER — Other Ambulatory Visit: Payer: Self-pay | Admitting: *Deleted

## 2022-07-07 DIAGNOSIS — I2609 Other pulmonary embolism with acute cor pulmonale: Secondary | ICD-10-CM

## 2022-07-07 DIAGNOSIS — D500 Iron deficiency anemia secondary to blood loss (chronic): Secondary | ICD-10-CM

## 2022-07-08 ENCOUNTER — Other Ambulatory Visit: Payer: Self-pay

## 2022-07-08 MED ORDER — DAPAGLIFLOZIN PROPANEDIOL 10 MG PO TABS: 10.0000 mg | ORAL_TABLET | Freq: Every day | ORAL | 3 refills | Status: DC

## 2022-07-09 ENCOUNTER — Encounter: Payer: Self-pay | Admitting: Adult Health

## 2022-07-09 ENCOUNTER — Inpatient Hospital Stay: Payer: BC Managed Care – PPO | Attending: Adult Health

## 2022-07-09 ENCOUNTER — Ambulatory Visit: Payer: BC Managed Care – PPO | Admitting: Oncology

## 2022-07-09 ENCOUNTER — Other Ambulatory Visit: Payer: BC Managed Care – PPO

## 2022-07-09 ENCOUNTER — Other Ambulatory Visit: Payer: Self-pay

## 2022-07-09 ENCOUNTER — Encounter: Payer: Self-pay | Admitting: Internal Medicine

## 2022-07-09 ENCOUNTER — Inpatient Hospital Stay (HOSPITAL_BASED_OUTPATIENT_CLINIC_OR_DEPARTMENT_OTHER): Payer: BC Managed Care – PPO | Admitting: Adult Health

## 2022-07-09 VITALS — BP 116/77 | HR 82 | Temp 97.7°F | Resp 16 | Wt 215.9 lb

## 2022-07-09 DIAGNOSIS — Z801 Family history of malignant neoplasm of trachea, bronchus and lung: Secondary | ICD-10-CM | POA: Diagnosis not present

## 2022-07-09 DIAGNOSIS — Z86711 Personal history of pulmonary embolism: Secondary | ICD-10-CM | POA: Diagnosis not present

## 2022-07-09 DIAGNOSIS — E1165 Type 2 diabetes mellitus with hyperglycemia: Secondary | ICD-10-CM | POA: Insufficient documentation

## 2022-07-09 DIAGNOSIS — D500 Iron deficiency anemia secondary to blood loss (chronic): Secondary | ICD-10-CM | POA: Insufficient documentation

## 2022-07-09 DIAGNOSIS — Z800 Family history of malignant neoplasm of digestive organs: Secondary | ICD-10-CM | POA: Diagnosis not present

## 2022-07-09 DIAGNOSIS — I2609 Other pulmonary embolism with acute cor pulmonale: Secondary | ICD-10-CM

## 2022-07-09 DIAGNOSIS — I82533 Chronic embolism and thrombosis of popliteal vein, bilateral: Secondary | ICD-10-CM | POA: Insufficient documentation

## 2022-07-09 DIAGNOSIS — E785 Hyperlipidemia, unspecified: Secondary | ICD-10-CM | POA: Insufficient documentation

## 2022-07-09 DIAGNOSIS — N920 Excessive and frequent menstruation with regular cycle: Secondary | ICD-10-CM | POA: Diagnosis not present

## 2022-07-09 DIAGNOSIS — Z7901 Long term (current) use of anticoagulants: Secondary | ICD-10-CM | POA: Diagnosis not present

## 2022-07-09 LAB — CBC WITH DIFFERENTIAL (CANCER CENTER ONLY)
Abs Immature Granulocytes: 0 10*3/uL (ref 0.00–0.07)
Basophils Absolute: 0 10*3/uL (ref 0.0–0.1)
Basophils Relative: 1 %
Eosinophils Absolute: 0.1 10*3/uL (ref 0.0–0.5)
Eosinophils Relative: 2 %
HCT: 41 % (ref 36.0–46.0)
Hemoglobin: 13.1 g/dL (ref 12.0–15.0)
Immature Granulocytes: 0 %
Lymphocytes Relative: 40 %
Lymphs Abs: 1.3 10*3/uL (ref 0.7–4.0)
MCH: 28.4 pg (ref 26.0–34.0)
MCHC: 32 g/dL (ref 30.0–36.0)
MCV: 88.9 fL (ref 80.0–100.0)
Monocytes Absolute: 0.3 10*3/uL (ref 0.1–1.0)
Monocytes Relative: 8 %
Neutro Abs: 1.6 10*3/uL — ABNORMAL LOW (ref 1.7–7.7)
Neutrophils Relative %: 49 %
Platelet Count: 220 10*3/uL (ref 150–400)
RBC: 4.61 MIL/uL (ref 3.87–5.11)
RDW: 12.5 % (ref 11.5–15.5)
WBC Count: 3.3 10*3/uL — ABNORMAL LOW (ref 4.0–10.5)
nRBC: 0 % (ref 0.0–0.2)

## 2022-07-09 LAB — CMP (CANCER CENTER ONLY)
ALT: 28 U/L (ref 0–44)
AST: 17 U/L (ref 15–41)
Albumin: 4.4 g/dL (ref 3.5–5.0)
Alkaline Phosphatase: 113 U/L (ref 38–126)
Anion gap: 6 (ref 5–15)
BUN: 14 mg/dL (ref 6–20)
CO2: 28 mmol/L (ref 22–32)
Calcium: 9.5 mg/dL (ref 8.9–10.3)
Chloride: 106 mmol/L (ref 98–111)
Creatinine: 1.03 mg/dL — ABNORMAL HIGH (ref 0.44–1.00)
GFR, Estimated: >60 mL/min (ref >60)
Glucose, Bld: 123 mg/dL — ABNORMAL HIGH (ref 70–99)
Potassium: 3.7 mmol/L (ref 3.5–5.1)
Sodium: 140 mmol/L (ref 135–145)
Total Bilirubin: 0.4 mg/dL (ref 0.3–1.2)
Total Protein: 7.4 g/dL (ref 6.5–8.1)

## 2022-07-09 LAB — IRON AND IRON BINDING CAPACITY (CC-WL,HP ONLY)
Iron: 87 ug/dL (ref 28–170)
Saturation Ratios: 27 % (ref 10.4–31.8)
TIBC: 322 ug/dL (ref 250–450)
UIBC: 235 ug/dL (ref 148–442)

## 2022-07-09 LAB — FERRITIN: Ferritin: 92 ng/mL (ref 11–307)

## 2022-07-09 MED ORDER — RIVAROXABAN 20 MG PO TABS
20.0000 mg | ORAL_TABLET | Freq: Every day | ORAL | 3 refills | Status: DC
Start: 2022-07-09 — End: 2022-10-05

## 2022-07-09 NOTE — Assessment & Plan Note (Signed)
Marissa [name] has a history of iron deficiency due to chronic blood loss from menorrhagia.  Her last menstrual cycle occurred in 2021 and her colonoscopy and upper endoscopy last year did not identify an etiology for iron deficiency.  Her iron studies are pending, and I am hopeful this may have resolved.
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
## 2022-07-09 NOTE — Assessment & Plan Note (Signed)
Marissa [name] is a 52 year old woman with recurrent DVT and PE on lifelong anticoagulation with Xarelto.  I explained lifelong anticoagulation is recommended due to recurrent nature of her DVT and PE, and her BMI is 34, which also can increase her risk in developing DVT.    She will cotninue on Xarelto 20mg .  As she loses weight over the next year, we can consider at her f/u in 1 year whether to decrease her Xarelto to 10mg  daily.    RTC in 1 year for labs and f/u with Dr. Pamelia Hoit.

## 2022-07-16 ENCOUNTER — Ambulatory Visit
Admission: EM | Admit: 2022-07-16 | Discharge: 2022-07-16 | Disposition: A | Discharge status: Home / Self Care | Payer: BC Managed Care – PPO | Attending: Family Medicine | Admitting: Family Medicine

## 2022-07-16 ENCOUNTER — Ambulatory Visit: Payer: BC Managed Care – PPO

## 2022-07-16 ENCOUNTER — Ambulatory Visit (INDEPENDENT_AMBULATORY_CARE_PROVIDER_SITE_OTHER): Payer: BC Managed Care – PPO

## 2022-07-16 DIAGNOSIS — S99921A Unspecified injury of right foot, initial encounter: Secondary | ICD-10-CM

## 2022-07-16 DIAGNOSIS — M7989 Other specified soft tissue disorders: Secondary | ICD-10-CM | POA: Diagnosis not present

## 2022-07-16 MED ORDER — CYCLOBENZAPRINE HCL 5 MG PO TABS: 5.0000 mg | ORAL_TABLET | Freq: Two times a day (BID) | ORAL | 0 refills | Status: DC | PRN

## 2022-07-16 NOTE — ED Triage Notes (Addendum)
Pt reports she hit her right foot on the foot of her love seat x 2 days. Pt states it hurts to walk on it and touch it. Took aspirin but no relief.   Pt has active ROM in right foot, but it hurt worse with movement from the outer part of the foot up to her toes. Denies ankle pain.
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
## 2022-07-16 NOTE — Discharge Instructions (Addendum)
Your x-ray shows no fracture or acute injury involving the right foot. Recommend wearing an ACE wrap on right foot to reduce swelling. Apply ice to foot and elevate while at home. I am prescribing cyclobenzaprine twice daily as needed for pain (this medication can cause drowsiness therefore only take when you are at home and not operating a vehicle).

## 2022-07-20 ENCOUNTER — Other Ambulatory Visit: Payer: Self-pay

## 2022-08-06 ENCOUNTER — Other Ambulatory Visit: Payer: Self-pay

## 2022-08-06 ENCOUNTER — Other Ambulatory Visit (HOSPITAL_COMMUNITY): Payer: Self-pay

## 2022-08-06 MED ORDER — RIVAROXABAN 20 MG PO TABS
20.0000 mg | ORAL_TABLET | Freq: Every day | ORAL | 0 refills | Status: DC
  Filled 2022-08-06: qty 10, 10d supply, fill #0

## 2022-08-06 NOTE — Progress Notes (Signed)
Pt called stating per [facility] as well as other local [facility] are out of Xarelto. She states her savings card through the manufacturer is also not working, but that is a Scientist, product/process development difficulty by the manufacturer and they are working to resolve the issue. She is asking for a short fill of Xarelto 20 mg to go to [facility] as they do have this medication in stock. Medication sent via MD. She is aware and verbalized thanks.

## 2022-08-13 ENCOUNTER — Ambulatory Visit
Admission: EM | Admit: 2022-08-13 | Discharge: 2022-08-13 | Disposition: A | Discharge status: Home / Self Care | Payer: BC Managed Care – PPO | Attending: Family Medicine | Admitting: Family Medicine

## 2022-08-13 DIAGNOSIS — J019 Acute sinusitis, unspecified: Secondary | ICD-10-CM

## 2022-08-13 MED ORDER — PROMETHAZINE-DM 6.25-15 MG/5ML PO SYRP: 5.0000 mL | ORAL_SOLUTION | Freq: Three times a day (TID) | ORAL | 0 refills | Status: DC | PRN

## 2022-08-13 MED ORDER — AMOXICILLIN-POT CLAVULANATE 875-125 MG PO TABS: 1.0000 | ORAL_TABLET | Freq: Two times a day (BID) | ORAL | 0 refills | Status: AC

## 2022-08-13 NOTE — ED Triage Notes (Signed)
Pt reports she has had throat pain  with some burning and itching x 1 week. She also has sinus pressure and a cough.  Took mucinex, tylenol, and robitussin but only slight relief.
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
## 2022-10-01 ENCOUNTER — Ambulatory Visit: Payer: BC Managed Care – PPO | Admitting: Internal Medicine

## 2022-10-01 ENCOUNTER — Encounter: Payer: Self-pay | Admitting: Internal Medicine

## 2022-10-01 VITALS — BP 124/80 | HR 82 | Ht 66.25 in | Wt 212.0 lb

## 2022-10-01 DIAGNOSIS — E119 Type 2 diabetes mellitus without complications: Secondary | ICD-10-CM | POA: Diagnosis not present

## 2022-10-01 DIAGNOSIS — E785 Hyperlipidemia, unspecified: Secondary | ICD-10-CM | POA: Diagnosis not present

## 2022-10-01 LAB — POCT GLYCOSYLATED HEMOGLOBIN (HGB A1C): Hemoglobin A1C: 6.1 % — AB (ref 4.0–5.6)

## 2022-10-01 MED ORDER — SEMAGLUTIDE (1 MG/DOSE) 4 MG/3ML ~~LOC~~ SOPN: 1.0000 mg | PEN_INJECTOR | SUBCUTANEOUS | 3 refills | Status: DC

## 2022-10-01 MED ORDER — ATORVASTATIN CALCIUM 10 MG PO TABS: 10.0000 mg | ORAL_TABLET | Freq: Every day | ORAL | 3 refills | Status: DC

## 2022-10-01 MED ORDER — DAPAGLIFLOZIN PROPANEDIOL 10 MG PO TABS: 10.0000 mg | ORAL_TABLET | Freq: Every day | ORAL | 3 refills | Status: DC

## 2022-10-01 NOTE — Patient Instructions (Signed)
-   Continue Ozempic 1 mg once weekly  - Continue Farxiga 10 mg, 1 tablet every morning     HOW TO TREAT LOW BLOOD SUGARS (Blood sugar LESS THAN 70 MG/DL) Please follow the RULE OF 15 for the treatment of hypoglycemia treatment (when your (blood sugars are less than 70 mg/dL)   STEP 1: Take 15 grams of carbohydrates when your blood sugar is low, which includes:  3-4 GLUCOSE TABS  OR 3-4 OZ OF JUICE OR REGULAR SODA OR ONE TUBE OF GLUCOSE GEL    STEP 2: RECHECK blood sugar in 15 MINUTES STEP 3: If your blood sugar is still low at the 15 minute recheck --> then, go back to STEP 1 and treat AGAIN with another 15 grams of carbohydrates.

## 2022-10-05 ENCOUNTER — Ambulatory Visit (INDEPENDENT_AMBULATORY_CARE_PROVIDER_SITE_OTHER): Payer: BC Managed Care – PPO | Admitting: Family Medicine

## 2022-10-05 ENCOUNTER — Encounter: Payer: Self-pay | Admitting: Family Medicine

## 2022-10-05 DIAGNOSIS — Z7984 Long term (current) use of oral hypoglycemic drugs: Secondary | ICD-10-CM

## 2022-10-05 DIAGNOSIS — Z7985 Long-term (current) use of injectable non-insulin antidiabetic drugs: Secondary | ICD-10-CM

## 2022-10-05 DIAGNOSIS — Z7689 Persons encountering health services in other specified circumstances: Secondary | ICD-10-CM

## 2022-10-05 DIAGNOSIS — L02419 Cutaneous abscess of limb, unspecified: Secondary | ICD-10-CM | POA: Diagnosis not present

## 2022-10-05 DIAGNOSIS — I82533 Chronic embolism and thrombosis of popliteal vein, bilateral: Secondary | ICD-10-CM

## 2022-10-05 DIAGNOSIS — E1165 Type 2 diabetes mellitus with hyperglycemia: Secondary | ICD-10-CM | POA: Diagnosis not present

## 2022-10-05 MED ORDER — CEPHALEXIN 500 MG PO CAPS: 500.0000 mg | ORAL_CAPSULE | Freq: Three times a day (TID) | ORAL | 0 refills | Status: DC
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
## 2022-10-05 NOTE — Progress Notes (Unsigned)
Patient is here to established care with provider today. Patient has many health concern they would like to discuss with provider today  Care gaps discuss at appointment today  

## 2022-10-28 DIAGNOSIS — M25562 Pain in left knee: Secondary | ICD-10-CM | POA: Diagnosis not present

## 2022-11-25 ENCOUNTER — Ambulatory Visit (INDEPENDENT_AMBULATORY_CARE_PROVIDER_SITE_OTHER): Payer: BC Managed Care – PPO | Admitting: Nurse Practitioner

## 2022-11-25 ENCOUNTER — Encounter: Payer: Self-pay | Admitting: Nurse Practitioner

## 2022-11-25 VITALS — BP 102/70 | HR 81 | Ht 65.75 in | Wt 209.0 lb

## 2022-11-25 DIAGNOSIS — Z01419 Encounter for gynecological examination (general) (routine) without abnormal findings: Secondary | ICD-10-CM | POA: Diagnosis not present

## 2022-11-25 DIAGNOSIS — N951 Menopausal and female climacteric states: Secondary | ICD-10-CM | POA: Diagnosis not present

## 2022-11-25 NOTE — Progress Notes (Signed)
Marissa [name] 1970/08/02 [id number]   History:  52 y.o. G2P0010 presents for annual exam. Perimenopausal. LMP June 2024. Unsure of PMP but it was less than a year prior. Has occasional night sweats.  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 Anemia managed by hematology, iron transfusions in the past, seems to have improved since cycles stopped. On Xarelto for DVT/PE. T2DM managed by endocrinology.   Gynecologic History No LMP recorded. Patient is perimenopausal. LMP 07/2022 Contraception: abstinence Sexually active: No  Health maintenance Last Pap: 11/23/2021. Results were: Normal neg HPV, 5-year repeat Last mammogram: 12/25/2021. Results were: Normal  Last colonoscopy: 09/24/2021, 3-year recall Last Dexa: Not indicated  Past medical history, past surgical history, family history and social history were all reviewed and documented in the EPIC chart. Single. Works from home for [employer].   ROS:  A ROS was performed and pertinent positives and negatives are included.  Exam:  Vitals:   11/25/22 0917  BP: 102/70  Pulse: 81  SpO2: 99%  Weight: 209 lb (94.8 kg)  Height: 5' 5.75" (1.67 m)     Body mass index is 33.99 kg/m.  General appearance:  Normal Thyroid:  Symmetrical, normal in size, without palpable masses or nodularity. Respiratory  Auscultation:  Clear without wheezing or rhonchi Cardiovascular  Auscultation:  Regular rate, without rubs, murmurs or gallops  Edema/varicosities:  Not grossly evident Abdominal  Soft,nontender, without masses, guarding or rebound.  Liver/spleen:  No organomegaly noted  Hernia:  None appreciated  Skin  Inspection:  Grossly normal   Breasts: Examined lying and sitting.   Right: Without masses, retractions, discharge or axillary adenopathy.   Left: Without masses, retractions, discharge or axillary adenopathy. Pelvic: External genitalia:  no lesions              Urethra:  normal appearing urethra with no masses, tenderness or lesions               Bartholins and Skenes: normal                 Vagina: normal appearing vagina with normal color and discharge, no lesions              Cervix: no lesions Bimanual Exam:  Uterus:  no masses or tenderness              Adnexa: no mass, fullness, tenderness              Rectovaginal: Deferred              Anus:  normal, no lesions  Patient informed chaperone available to be present for breast and pelvic exam. Patient has requested no chaperone to be present. Patient has been advised what will be completed during breast and pelvic exam.   Assessment/Plan:  52 y.o. G2P0010 for annual exam.   Well female exam with routine gynecological exam - Education provided on SBEs, importance of preventative screenings, current guidelines, high calcium diet, regular exercise, and multivitamin daily.  Labs with endocrinology/hematology.   Perimenopausal - Has an occasional night sweat. LMP June 2024. Educated on what to expect during perimenopause.   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 Will repeat at 5-year interval per guidelines.   Screening for breast cancer - Normal mammogram history.  Continue annual screenings.  Normal breast exam today.  Screening for colon cancer - 09/2021 colonoscopy. Will repeat at GI's recommended interval.   Follow up in 1 year for annual.     Jonnette Nuon  [facility], 9:40 AM 11/25/2022

## 2022-12-29 ENCOUNTER — Other Ambulatory Visit: Payer: Self-pay | Admitting: Family Medicine

## 2022-12-29 DIAGNOSIS — Z1231 Encounter for screening mammogram for malignant neoplasm of breast: Secondary | ICD-10-CM

## 2023-01-05 ENCOUNTER — Ambulatory Visit: Payer: BC Managed Care – PPO | Admitting: Family Medicine

## 2023-01-05 ENCOUNTER — Encounter: Payer: Self-pay | Admitting: Family Medicine

## 2023-01-05 VITALS — BP 125/80 | HR 87 | Temp 98.7°F | Resp 16 | Ht 66.65 in | Wt 208.4 lb

## 2023-01-05 DIAGNOSIS — Z1159 Encounter for screening for other viral diseases: Secondary | ICD-10-CM

## 2023-01-05 DIAGNOSIS — Z23 Encounter for immunization: Secondary | ICD-10-CM

## 2023-01-05 DIAGNOSIS — Z0001 Encounter for general adult medical examination with abnormal findings: Secondary | ICD-10-CM | POA: Diagnosis not present

## 2023-01-05 DIAGNOSIS — Z13220 Encounter for screening for lipoid disorders: Secondary | ICD-10-CM | POA: Diagnosis not present

## 2023-01-05 DIAGNOSIS — Z1329 Encounter for screening for other suspected endocrine disorder: Secondary | ICD-10-CM | POA: Diagnosis not present

## 2023-01-05 DIAGNOSIS — Z130 Encounter for screening for diseases of the blood and blood-forming organs and certain disorders involving the immune mechanism: Secondary | ICD-10-CM

## 2023-01-05 DIAGNOSIS — E1165 Type 2 diabetes mellitus with hyperglycemia: Secondary | ICD-10-CM

## 2023-01-05 DIAGNOSIS — Z7985 Long-term (current) use of injectable non-insulin antidiabetic drugs: Secondary | ICD-10-CM

## 2023-01-05 DIAGNOSIS — Z7984 Long term (current) use of oral hypoglycemic drugs: Secondary | ICD-10-CM

## 2023-01-05 DIAGNOSIS — Z0000 Encounter for general adult medical examination without abnormal findings: Secondary | ICD-10-CM

## 2023-01-05 DIAGNOSIS — Z13228 Encounter for screening for other metabolic disorders: Secondary | ICD-10-CM | POA: Diagnosis not present

## 2023-01-06 LAB — CBC WITH DIFFERENTIAL/PLATELET
Basophils Absolute: 0 10*3/uL (ref 0.0–0.2)
Basos: 1 %
EOS (ABSOLUTE): 0.1 10*3/uL (ref 0.0–0.4)
Eos: 1 %
Hematocrit: 42.4 % (ref 34.0–46.6)
Hemoglobin: 13.5 g/dL (ref 11.1–15.9)
Immature Grans (Abs): 0 10*3/uL (ref 0.0–0.1)
Immature Granulocytes: 0 %
Lymphocytes Absolute: 1.4 10*3/uL (ref 0.7–3.1)
Lymphs: 38 %
MCH: 27.9 pg (ref 26.6–33.0)
MCHC: 31.8 g/dL (ref 31.5–35.7)
MCV: 88 fL (ref 79–97)
Monocytes Absolute: 0.4 10*3/uL (ref 0.1–0.9)
Monocytes: 10 %
Neutrophils Absolute: 1.8 10*3/uL (ref 1.4–7.0)
Neutrophils: 50 %
Platelets: 260 10*3/uL (ref 150–450)
RBC: 4.84 x10E6/uL (ref 3.77–5.28)
RDW: 12.6 % (ref 11.7–15.4)
WBC: 3.7 10*3/uL (ref 3.4–10.8)

## 2023-01-06 LAB — CMP14+EGFR
ALT: 41 [IU]/L — ABNORMAL HIGH (ref 0–32)
AST: 28 [IU]/L (ref 0–40)
Albumin: 4.3 g/dL (ref 3.8–4.9)
Alkaline Phosphatase: 140 [IU]/L — ABNORMAL HIGH (ref 44–121)
BUN/Creatinine Ratio: 13 (ref 9–23)
BUN: 12 mg/dL (ref 6–24)
Bilirubin Total: 0.3 mg/dL (ref 0.0–1.2)
CO2: 21 mmol/L (ref 20–29)
Calcium: 9.8 mg/dL (ref 8.7–10.2)
Chloride: 106 mmol/L (ref 96–106)
Creatinine, Ser: 0.95 mg/dL (ref 0.57–1.00)
Globulin, Total: 2.3 g/dL (ref 1.5–4.5)
Glucose: 80 mg/dL (ref 70–99)
Potassium: 4.1 mmol/L (ref 3.5–5.2)
Sodium: 144 mmol/L (ref 134–144)
Total Protein: 6.6 g/dL (ref 6.0–8.5)
eGFR: 72 mL/min/{1.73_m2} (ref >59)

## 2023-01-06 LAB — LIPID PANEL
Chol/HDL Ratio: 2.3 ratio (ref 0.0–4.4)
Cholesterol, Total: 122 mg/dL (ref 100–199)
HDL: 53 mg/dL (ref >39)
LDL Chol Calc (NIH): 53 mg/dL (ref 0–99)
Triglycerides: 78 mg/dL (ref 0–149)
VLDL Cholesterol Cal: 16 mg/dL (ref 5–40)

## 2023-01-06 LAB — TSH: TSH: 0.424 u[IU]/mL — ABNORMAL LOW (ref 0.450–4.500)

## 2023-01-06 LAB — HEMOGLOBIN A1C
Est. average glucose Bld gHb Est-mCnc: 140 mg/dL
Hgb A1c MFr Bld: 6.5 % — ABNORMAL HIGH (ref 4.8–5.6)

## 2023-01-06 LAB — VITAMIN D 25 HYDROXY (VIT D DEFICIENCY, FRACTURES): Vit D, 25-Hydroxy: 17.3 ng/mL — ABNORMAL LOW (ref 30.0–100.0)

## 2023-01-06 LAB — HEPATITIS C ANTIBODY: Hep C Virus Ab: NONREACTIVE

## 2023-01-10 ENCOUNTER — Encounter: Payer: Self-pay | Admitting: Family Medicine

## 2023-01-10 MED ORDER — VITAMIN D (ERGOCALCIFEROL) 1.25 MG (50000 UNIT) PO CAPS: 50000.0000 [IU] | ORAL_CAPSULE | ORAL | 0 refills | Status: AC
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
## 2023-01-28 ENCOUNTER — Ambulatory Visit
Admission: RE | Admit: 2023-01-28 | Discharge: 2023-01-28 | Disposition: A | Discharge status: Home / Self Care | Payer: BC Managed Care – PPO | Source: Ambulatory Visit

## 2023-01-28 DIAGNOSIS — Z1231 Encounter for screening mammogram for malignant neoplasm of breast: Secondary | ICD-10-CM | POA: Diagnosis not present

## 2023-03-11 ENCOUNTER — Telehealth: Payer: Self-pay

## 2023-03-11 MED ORDER — GLIPIZIDE 5 MG PO TABS: 5.0000 mg | ORAL_TABLET | Freq: Two times a day (BID) | ORAL | 3 refills | Status: DC

## 2023-03-11 NOTE — Telephone Encounter (Signed)
Patient unable to afford the Ozempic and would like to know if Glipizide can be sent to pharmacy

## 2023-04-01 ENCOUNTER — Other Ambulatory Visit: Payer: Self-pay | Admitting: Family Medicine

## 2023-04-04 ENCOUNTER — Encounter: Payer: Self-pay | Admitting: Internal Medicine

## 2023-04-04 ENCOUNTER — Ambulatory Visit (INDEPENDENT_AMBULATORY_CARE_PROVIDER_SITE_OTHER): Payer: BC Managed Care – PPO | Admitting: Internal Medicine

## 2023-04-04 VITALS — BP 126/74 | HR 80

## 2023-04-04 DIAGNOSIS — E119 Type 2 diabetes mellitus without complications: Secondary | ICD-10-CM | POA: Diagnosis not present

## 2023-04-04 DIAGNOSIS — E785 Hyperlipidemia, unspecified: Secondary | ICD-10-CM | POA: Diagnosis not present

## 2023-04-04 DIAGNOSIS — Z7984 Long term (current) use of oral hypoglycemic drugs: Secondary | ICD-10-CM | POA: Diagnosis not present

## 2023-04-04 LAB — POCT GLYCOSYLATED HEMOGLOBIN (HGB A1C): Hemoglobin A1C: 5.9 % — AB (ref 4.0–5.6)

## 2023-04-04 MED ORDER — ATORVASTATIN CALCIUM 10 MG PO TABS: 10.0000 mg | ORAL_TABLET | Freq: Every day | ORAL | 3 refills | Status: DC

## 2023-04-04 MED ORDER — GLIPIZIDE 5 MG PO TABS: 5.0000 mg | ORAL_TABLET | Freq: Every day | ORAL | 3 refills | Status: DC

## 2023-04-04 MED ORDER — DAPAGLIFLOZIN PROPANEDIOL 10 MG PO TABS: 10.0000 mg | ORAL_TABLET | Freq: Every day | ORAL | 3 refills | Status: DC

## 2023-04-04 NOTE — Patient Instructions (Signed)
-   Decrease Glipizide  5 mg, 1 tablet before Breakfast  - Continue Farxiga  10 mg, 1 tablet every morning     HOW TO TREAT LOW BLOOD SUGARS (Blood sugar LESS THAN 70 MG/DL) Please follow the RULE OF 15 for the treatment of hypoglycemia treatment (when your (blood sugars are less than 70 mg/dL)   STEP 1: Take 15 grams of carbohydrates when your blood sugar is low, which includes:  3-4 GLUCOSE TABS  OR 3-4 OZ OF JUICE OR REGULAR SODA OR ONE TUBE OF GLUCOSE GEL    STEP 2: RECHECK blood sugar in 15 MINUTES STEP 3: If your blood sugar is still low at the 15 minute recheck --> then, go back to STEP 1 and treat AGAIN with another 15 grams of carbohydrates.
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
## 2023-05-09 ENCOUNTER — Other Ambulatory Visit (HOSPITAL_COMMUNITY): Payer: Self-pay

## 2023-06-15 ENCOUNTER — Encounter: Payer: Self-pay | Admitting: Emergency Medicine

## 2023-06-15 ENCOUNTER — Ambulatory Visit
Admission: EM | Admit: 2023-06-15 | Discharge: 2023-06-15 | Disposition: A | Discharge status: Home / Self Care | Attending: Emergency Medicine | Admitting: Emergency Medicine

## 2023-06-15 ENCOUNTER — Ambulatory Visit: Payer: Self-pay

## 2023-06-15 DIAGNOSIS — R519 Headache, unspecified: Secondary | ICD-10-CM

## 2023-06-15 DIAGNOSIS — J309 Allergic rhinitis, unspecified: Secondary | ICD-10-CM | POA: Diagnosis not present

## 2023-06-15 MED ORDER — CETIRIZINE HCL 10 MG PO TABS: 10.0000 mg | ORAL_TABLET | Freq: Every day | ORAL | 0 refills | Status: DC

## 2023-06-15 MED ORDER — FLUTICASONE PROPIONATE 50 MCG/ACT NA SUSP: 2.0000 | Freq: Every day | NASAL | 2 refills | Status: DC

## 2023-06-15 NOTE — ED Triage Notes (Signed)
 Pt reports intermittent headaches x2 weeks. Reports pressure in anterior head. Notes one episode of blurred vision with the headache today. Pt cannot pinpoint causative factor. Wondering if it's related to seasonal allergies. Pt reports taking tylenol  and tylenol  severe sinus with no relief from headaches. No recurrent hx of headaches.

## 2023-06-15 NOTE — Telephone Encounter (Signed)
 Copied from CRM [id number]. Topic: Clinical - Red Word Triage >> Jun 15, 2023 12:29 PM Oddis Bench wrote: Red Word that prompted transfer to Nurse Triage: Patient is calling that she is having headaches and sneezing she would like a prescription called in.   Chief Complaint: Headache  Symptoms: Headache, sinus congestion  Frequency: Intermittent  Disposition: [] ED /[x] Urgent Care (no appt availability in office) / [] Appointment(In office/virtual)/ []  [facility]/ [] Home Care/ [] Refused Recommended Disposition /[] [facility]/ []  Follow-up with PCP Additional Notes: Patient reports she has been experiencing intermittent headaches for the last 2 week. She states her headaches are a dull pain in her forehead between her eyebrows. She states that with her headaches she has been experiencing sinus congestion. Patient advised of no appointments in the office until next week and she will go to urgent care for treatment.     Reason for Disposition  [1] MILD-MODERATE headache AND [2] present > 72 hours  Answer Assessment - Initial Assessment Questions 1. LOCATION: "Where does it hurt?"      Frontal  2. ONSET: "When did the headache start?" (Minutes, hours or days)      2 weeks ago  3. PATTERN: "Does the pain come and go, or has it been constant since it started?"     Intermittent  4. SEVERITY: "How bad is the pain?" and "What does it keep you from doing?"  (e.g., Scale 1-10; mild, moderate, or severe)   - MILD (1-3): doesn't interfere with normal activities    - MODERATE (4-7): interferes with normal activities or awakens from sleep    - SEVERE (8-10): excruciating pain, unable to do any normal activities        Dull pain 5. RECURRENT SYMPTOM: "Have you ever had headaches before?" If Yes, ask: "When was the last time?" and "What happened that time?"      No 6. CAUSE: "What do you think is causing the headache?"     Believes it might be sinuses  7. MIGRAINE: "Have you been  diagnosed with migraine headaches?" If Yes, ask: "Is this headache similar?"      No 8. HEAD INJURY: "Has there been any recent injury to the head?"      No 9. OTHER SYMPTOMS: "Do you have any other symptoms?" (fever, stiff neck, eye pain, sore throat, cold symptoms)     Sinus congestion  Protocols used: Headache-A-AH

## 2023-06-15 NOTE — Discharge Instructions (Addendum)
 Your symptoms appear to be related to allergies.  Take the daily antihistamine.  I suggest doing a nasal spray as well.  Over-the-counter saline nasal rinses with filtered water, such as a Nettie pot, can help manually declog your sinuses.  Avoiding the outdoors, avoid touching your face, and washing your hands frequently may help with your allergy symptoms as well.  Follow-up with your primary care provider if your symptoms persist.  Return to clinic for any new or urgent symptoms.
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
## 2023-06-21 DIAGNOSIS — E119 Type 2 diabetes mellitus without complications: Secondary | ICD-10-CM | POA: Diagnosis not present

## 2023-06-21 DIAGNOSIS — H524 Presbyopia: Secondary | ICD-10-CM | POA: Diagnosis not present

## 2023-06-21 LAB — HM DIABETES EYE EXAM

## 2023-07-08 ENCOUNTER — Other Ambulatory Visit: Payer: Self-pay | Admitting: *Deleted

## 2023-07-08 DIAGNOSIS — D500 Iron deficiency anemia secondary to blood loss (chronic): Secondary | ICD-10-CM

## 2023-07-11 ENCOUNTER — Inpatient Hospital Stay: Payer: BC Managed Care – PPO | Admitting: Hematology and Oncology

## 2023-07-11 ENCOUNTER — Inpatient Hospital Stay: Payer: BC Managed Care – PPO | Attending: Hematology and Oncology

## 2023-07-11 ENCOUNTER — Ambulatory Visit: Admitting: Family

## 2023-07-11 VITALS — BP 120/62 | HR 91 | Temp 97.9°F | Resp 18 | Ht 66.55 in | Wt 225.0 lb

## 2023-07-11 DIAGNOSIS — Z86711 Personal history of pulmonary embolism: Secondary | ICD-10-CM | POA: Diagnosis not present

## 2023-07-11 DIAGNOSIS — Z7901 Long term (current) use of anticoagulants: Secondary | ICD-10-CM | POA: Diagnosis not present

## 2023-07-11 DIAGNOSIS — I82533 Chronic embolism and thrombosis of popliteal vein, bilateral: Secondary | ICD-10-CM

## 2023-07-11 DIAGNOSIS — E669 Obesity, unspecified: Secondary | ICD-10-CM | POA: Diagnosis not present

## 2023-07-11 DIAGNOSIS — D500 Iron deficiency anemia secondary to blood loss (chronic): Secondary | ICD-10-CM | POA: Diagnosis not present

## 2023-07-11 DIAGNOSIS — E119 Type 2 diabetes mellitus without complications: Secondary | ICD-10-CM | POA: Diagnosis not present

## 2023-07-11 LAB — IRON AND IRON BINDING CAPACITY (CC-WL,HP ONLY)
Iron: 79 ug/dL (ref 28–170)
Saturation Ratios: 24 % (ref 10.4–31.8)
TIBC: 330 ug/dL (ref 250–450)
UIBC: 251 ug/dL (ref 148–442)

## 2023-07-11 LAB — CBC WITH DIFFERENTIAL (CANCER CENTER ONLY)
Abs Immature Granulocytes: 0.01 10*3/uL (ref 0.00–0.07)
Basophils Absolute: 0 10*3/uL (ref 0.0–0.1)
Basophils Relative: 1 %
Eosinophils Absolute: 0.1 10*3/uL (ref 0.0–0.5)
Eosinophils Relative: 2 %
HCT: 39 % (ref 36.0–46.0)
Hemoglobin: 12.2 g/dL (ref 12.0–15.0)
Immature Granulocytes: 0 %
Lymphocytes Relative: 24 %
Lymphs Abs: 1.2 10*3/uL (ref 0.7–4.0)
MCH: 27.3 pg (ref 26.0–34.0)
MCHC: 31.3 g/dL (ref 30.0–36.0)
MCV: 87.2 fL (ref 80.0–100.0)
Monocytes Absolute: 0.4 10*3/uL (ref 0.1–1.0)
Monocytes Relative: 8 %
Neutro Abs: 3.2 10*3/uL (ref 1.7–7.7)
Neutrophils Relative %: 65 %
Platelet Count: 224 10*3/uL (ref 150–400)
RBC: 4.47 MIL/uL (ref 3.87–5.11)
RDW: 13.2 % (ref 11.5–15.5)
WBC Count: 5 10*3/uL (ref 4.0–10.5)
nRBC: 0 % (ref 0.0–0.2)

## 2023-07-11 LAB — CMP (CANCER CENTER ONLY)
ALT: 51 U/L — ABNORMAL HIGH (ref 0–44)
AST: 30 U/L (ref 15–41)
Albumin: 4.1 g/dL (ref 3.5–5.0)
Alkaline Phosphatase: 103 U/L (ref 38–126)
Anion gap: 6 (ref 5–15)
BUN: 14 mg/dL (ref 6–20)
CO2: 27 mmol/L (ref 22–32)
Calcium: 9.2 mg/dL (ref 8.9–10.3)
Chloride: 108 mmol/L (ref 98–111)
Creatinine: 0.97 mg/dL (ref 0.44–1.00)
GFR, Estimated: >60 mL/min (ref >60)
Glucose, Bld: 135 mg/dL — ABNORMAL HIGH (ref 70–99)
Potassium: 3.5 mmol/L (ref 3.5–5.1)
Sodium: 141 mmol/L (ref 135–145)
Total Bilirubin: 0.4 mg/dL (ref 0.0–1.2)
Total Protein: 7.1 g/dL (ref 6.5–8.1)

## 2023-07-11 LAB — FERRITIN: Ferritin: 137 ng/mL (ref 11–307)

## 2023-07-11 MED ORDER — RIVAROXABAN 20 MG PO TABS: 20.0000 mg | ORAL_TABLET | Freq: Every day | ORAL | 11 refills | Status: AC

## 2023-07-11 NOTE — Assessment & Plan Note (Signed)
 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  (2015: DVT and PE, 2018: Right leg DVT) Risk factor for DVTs: Obesity Previous hypercoagulability panel: Negative, CT scans no occult malignancy  Continue indefinite anticoagulation therapy

## 2023-07-11 NOTE — Progress Notes (Signed)
 Patient Care Team: Abraham Abo, MD as PCP - General (Family Medicine) Andee Bamberger, NP as Nurse Practitioner (Gynecology) Cameron Cea, MD as Consulting Physician (Hematology and Oncology) Lindle Rhea, MD (Inactive) as Consulting Physician (Gastroenterology)  DIAGNOSIS:  Encounter Diagnoses  Name Primary?   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  She has experienced recurrent blood clots in the lungs and legs, initially treated with anticoagulation therapy. After stopping the medication, clots recurred in the legs, leading to the initiation of Xarelto  in 2018. She has no issues with Xarelto , although her pharmacy occasionally provides only a 30-day supply due to stock limitations. She incurs a $10 cost per prescription.  Her past iron deficiency anemia required iron infusions twice, linked to heavy menstrual cycles after a miscarriage and D&C. She is now postmenopausal, and her last blood work from the previous year indicated normal hemoglobin levels.     ALLERGIES:  is allergic to nuvaring [etonogestrel -ethinyl estradiol ] and codeine.  MEDICATIONS:  Current Outpatient Medications  Medication Sig Dispense Refill   atorvastatin  (LIPITOR) 10 MG tablet Take 1 tablet (10 mg total) by mouth daily. 90 tablet 3   celecoxib (CELEBREX) 100 MG capsule Take 100 mg by mouth 2 (two) times daily. (Patient not taking: Reported on 06/15/2023)     cetirizine  (ZYRTEC  ALLERGY) 10 MG tablet Take 1 tablet (10 mg total) by mouth daily. 30 tablet 0   cholecalciferol  (VITAMIN D3) 25 MCG (1000 UT) tablet Take 1,000 Units by mouth daily. Takes  400 units per day     dapagliflozin  propanediol (FARXIGA ) 10 MG TABS tablet Take 1 tablet (10 mg total) by mouth daily. 90 tablet 3   fluticasone  (FLONASE ) 50 MCG/ACT nasal spray Place 2 sprays into both nostrils daily. 9.9 mL 2   glipiZIDE  (GLUCOTROL ) 5 MG tablet Take 1 tablet (5 mg total) by mouth daily before breakfast. 90 tablet 3   rivaroxaban  (XARELTO ) 20 MG TABS tablet Take 1 tablet (20 mg total) by mouth daily with supper. 10 tablet 0   Vitamin D , Ergocalciferol , (DRISDOL ) 1.25 MG (50000 UNIT) CAPS capsule Take 1 capsule (50,000 Units total) by mouth every 7 (seven) days. (Patient not taking: Reported on 06/15/2023) 12 capsule 0   No current facility-administered medications for this visit.    PHYSICAL EXAMINATION: ECOG PERFORMANCE STATUS: 1 - Symptomatic but completely ambulatory  Vitals:   07/11/23 1058  BP: 120/62  Pulse: 91  Resp: 18  Temp: 97.9 F (36.6 C)  SpO2: 100%   Filed Weights   07/11/23 1058  Weight: 225 lb (102.1 kg)    LABORATORY DATA:  I have reviewed the data as listed    Latest Ref Rng & Units 01/05/2023   11:00 AM 07/09/2022   10:05 AM 04/02/2022    8:56 AM  CMP  Glucose 70 - 99 mg/dL 80  782  956   BUN 6 - 24 mg/dL 12  14  16    Creatinine 0.57 - 1.00 mg/dL 2.13  0.86  5.78   Sodium 134 - 144 mmol/L 144  140  141   Potassium 3.5 - 5.2 mmol/L 4.1  3.7  3.5  Chloride 96 - 106 mmol/L 106  106  105   CO2 20 - 29 mmol/L 21  28  27    Calcium  8.7 - 10.2 mg/dL 9.8  9.5  9.5   Total Protein 6.0 - 8.5 g/dL 6.6  7.4  6.9   Total Bilirubin 0.0 - 1.2 mg/dL 0.3  0.4  0.4   Alkaline Phos 44 - 121 IU/L 140  113  93   AST 0 - 40 IU/L 28  17  17    ALT 0 - 32 IU/L 41  28  18     Lab Results  Component Value Date   WBC 5.0 07/11/2023   HGB 12.2 07/11/2023   HCT 39.0 07/11/2023   MCV 87.2 07/11/2023   PLT 224 07/11/2023   NEUTROABS 3.2 07/11/2023    ASSESSMENT & PLAN:  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 Continued anticoagulation necessary due to recurrence risk. Discussed bleeding risks and inefficacy of lower Xarelto  dose. - Continue Xarelto  as prescribed. - Manage prescription refills through [facility] with a 30-day supply and 11 refills for a year.  Type 2 diabetes mellitus Type 2 diabetes mellitus managed with dietary modifications. - Encouraged to monitor dietary intake.  Obesity Actively working on weight loss with increased physical activity and dietary tracking. - Set specific physical activity goals. - Track caloric intake using an app. - Maintain a no-sugar diet.      No orders of the defined types were placed in this encounter.  The patient has a good understanding of the overall plan. she agrees with it. she will call with any problems that may develop before the next visit here. Total time spent: 30 mins including face to face time and time spent for planning, charting and co-ordination of care   Margert Sheerer, MD 07/11/23

## 2023-07-11 NOTE — Assessment & Plan Note (Signed)
 IV iron: December 2019, May 2020 09/24/2021: Upper endoscopy colonoscopy: Negative Lab review: 01/05/2023: Hemoglobin 13.5, MCV 88

## 2023-08-02 ENCOUNTER — Ambulatory Visit (INDEPENDENT_AMBULATORY_CARE_PROVIDER_SITE_OTHER): Payer: BC Managed Care – PPO | Admitting: Internal Medicine

## 2023-08-02 ENCOUNTER — Encounter: Payer: Self-pay | Admitting: Internal Medicine

## 2023-08-02 VITALS — BP 120/80 | HR 84 | Ht 60.6 in | Wt 223.0 lb

## 2023-08-02 DIAGNOSIS — E785 Hyperlipidemia, unspecified: Secondary | ICD-10-CM

## 2023-08-02 DIAGNOSIS — Z7984 Long term (current) use of oral hypoglycemic drugs: Secondary | ICD-10-CM

## 2023-08-02 DIAGNOSIS — E119 Type 2 diabetes mellitus without complications: Secondary | ICD-10-CM

## 2023-08-02 LAB — POCT GLYCOSYLATED HEMOGLOBIN (HGB A1C): Hemoglobin A1C: 6.3 % — AB (ref 4.0–5.6)

## 2023-08-02 MED ORDER — GLIPIZIDE 5 MG PO TABS: 5.0000 mg | ORAL_TABLET | Freq: Every day | ORAL | 3 refills | Status: DC

## 2023-08-02 MED ORDER — DAPAGLIFLOZIN PROPANEDIOL 10 MG PO TABS: 10.0000 mg | ORAL_TABLET | Freq: Every day | ORAL | 3 refills | Status: DC

## 2023-08-02 MED ORDER — ATORVASTATIN CALCIUM 10 MG PO TABS: 10.0000 mg | ORAL_TABLET | Freq: Every day | ORAL | 3 refills | Status: AC
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
## 2023-08-02 NOTE — Patient Instructions (Signed)
?-   Continue  Glipizide 5 mg , 1 tablet before Breakfast  ?- Continue Farxiga 10 mg ,1 tablet  every morning  ? ? ? ?HOW TO TREAT LOW BLOOD SUGARS (Blood sugar LESS THAN 70 MG/DL) ?Please follow the RULE OF 15 for the treatment of hypoglycemia treatment (when your (blood sugars are less than 70 mg/dL)  ? ?STEP 1: Take 15 grams of carbohydrates when your blood sugar is low, which includes:  ?3-4 GLUCOSE TABS  OR ?3-4 OZ OF JUICE OR REGULAR SODA OR ?ONE TUBE OF GLUCOSE GEL   ? ?STEP 2: RECHECK blood sugar in 15 MINUTES ?STEP 3: If your blood sugar is still low at the 15 minute recheck --> then, go back to STEP 1 and treat AGAIN with another 15 grams of carbohydrates. ? ?

## 2023-08-03 LAB — MICROALBUMIN / CREATININE URINE RATIO
Creatinine, Urine: 86 mg/dL (ref 20–275)
Microalb Creat Ratio: 13 mg/g{creat} (ref <30)
Microalb, Ur: 1.1 mg/dL

## 2023-08-05 ENCOUNTER — Ambulatory Visit: Payer: Self-pay | Admitting: Internal Medicine

## 2023-11-17 DIAGNOSIS — M25569 Pain in unspecified knee: Secondary | ICD-10-CM | POA: Diagnosis not present

## 2023-11-28 ENCOUNTER — Ambulatory Visit: Payer: BC Managed Care – PPO | Admitting: Nurse Practitioner

## 2023-11-28 NOTE — Progress Notes (Unsigned)
 Marissa [name] 21-Jan-1971 [id number]   History:  53 y.o. G2P0010 presents for annual exam. Postmenopausal. LMP June 2024. 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 Anemia managed by hematology, iron transfusions in the past. On Xarelto  for DVT/PE. T2DM managed by endocrinology. Has been struggling with weight gain. Feels she eats a pretty healthy diet. Not exercising. Requesting flu vaccine today. Up 26 pounds since October 2024.   Gynecologic History No LMP recorded. Patient is postmenopausal. LMP 07/24/2022 Contraception: abstinence Sexually active: No  Health maintenance Last Pap: 11/23/2021. Results were: Normal neg HPV Last mammogram: 01/28/2023. Results were: Normal  Last colonoscopy: 09/24/2021, 3-year recall Last Dexa: Not indicated     01/05/2023   10:38 AM  Depression screen PHQ 2/9  Decreased Interest 0  Down, Depressed, Hopeless 0  PHQ - 2 Score 0     Past medical history, past surgical history, family history and social history were all reviewed and documented in the EPIC chart. Single. Works from home for [employer].   ROS:  A ROS was performed and pertinent positives and negatives are included.  Exam:  Vitals:   11/29/23 0810  BP: 122/66  Pulse: 87  SpO2: 99%  Weight: 235 lb (106.6 kg)  Height: 5' 5.5 (1.664 m)      Body mass index is 38.51 kg/m.  General appearance:  Normal Thyroid :  Symmetrical, normal in size, without palpable masses or nodularity. Respiratory  Auscultation:  Clear without wheezing or rhonchi Cardiovascular  Auscultation:  Regular rate, without rubs, murmurs or gallops  Edema/varicosities:  Not grossly evident Abdominal  Soft,nontender, without masses, guarding or rebound.  Liver/spleen:  No organomegaly noted  Hernia:  None appreciated  Skin  Inspection:  Grossly normal   Breasts: Examined lying and sitting.   Right: Without masses, retractions, discharge or axillary adenopathy.   Left: Without masses, retractions, discharge or  axillary adenopathy. Pelvic: External genitalia:  no lesions              Urethra:  normal appearing urethra with no masses, tenderness or lesions              Bartholins and Skenes: normal                 Vagina: normal appearing vagina with normal color and discharge, no lesions              Cervix: no lesions Bimanual Exam:  Uterus:  no masses or tenderness              Adnexa: no mass, fullness, tenderness              Rectovaginal: Deferred              Anus:  normal, no lesions  Marissa [name], CMA present as chaperone.    Assessment/Plan:  53 y.o. G2P0010 for annual exam.   Well female exam with routine gynecological exam - Education provided on SBEs, importance of preventative screenings, current guidelines, high calcium  diet, regular exercise, and multivitamin daily.  Labs with endocrinology/hematology.   Postmenopausal - no HRT. LMP June 2024.  BMI 38.0-38.9,adult - Has been struggling with weight gain. Up 26 pounds since last October. Feels she eats a healthy diet. Not exercising. Discussed important of daily calorie deficit for weight loss, increasing exercise. Recommend discussing GLP-1 with endocrinology for weight and diabetes management. Did Ozempic  in the past, patient self discontinued due to worry of side effects.   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 Will repeat at 5-year interval per guidelines.   Screening for breast cancer - Normal mammogram history.  Continue annual screenings.  Normal breast exam today.  Screening for colon cancer - 09/2021 colonoscopy. Will repeat at GI's recommended interval.   Return in about 1 year (around 11/28/2024) for Annual.      Marissa [name] [facility], 8:31 AM 11/29/2023

## 2023-11-29 ENCOUNTER — Ambulatory Visit (INDEPENDENT_AMBULATORY_CARE_PROVIDER_SITE_OTHER): Admitting: Nurse Practitioner

## 2023-11-29 ENCOUNTER — Encounter: Payer: Self-pay | Admitting: Nurse Practitioner

## 2023-11-29 VITALS — BP 122/66 | HR 87 | Ht 65.5 in | Wt 235.0 lb

## 2023-11-29 DIAGNOSIS — Z1331 Encounter for screening for depression: Secondary | ICD-10-CM | POA: Diagnosis not present

## 2023-11-29 DIAGNOSIS — Z23 Encounter for immunization: Secondary | ICD-10-CM | POA: Diagnosis not present

## 2023-11-29 DIAGNOSIS — Z6838 Body mass index (BMI) 38.0-38.9, adult: Secondary | ICD-10-CM

## 2023-11-29 DIAGNOSIS — Z01419 Encounter for gynecological examination (general) (routine) without abnormal findings: Secondary | ICD-10-CM

## 2023-11-29 DIAGNOSIS — Z780 Asymptomatic menopausal state: Secondary | ICD-10-CM | POA: Diagnosis not present

## 2023-11-29 DIAGNOSIS — N951 Menopausal and female climacteric states: Secondary | ICD-10-CM

## 2023-12-06 ENCOUNTER — Ambulatory Visit: Admitting: Family Medicine

## 2023-12-09 ENCOUNTER — Encounter: Payer: Self-pay | Admitting: Family Medicine

## 2023-12-09 ENCOUNTER — Ambulatory Visit: Admitting: Family Medicine

## 2023-12-09 VITALS — BP 119/80 | HR 76 | Ht 65.5 in | Wt 236.6 lb

## 2023-12-09 DIAGNOSIS — M5442 Lumbago with sciatica, left side: Secondary | ICD-10-CM | POA: Diagnosis not present

## 2023-12-09 DIAGNOSIS — R21 Rash and other nonspecific skin eruption: Secondary | ICD-10-CM

## 2023-12-09 DIAGNOSIS — M5441 Lumbago with sciatica, right side: Secondary | ICD-10-CM

## 2023-12-09 MED ORDER — TRIAMCINOLONE ACETONIDE 0.1 % EX CREA: 1.0000 | TOPICAL_CREAM | Freq: Two times a day (BID) | CUTANEOUS | 0 refills | Status: AC
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
## 2023-12-12 ENCOUNTER — Encounter: Payer: Self-pay | Admitting: Family Medicine

## 2024-01-02 ENCOUNTER — Other Ambulatory Visit: Payer: Self-pay | Admitting: Family Medicine

## 2024-01-02 DIAGNOSIS — Z1231 Encounter for screening mammogram for malignant neoplasm of breast: Secondary | ICD-10-CM

## 2024-02-01 ENCOUNTER — Ambulatory Visit
Admission: RE | Admit: 2024-02-01 | Discharge: 2024-02-01 | Disposition: A | Discharge status: Home / Self Care | Source: Ambulatory Visit

## 2024-02-01 ENCOUNTER — Ambulatory Visit: Admitting: Internal Medicine

## 2024-02-01 ENCOUNTER — Encounter: Payer: Self-pay | Admitting: Internal Medicine

## 2024-02-01 VITALS — BP 138/84 | Ht 65.5 in | Wt 234.0 lb

## 2024-02-01 DIAGNOSIS — E119 Type 2 diabetes mellitus without complications: Secondary | ICD-10-CM

## 2024-02-01 DIAGNOSIS — Z7984 Long term (current) use of oral hypoglycemic drugs: Secondary | ICD-10-CM | POA: Diagnosis not present

## 2024-02-01 DIAGNOSIS — E785 Hyperlipidemia, unspecified: Secondary | ICD-10-CM | POA: Diagnosis not present

## 2024-02-01 DIAGNOSIS — Z1231 Encounter for screening mammogram for malignant neoplasm of breast: Secondary | ICD-10-CM | POA: Diagnosis not present

## 2024-02-01 LAB — POCT GLYCOSYLATED HEMOGLOBIN (HGB A1C): Hemoglobin A1C: 6.7 % — AB (ref 4.0–5.6)

## 2024-02-01 MED ORDER — DAPAGLIFLOZIN PROPANEDIOL 10 MG PO TABS: 10.0000 mg | ORAL_TABLET | Freq: Every day | ORAL | 3 refills | Status: DC

## 2024-02-01 MED ORDER — DAPAGLIFLOZIN PROPANEDIOL 10 MG PO TABS: 10.0000 mg | ORAL_TABLET | Freq: Every day | ORAL | 3 refills | Status: AC

## 2024-02-01 MED ORDER — GLIPIZIDE 5 MG PO TABS: 5.0000 mg | ORAL_TABLET | Freq: Every day | ORAL | 3 refills | Status: AC

## 2024-02-01 NOTE — Patient Instructions (Addendum)
°-   Take  Glipizide  5 mg, 1 tablet before Breakfast  - Continue Farxiga  10 mg, 1 tablet every morning     HOW TO TREAT LOW BLOOD SUGARS (Blood sugar LESS THAN 70 MG/DL) Please follow the RULE OF 15 for the treatment of hypoglycemia treatment (when your (blood sugars are less than 70 mg/dL)   STEP 1: Take 15 grams of carbohydrates when your blood sugar is low, which includes:  3-4 GLUCOSE TABS  OR 3-4 OZ OF JUICE OR REGULAR SODA OR ONE TUBE OF GLUCOSE GEL    STEP 2: RECHECK blood sugar in 15 MINUTES STEP 3: If your blood sugar is still low at the 15 minute recheck --> then, go back to STEP 1 and treat AGAIN with another 15 grams of carbohydrates.
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
## 2024-02-07 ENCOUNTER — Ambulatory Visit: Payer: Self-pay | Admitting: Family Medicine

## 2024-02-07 NOTE — Progress Notes (Signed)
 Patient reviewed lab results and provider recommendations via MyChart

## 2024-02-17 ENCOUNTER — Encounter: Payer: Self-pay | Admitting: Family Medicine

## 2024-02-17 ENCOUNTER — Other Ambulatory Visit: Payer: Self-pay | Admitting: Family Medicine

## 2024-02-17 MED ORDER — PROMETHAZINE-DM 6.25-15 MG/5ML PO SYRP: 5.0000 mL | ORAL_SOLUTION | Freq: Four times a day (QID) | ORAL | 0 refills | Status: AC | PRN

## 2024-03-30 ENCOUNTER — Encounter: Payer: Self-pay | Admitting: Family Medicine

## 2024-07-10 ENCOUNTER — Ambulatory Visit: Admitting: Hematology and Oncology

## 2024-07-10 ENCOUNTER — Other Ambulatory Visit

## 2024-08-01 ENCOUNTER — Ambulatory Visit: Admitting: Internal Medicine

## 2024-11-29 ENCOUNTER — Ambulatory Visit: Admitting: Nurse Practitioner
